# Patient Record
Sex: Female | Born: 1965 | Race: White | Hispanic: No | Marital: Married | Smoking: Former smoker
Health system: Southern US, Community
[De-identification: ages and names within clinical notes are randomized; demographics above are authoritative.]

## PROBLEM LIST (undated history)

## (undated) DIAGNOSIS — I479 Paroxysmal tachycardia, unspecified: Secondary | ICD-10-CM

## (undated) DIAGNOSIS — F419 Anxiety disorder, unspecified: Secondary | ICD-10-CM

## (undated) DIAGNOSIS — E785 Hyperlipidemia, unspecified: Secondary | ICD-10-CM

## (undated) DIAGNOSIS — T7840XA Allergy, unspecified, initial encounter: Secondary | ICD-10-CM

## (undated) DIAGNOSIS — K219 Gastro-esophageal reflux disease without esophagitis: Secondary | ICD-10-CM

## (undated) DIAGNOSIS — Z86010 Personal history of colonic polyps: Secondary | ICD-10-CM

## (undated) DIAGNOSIS — R739 Hyperglycemia, unspecified: Secondary | ICD-10-CM

## (undated) HISTORY — PX: OTHER SURGICAL HISTORY: SHX169

## (undated) HISTORY — DX: Personal history of colonic polyps: Z86.010

## (undated) HISTORY — DX: Paroxysmal tachycardia, unspecified: I47.9

## (undated) HISTORY — DX: Gastro-esophageal reflux disease without esophagitis: K21.9

## (undated) HISTORY — DX: Allergy, unspecified, initial encounter: T78.40XA

## (undated) HISTORY — DX: Hyperlipidemia, unspecified: E78.5

## (undated) HISTORY — DX: Anxiety disorder, unspecified: F41.9

## (undated) HISTORY — DX: Hyperglycemia, unspecified: R73.9

---

## 1997-05-26 ENCOUNTER — Other Ambulatory Visit: Admission: RE | Admit: 1997-05-26 | Discharge: 1997-05-26 | Payer: Self-pay | Admitting: Obstetrics & Gynecology

## 1998-06-27 ENCOUNTER — Other Ambulatory Visit: Admission: RE | Admit: 1998-06-27 | Discharge: 1998-06-27 | Payer: Self-pay | Admitting: Obstetrics & Gynecology

## 2000-02-17 ENCOUNTER — Other Ambulatory Visit: Admission: RE | Admit: 2000-02-17 | Discharge: 2000-02-17 | Payer: Self-pay | Admitting: Obstetrics & Gynecology

## 2002-01-20 HISTORY — PX: ABDOMINAL HYSTERECTOMY: SHX81

## 2002-02-22 ENCOUNTER — Other Ambulatory Visit: Admission: RE | Admit: 2002-02-22 | Discharge: 2002-02-22 | Payer: Self-pay | Admitting: Obstetrics & Gynecology

## 2002-05-06 ENCOUNTER — Inpatient Hospital Stay (HOSPITAL_COMMUNITY): Admission: RE | Admit: 2002-05-06 | Discharge: 2002-05-08 | Payer: Self-pay | Admitting: Obstetrics & Gynecology

## 2002-05-06 ENCOUNTER — Encounter (INDEPENDENT_AMBULATORY_CARE_PROVIDER_SITE_OTHER): Payer: Self-pay

## 2006-10-08 ENCOUNTER — Ambulatory Visit: Payer: Self-pay | Admitting: Internal Medicine

## 2006-11-19 ENCOUNTER — Ambulatory Visit: Payer: Self-pay | Admitting: Internal Medicine

## 2006-12-31 ENCOUNTER — Ambulatory Visit: Payer: Self-pay | Admitting: Internal Medicine

## 2008-01-21 DIAGNOSIS — Z860100 Personal history of colon polyps, unspecified: Secondary | ICD-10-CM

## 2008-01-21 HISTORY — DX: Personal history of colon polyps, unspecified: Z86.0100

## 2008-01-21 HISTORY — DX: Personal history of colonic polyps: Z86.010

## 2008-09-14 ENCOUNTER — Encounter (INDEPENDENT_AMBULATORY_CARE_PROVIDER_SITE_OTHER): Payer: Self-pay | Admitting: *Deleted

## 2008-09-27 ENCOUNTER — Ambulatory Visit: Payer: Self-pay | Admitting: Internal Medicine

## 2008-09-27 ENCOUNTER — Telehealth: Payer: Self-pay | Admitting: Internal Medicine

## 2008-09-27 DIAGNOSIS — J309 Allergic rhinitis, unspecified: Secondary | ICD-10-CM | POA: Insufficient documentation

## 2008-09-27 DIAGNOSIS — J45909 Unspecified asthma, uncomplicated: Secondary | ICD-10-CM

## 2008-09-27 DIAGNOSIS — R739 Hyperglycemia, unspecified: Secondary | ICD-10-CM | POA: Insufficient documentation

## 2008-09-27 DIAGNOSIS — E782 Mixed hyperlipidemia: Secondary | ICD-10-CM

## 2008-09-28 ENCOUNTER — Encounter (INDEPENDENT_AMBULATORY_CARE_PROVIDER_SITE_OTHER): Payer: Self-pay | Admitting: *Deleted

## 2008-09-28 LAB — CONVERTED CEMR LAB
ALT: 19 units/L (ref 0–35)
AST: 22 units/L (ref 0–37)
Albumin: 4.1 g/dL (ref 3.5–5.2)
Alkaline Phosphatase: 70 units/L (ref 39–117)
BUN: 9 mg/dL (ref 6–23)
Basophils Absolute: 0 10*3/uL (ref 0.0–0.1)
Basophils Relative: 0.2 % (ref 0.0–3.0)
Bilirubin, Direct: 0 mg/dL (ref 0.0–0.3)
CO2: 29 meq/L (ref 19–32)
Calcium: 9.1 mg/dL (ref 8.4–10.5)
Chloride: 105 meq/L (ref 96–112)
Creatinine, Ser: 0.7 mg/dL (ref 0.4–1.2)
Eosinophils Absolute: 0.3 10*3/uL (ref 0.0–0.7)
Eosinophils Relative: 3 % (ref 0.0–5.0)
GFR calc non Af Amer: 96.68 mL/min (ref >60)
Glucose, Bld: 80 mg/dL (ref 70–99)
HCT: 45.2 % (ref 36.0–46.0)
Hemoglobin: 15.5 g/dL — ABNORMAL HIGH (ref 12.0–15.0)
Hgb A1c MFr Bld: 5.9 % (ref 4.6–6.5)
Lymphocytes Relative: 20.8 % (ref 12.0–46.0)
Lymphs Abs: 2.2 10*3/uL (ref 0.7–4.0)
MCHC: 34.2 g/dL (ref 30.0–36.0)
MCV: 95.3 fL (ref 78.0–100.0)
Monocytes Absolute: 0.4 10*3/uL (ref 0.1–1.0)
Monocytes Relative: 4.1 % (ref 3.0–12.0)
Neutro Abs: 7.6 10*3/uL (ref 1.4–7.7)
Neutrophils Relative %: 71.9 % (ref 43.0–77.0)
Platelets: 341 10*3/uL (ref 150.0–400.0)
Potassium: 4.2 meq/L (ref 3.5–5.1)
RBC: 4.75 M/uL (ref 3.87–5.11)
RDW: 13.9 % (ref 11.5–14.6)
Sodium: 141 meq/L (ref 135–145)
TSH: 0.46 microintl units/mL (ref 0.35–5.50)
Total Bilirubin: 0.7 mg/dL (ref 0.3–1.2)
Total Protein: 7.6 g/dL (ref 6.0–8.3)
WBC: 10.5 10*3/uL (ref 4.5–10.5)

## 2008-10-03 ENCOUNTER — Encounter: Payer: Self-pay | Admitting: Internal Medicine

## 2008-10-09 ENCOUNTER — Telehealth (INDEPENDENT_AMBULATORY_CARE_PROVIDER_SITE_OTHER): Payer: Self-pay | Admitting: *Deleted

## 2008-10-10 ENCOUNTER — Telehealth (INDEPENDENT_AMBULATORY_CARE_PROVIDER_SITE_OTHER): Payer: Self-pay | Admitting: *Deleted

## 2008-11-13 ENCOUNTER — Encounter: Payer: Self-pay | Admitting: Internal Medicine

## 2008-11-13 ENCOUNTER — Emergency Department (HOSPITAL_BASED_OUTPATIENT_CLINIC_OR_DEPARTMENT_OTHER): Admission: EM | Admit: 2008-11-13 | Discharge: 2008-11-14 | Payer: Self-pay | Admitting: Emergency Medicine

## 2008-11-14 ENCOUNTER — Ambulatory Visit: Payer: Self-pay | Admitting: Diagnostic Radiology

## 2008-11-14 ENCOUNTER — Encounter: Payer: Self-pay | Admitting: Internal Medicine

## 2008-11-15 ENCOUNTER — Telehealth: Payer: Self-pay | Admitting: Internal Medicine

## 2008-11-17 ENCOUNTER — Ambulatory Visit: Payer: Self-pay | Admitting: Internal Medicine

## 2008-11-17 DIAGNOSIS — K625 Hemorrhage of anus and rectum: Secondary | ICD-10-CM | POA: Insufficient documentation

## 2008-11-17 DIAGNOSIS — N83209 Unspecified ovarian cyst, unspecified side: Secondary | ICD-10-CM

## 2008-11-17 DIAGNOSIS — D72829 Elevated white blood cell count, unspecified: Secondary | ICD-10-CM | POA: Insufficient documentation

## 2008-11-20 ENCOUNTER — Ambulatory Visit: Payer: Self-pay | Admitting: Internal Medicine

## 2008-11-20 HISTORY — PX: COLONOSCOPY W/ POLYPECTOMY: SHX1380

## 2008-11-21 ENCOUNTER — Encounter (INDEPENDENT_AMBULATORY_CARE_PROVIDER_SITE_OTHER): Payer: Self-pay | Admitting: *Deleted

## 2008-11-21 LAB — CONVERTED CEMR LAB
Basophils Absolute: 0.2 10*3/uL — ABNORMAL HIGH (ref 0.0–0.1)
Basophils Relative: 1.5 % (ref 0.0–3.0)
Eosinophils Absolute: 0.4 10*3/uL (ref 0.0–0.7)
Lymphocytes Relative: 20.9 % (ref 12.0–46.0)
MCHC: 33.8 g/dL (ref 30.0–36.0)
Monocytes Relative: 4.3 % (ref 3.0–12.0)
Neutrophils Relative %: 69.9 % (ref 43.0–77.0)
RBC: 4.81 M/uL (ref 3.87–5.11)
RDW: 14.4 % (ref 11.5–14.6)

## 2008-11-27 ENCOUNTER — Telehealth (INDEPENDENT_AMBULATORY_CARE_PROVIDER_SITE_OTHER): Payer: Self-pay | Admitting: *Deleted

## 2008-12-06 ENCOUNTER — Ambulatory Visit (HOSPITAL_COMMUNITY): Admission: RE | Admit: 2008-12-06 | Discharge: 2008-12-06 | Payer: Self-pay | Admitting: Gastroenterology

## 2009-01-03 ENCOUNTER — Encounter: Payer: Self-pay | Admitting: Internal Medicine

## 2009-01-31 ENCOUNTER — Encounter: Payer: Self-pay | Admitting: Internal Medicine

## 2009-02-13 ENCOUNTER — Ambulatory Visit: Payer: Self-pay | Admitting: Internal Medicine

## 2009-02-13 HISTORY — PX: WISDOM TOOTH EXTRACTION: SHX21

## 2009-02-16 LAB — CONVERTED CEMR LAB
ALT: 20 units/L (ref 0–35)
AST: 21 units/L (ref 0–37)
Albumin: 3.9 g/dL (ref 3.5–5.2)
HDL: 37.9 mg/dL — ABNORMAL LOW (ref >39.00)
Triglycerides: 152 mg/dL — ABNORMAL HIGH (ref 0.0–149.0)

## 2009-02-19 ENCOUNTER — Encounter (INDEPENDENT_AMBULATORY_CARE_PROVIDER_SITE_OTHER): Payer: Self-pay | Admitting: *Deleted

## 2009-02-19 ENCOUNTER — Ambulatory Visit: Payer: Self-pay | Admitting: Internal Medicine

## 2009-02-19 LAB — CONVERTED CEMR LAB: Inflenza A Ag: NEGATIVE

## 2009-02-20 ENCOUNTER — Ambulatory Visit: Payer: Self-pay | Admitting: Internal Medicine

## 2009-02-20 ENCOUNTER — Telehealth: Payer: Self-pay | Admitting: Internal Medicine

## 2009-02-21 ENCOUNTER — Telehealth (INDEPENDENT_AMBULATORY_CARE_PROVIDER_SITE_OTHER): Payer: Self-pay | Admitting: *Deleted

## 2009-02-22 ENCOUNTER — Telehealth: Payer: Self-pay | Admitting: Internal Medicine

## 2009-04-16 ENCOUNTER — Telehealth (INDEPENDENT_AMBULATORY_CARE_PROVIDER_SITE_OTHER): Payer: Self-pay | Admitting: *Deleted

## 2009-06-04 ENCOUNTER — Encounter: Payer: Self-pay | Admitting: Internal Medicine

## 2009-06-11 ENCOUNTER — Ambulatory Visit: Payer: Self-pay | Admitting: Family Medicine

## 2009-06-13 ENCOUNTER — Telehealth (INDEPENDENT_AMBULATORY_CARE_PROVIDER_SITE_OTHER): Payer: Self-pay | Admitting: *Deleted

## 2009-06-13 LAB — CONVERTED CEMR LAB
Basophils Absolute: 0 10*3/uL (ref 0.0–0.1)
Chloride: 106 meq/L (ref 96–112)
Creatinine, Ser: 0.6 mg/dL (ref 0.4–1.2)
Eosinophils Absolute: 0.1 10*3/uL (ref 0.0–0.7)
Lymphocytes Relative: 42.9 % (ref 12.0–46.0)
MCHC: 33.9 g/dL (ref 30.0–36.0)
Neutrophils Relative %: 39.1 % — ABNORMAL LOW (ref 43.0–77.0)
Platelets: 304 10*3/uL (ref 150.0–400.0)
Potassium: 4.4 meq/L (ref 3.5–5.1)
RDW: 14.5 % (ref 11.5–14.6)
Sodium: 144 meq/L (ref 135–145)

## 2009-06-27 ENCOUNTER — Telehealth (INDEPENDENT_AMBULATORY_CARE_PROVIDER_SITE_OTHER): Payer: Self-pay | Admitting: *Deleted

## 2009-09-12 ENCOUNTER — Encounter: Payer: Self-pay | Admitting: Internal Medicine

## 2009-10-11 ENCOUNTER — Encounter: Payer: Self-pay | Admitting: Internal Medicine

## 2009-12-04 ENCOUNTER — Telehealth (INDEPENDENT_AMBULATORY_CARE_PROVIDER_SITE_OTHER): Payer: Self-pay | Admitting: *Deleted

## 2010-01-17 ENCOUNTER — Telehealth: Payer: Self-pay | Admitting: Internal Medicine

## 2010-01-28 ENCOUNTER — Ambulatory Visit: Admit: 2010-01-28 | Payer: Self-pay | Admitting: Internal Medicine

## 2010-02-04 ENCOUNTER — Ambulatory Visit
Admission: RE | Admit: 2010-02-04 | Discharge: 2010-02-04 | Payer: Self-pay | Source: Home / Self Care | Attending: Internal Medicine | Admitting: Internal Medicine

## 2010-02-04 ENCOUNTER — Encounter: Payer: Self-pay | Admitting: Internal Medicine

## 2010-02-04 ENCOUNTER — Other Ambulatory Visit: Payer: Self-pay | Admitting: Internal Medicine

## 2010-02-04 DIAGNOSIS — Z860100 Personal history of colon polyps, unspecified: Secondary | ICD-10-CM | POA: Insufficient documentation

## 2010-02-04 DIAGNOSIS — I472 Ventricular tachycardia: Secondary | ICD-10-CM | POA: Insufficient documentation

## 2010-02-04 LAB — BASIC METABOLIC PANEL
BUN: 5 mg/dL — ABNORMAL LOW (ref 6–23)
CO2: 26 mEq/L (ref 19–32)
Calcium: 8.9 mg/dL (ref 8.4–10.5)
Chloride: 102 mEq/L (ref 96–112)
Creatinine, Ser: 0.7 mg/dL (ref 0.4–1.2)
GFR: 96.08 mL/min (ref >60.00)
Glucose, Bld: 95 mg/dL (ref 70–99)
Potassium: 4 mEq/L (ref 3.5–5.1)
Sodium: 135 mEq/L (ref 135–145)

## 2010-02-04 LAB — CBC WITH DIFFERENTIAL/PLATELET
Basophils Absolute: 0 10*3/uL (ref 0.0–0.1)
Basophils Relative: 0 % (ref 0.0–3.0)
Eosinophils Absolute: 0.2 10*3/uL (ref 0.0–0.7)
Eosinophils Relative: 2 % (ref 0.0–5.0)
HCT: 46.8 % — ABNORMAL HIGH (ref 36.0–46.0)
Hemoglobin: 15.7 g/dL — ABNORMAL HIGH (ref 12.0–15.0)
Lymphocytes Relative: 18.4 % (ref 12.0–46.0)
Lymphs Abs: 2.1 10*3/uL (ref 0.7–4.0)
MCHC: 33.6 g/dL (ref 30.0–36.0)
MCV: 97.2 fl (ref 78.0–100.0)
Monocytes Absolute: 0.6 10*3/uL (ref 0.1–1.0)
Monocytes Relative: 5.4 % (ref 3.0–12.0)
Neutro Abs: 8.6 10*3/uL — ABNORMAL HIGH (ref 1.4–7.7)
Neutrophils Relative %: 74.2 % (ref 43.0–77.0)
Platelets: 325 10*3/uL (ref 150.0–400.0)
RBC: 4.81 Mil/uL (ref 3.87–5.11)
RDW: 14 % (ref 11.5–14.6)
WBC: 11.6 10*3/uL — ABNORMAL HIGH (ref 4.5–10.5)

## 2010-02-04 LAB — LIPID PANEL
Cholesterol: 191 mg/dL (ref 0–200)
HDL: 36 mg/dL — ABNORMAL LOW (ref >39.00)
LDL Cholesterol: 118 mg/dL — ABNORMAL HIGH (ref 0–99)
Total CHOL/HDL Ratio: 5
Triglycerides: 185 mg/dL — ABNORMAL HIGH (ref 0.0–149.0)
VLDL: 37 mg/dL (ref 0.0–40.0)

## 2010-02-04 LAB — HEPATIC FUNCTION PANEL
ALT: 17 U/L (ref 0–35)
AST: 24 U/L (ref 0–37)
Albumin: 4 g/dL (ref 3.5–5.2)
Alkaline Phosphatase: 52 U/L (ref 39–117)
Bilirubin, Direct: 0.1 mg/dL (ref 0.0–0.3)
Total Bilirubin: 0.4 mg/dL (ref 0.3–1.2)
Total Protein: 6.7 g/dL (ref 6.0–8.3)

## 2010-02-04 LAB — TSH: TSH: 0.71 u[IU]/mL (ref 0.35–5.50)

## 2010-02-11 ENCOUNTER — Ambulatory Visit
Admission: RE | Admit: 2010-02-11 | Discharge: 2010-02-11 | Payer: Self-pay | Source: Home / Self Care | Attending: Internal Medicine | Admitting: Internal Medicine

## 2010-02-11 DIAGNOSIS — B029 Zoster without complications: Secondary | ICD-10-CM | POA: Insufficient documentation

## 2010-02-11 DIAGNOSIS — D485 Neoplasm of uncertain behavior of skin: Secondary | ICD-10-CM | POA: Insufficient documentation

## 2010-02-19 ENCOUNTER — Telehealth (INDEPENDENT_AMBULATORY_CARE_PROVIDER_SITE_OTHER): Payer: Self-pay | Admitting: *Deleted

## 2010-02-21 NOTE — Letter (Signed)
Summary: [facility]  [facility]   Imported By: Lanelle Bal 06/23/2009 09:49:23  _____________________________________________________________________  External Attachment:    Type:   Image     Comment:   External Document

## 2010-02-21 NOTE — Progress Notes (Signed)
Summary: Refill Request  Phone Note Refill Request Call back at [phone] Message from:  Pharmacy on June 27, 2009 8:48 AM  Refills Requested: Medication #1:  CLONAZEPAM 0.5 MG TBDP 1/2 by mouth as needed ANXIETY   Dosage confirmed as above?Dosage Confirmed   Supply Requested: 1 month   [facility]  Next Appointment Scheduled: none Initial call taken by: Harold Barban,  June 27, 2009 8:49 AM    Prescriptions: CLONAZEPAM 0.5 MG TBDP (CLONAZEPAM) 1/2 by mouth as needed ANXIETY  #90 x 0   Entered by:   Shonna Chock   Authorized by:   Marga Melnick MD   Signed by:   Shonna Chock on 06/27/2009   Method used:   Printed then faxed to ...       [facility] [phone]* (retail)       [address]       [place], [place]  [address]       Ph: [phone]       Fax: [phone]   RxID:   [id number]

## 2010-02-21 NOTE — Letter (Signed)
Summary: [facility]  [facility]   Imported By: Lanelle Bal 09/25/2009 11:11:56  _____________________________________________________________________  External Attachment:    Type:   Image     Comment:   External Document

## 2010-02-21 NOTE — Progress Notes (Signed)
Summary: Chest XRay results  Phone Note Call from Patient Call back at Home Phone [phone]   Caller: Patient Summary of Call: Message left on VM: Patient would like Xray results Spoke with patient and discussed: Changes of bronchitis; hyperinflation from asthma mimics COPD / emphysema.No Pneumonia .Prednisone 20 mg two times a day #14 if asthma not resolving. Hopp  Patient still with asthma SX, RX for prednisone sent. Patient said she also has a nebulizer but no meds Please ask Dr.Hopper is he will dispense meds. Per Dr.Hopper: Geronimo Boot 1amp every 4 hours as needed    [facility] [address] Initial call taken by: Shonna Chock,  February 21, 2009 3:58 PM    New/Updated Medications: DUONEB 0.5-2.5 (3) MG/3ML SOLN (IPRATROPIUM-ALBUTEROL) 1 Ampule every 4 hours as needed PREDNISONE 20 MG TABS (PREDNISONE) 20 mg two times a day Prescriptions: PREDNISONE 20 MG TABS (PREDNISONE) 20 mg two times a day  #14 x 0   Entered by:   Shonna Chock   Authorized by:   Marga Melnick MD   Signed by:   Shonna Chock on 02/21/2009   Method used:   Electronically to        [facility] [phone]* (retail)       [address]       [place], [place]  [address]       Ph: [phone]       Fax: [phone]   RxID:   [id number] DUONEB 0.5-2.5 (3) MG/3ML SOLN (IPRATROPIUM-ALBUTEROL) 1 Ampule every 4 hours as needed  #51mo supply x 2   Entered by:   Shonna Chock   Authorized by:   Marga Melnick MD   Signed by:   Shonna Chock on 02/21/2009   Method used:   Electronically to        [facility] [phone]* (retail)       [address]       [place], [place]  [address]       Ph: [phone]       Fax: [phone]   RxID:   [id number]

## 2010-02-21 NOTE — Assessment & Plan Note (Signed)
Summary: shingles//lch   Vital Signs:  Patient profile:   45 year old female Weight:      178 pounds BMI:     30.43 Temp:     98.5 degrees F oral Pulse rate:   72 / minute Resp:     15 per minute BP sitting:   134 / 88  (left arm) Cuff size:   large  Vitals Entered By: Shonna Chock CMA (February 11, 2010 2:47 PM) CC: 1.) ? shingles, patient c/o burning sensation (right pelvic area), onset Saturday and by Sunday sensation moved around to right buttock area and now patient notices spots (right pelvic area).  2.) Examine 2 spots on right leg (non-related to concern #1)    CC:  1.) ? shingles, patient c/o burning sensation (right pelvic area), and onset Saturday and by Sunday sensation moved around to right buttock area and now patient notices spots (right pelvic area).  2.) Examine 2 spots on right leg (non-related to concern #1) .  History of Present Illness:    Rash as "bumps " today  in R pubic arepubic area . The initial symptom was  burning 01/21 in R  inguinal & pelvic area and in R thigh & R LS area. Lesions on leg present for years, both  lesions intermittently scale over . Both have enlarged over past year.  No FH or PMH of  skin cancer.  Current Medications (verified): 1)  Atenolol 50 Mg Tabs (Atenolol) .Marland Kitchen.. 1 By Mouth Two Times A Day 2)  Citalopram Hydrobromide 20 Mg Tabs (Citalopram Hydrobromide) .Marland Kitchen.. 1 By Mouth Am 3)  Proair Hfa 108 (90 Base) Mcg/act Aers (Albuterol Sulfate) .... As Needed 4)  Clonazepam 0.5 Mg Tbdp (Clonazepam) .... 1/2-1 By Mouth Every 8-12 Hours As Needed 5)  Pravastatin Sodium 20 Mg Tabs (Pravastatin Sodium) .Marland Kitchen.. 1 At Bedtime 6)  Hydrocodone-Acetaminophen 10-325 Mg Tabs (Hydrocodone-Acetaminophen) .Marland Kitchen.. 1 By Mouth Every 4-6 Hours As Needed 7)  Duoneb 0.5-2.5 (3) Mg/8ml Soln (Ipratropium-Albuterol) .Marland Kitchen.. 1 Ampule Every 4 Hours As Needed  Allergies (verified): No Known Drug Allergies  Review of Systems General:  Denies chills, fever, sweats, and weight  loss. Derm:  Complains of dryness; denies itching.  Physical Exam  General:  well-nourished,in no acute distress; alert  and cooperative throughout examination Skin:  faint small papules R pubic area . 10X 8 mm granuloma & 5X4 mm keratosis R thigh Inguinal Nodes:  No significant adenopathy   Impression & Recommendations:  Problem # 1:  HERPES ZOSTER (ICD-053.9) R L-1 distribution; pathophysiology of herpes zoster  Problem # 2:  NEOPLASM, SKIN, UNCERTAIN BEHAVIOR (ICD-238.2)  Granuloma & keratosis suggested clinically  Orders: Dermatology Referral (Derma)  Complete Medication List: 1)  Atenolol 50 Mg Tabs (Atenolol) .Marland Kitchen.. 1 by mouth two times a day 2)  Citalopram Hydrobromide 20 Mg Tabs (Citalopram hydrobromide) .Marland Kitchen.. 1 by mouth am 3)  Proair Hfa 108 (90 Base) Mcg/act Aers (Albuterol sulfate) .... As needed 4)  Clonazepam 0.5 Mg Tbdp (Clonazepam) .... 1/2-1 by mouth every 8-12 hours as needed 5)  Pravastatin Sodium 20 Mg Tabs (Pravastatin sodium) .Marland Kitchen.. 1 at bedtime 6)  Hydrocodone-acetaminophen 10-325 Mg Tabs (Hydrocodone-acetaminophen) .Marland Kitchen.. 1 by mouth every 4-6 hours as needed 7)  Duoneb 0.5-2.5 (3) Mg/64ml Soln (Ipratropium-albuterol) .Marland Kitchen.. 1 ampule every 4 hours as needed 8)  Valacyclovir Hcl 500 Mg Tabs (Valacyclovir hcl) .Marland Kitchen.. 1 three times a day 9)  Gabapentin 100 Mg Caps (Gabapentin) .Marland Kitchen.. 1 every 8 hrs as needed for burning pain  Patient Instructions: 1)  Simple cleansing of lesions in groin. Prescriptions: GABAPENTIN 100 MG CAPS (GABAPENTIN) 1 every 8 hrs as needed for burning pain  #30 x 1   Entered and Authorized by:   Marga Melnick MD   Signed by:   Marga Melnick MD on 02/11/2010   Method used:   Print then Give to Patient   RxID:   [id number] VALACYCLOVIR HCL 500 MG TABS (VALACYCLOVIR HCL) 1 three times a day  #21 x 0   Entered and Authorized by:   Marga Melnick MD   Signed by:   Marga Melnick MD on 02/11/2010   Method used:   Electronically to        [facility] [phone]* (retail)       [address]       [place], [place]  [address]       Ph: [phone]       Fax: [phone]   RxID:   [id number]    Orders Added: 1)  Est. Patient Level III [63[address]] 2)  Dermatology Referral [Derma]

## 2010-02-21 NOTE — Assessment & Plan Note (Signed)
Summary: FLU LIKE SYMPTOMS/RH......Marland Kitchen   Vital Signs:  Patient profile:   45 year old female Weight:      180.2 pounds Temp:     99.3 degrees F oral Pulse rate:   88 / minute Resp:     15 per minute BP sitting:   124 / 82  (left arm) Cuff size:   large  Vitals Entered By: Shonna Chock (February 19, 2009 2:59 PM) CC: Fever and cough (pain due to cough). Patient's daughter seen the PA this am and was DX with the flu Comments REVIEWED MED LIST, PATIENT AGREED DOSE AND INSTRUCTION CORRECT    CC:  Fever and cough (pain due to cough). Patient's daughter seen the PA this am and was DX with the flu.  History of Present Illness: Onset 02/18/18 as dry cough but by night  scant yellow sputum after   paroxysms  of  cough. Daughter had + flu  test 01/28;husband hospitalized with gastroenteritis 01/29-30. Now she has  fever , back pain  with cough, wheezing & cough incontinence. PMH of asthma; Symbicort 1 puff two times a day , no rescue inhaler. No flu shot taken to date.                                                                                                         Lipid  results & prior NMR Lipoprofile  reviewed; approx 50 % reduction in risk with diet changes & statin.  Allergies (verified): No Known Drug Allergies  Past History:  Past Medical History: Paroxysmal tachycardia, Dr Graciela Husbands  Asthma, cat & dust are triggers Allergic rhinitis Hyperglycemia, borderline Hyperlipidemia, borderline: NMR Lipopoprofile : LDL 165(2790/2167), TG 255, HDL 29  Past Surgical History: G2 P 2 Hysterectomy 2004 for abnormal PAP &  painful periods, Dr Arlyce Dice Wisdom Teeth Extraction; ESI , Dr Ethelene Hal , 02/13/2009  Review of Systems General:  Complains of fever and sweats; denies chills. Eyes:  Denies discharge. ENT:  Denies earache, nasal congestion, and sinus pressure; No purulence; loose rhinitis. . CV:  Complains of difficulty breathing at night and difficulty breathing while lying down; Awakening @  4 am. Resp:  Complains of chest pain with inspiration; denies coughing up blood. 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  Physical Exam  General:  well-nourished,in no acute distress but  mildly ill ; alert,appropriate and cooperative throughout examination Eyes:  ? xanthelasma OS upper lid Ears:  External ear exam shows no significant lesions or deformities.  Otoscopic examination reveals clear canals, tympanic membranes are intact bilaterally without bulging, retraction, inflammation or discharge. Hearing is grossly normal bilaterally. Nose:  External nasal examination shows no deformity or inflammation. Nasal mucosa are  boggy without lesions or exudates. Mouth:  Oral mucosa and oropharynx without lesions or exudates.  Teeth in good repair. Lungs:  Normal respiratory effort, chest expands symmetrically. Lungs : decreased BS with scattered wheezing Heart:  Normal rate and regular rhythm. S1 and S2 normal without gallop, murmur, click, rub. S4  Skin:  Intact without suspicious lesions or rashes. Damp Cervical Nodes:  No lymphadenopathy noted Axillary Nodes:  No palpable lymphadenopathy Psych:  memory intact for recent and remote, normally interactive, and good eye contact.     Impression & Recommendations:  Problem # 1:  BRONCHITIS-ACUTE (ICD-466.0)  Her updated medication list for this problem includes:    Proair Hfa 108 (90 Base) Mcg/act Aers (Albuterol sulfate) .Marland Kitchen... As needed    Symbicort 80-4.5 Mcg/act Aero (Budesonide-formoterol fumarate) .Marland Kitchen... 1 -2  puffs two times a day as needed ; gargle after use & swallow    Guaifenesin 400 Mg Tabs (Guaifenesin) .Marland Kitchen... 1 by mouth every 4 hours    Azithromycin 250 Mg Tabs (Azithromycin) .Marland Kitchen... As per pack  Orders: Flu A+B ([address])  Problem # 2:  ASTHMA NOS W/ACUTE EXACERBATION (ICD-493.92)  Her updated medication list for this problem includes:    Proair Hfa 108 (90 Base)  Mcg/act Aers (Albuterol sulfate) .Marland Kitchen... As needed    Symbicort 80-4.5 Mcg/act Aero (Budesonide-formoterol fumarate) .Marland Kitchen... 1 -2  puffs two times a day as needed ; gargle after use & swallow  Orders: Flu A+B (60[address])  Problem # 3:  HYPERLIPIDEMIA (ICD-272.4) Dramatic improvement Her updated medication list for this problem includes:    Pravastatin Sodium 20 Mg Tabs (Pravastatin sodium) .Marland Kitchen... 1 at bedtime  Complete Medication List: 1)  Atenolol 50 Mg Tabs (Atenolol) .... 1/2 in am, 1/2 in pm 2)  Citalopram Hydrobromide 20 Mg Tabs (Citalopram hydrobromide) .Marland Kitchen.. 1 by mouth am 3)  Proair Hfa 108 (90 Base) Mcg/act Aers (Albuterol sulfate) .... As needed 4)  Clonazepam 0.5 Mg Tbdp (Clonazepam) .... 1/2 by mouth as needed anxiety 5)  Symbicort 80-4.5 Mcg/act Aero (Budesonide-formoterol fumarate) .Marland Kitchen.. 1 -2  puffs two times a day as needed ; gargle after use & swallow 6)  Pravastatin Sodium 20 Mg Tabs (Pravastatin sodium) .Marland Kitchen.. 1 at bedtime 7)  Hydrocodone-acetaminophen 7.5-325 Mg Tabs (Hydrocodone-acetaminophen) .Marland Kitchen.. 1 by mouth every 6 hours as needed 8)  Guaifenesin 400 Mg Tabs (Guaifenesin) .Marland Kitchen.. 1 by mouth every 4 hours 9)  Ibuprofen 600 Mg Tabs (Ibuprofen) .Marland Kitchen.. 1 by mouth 4-6 hours 10)  Azithromycin 250 Mg Tabs (Azithromycin) .... As per pack  Patient Instructions: 1)  Symbicort 2 puffs two times a day until stable. Ventolin HFA 1-2 puffs every 4 hrs as needed  (samples of both provided). 2)  Drink as much fluid as you can tolerate for the next few days. 3)  NMR Lipoprofile Lipid Panel  in 6 months(272.4) Prescriptions: AZITHROMYCIN 250 MG TABS (AZITHROMYCIN) as per pack  #1 x 0   Entered and Authorized by:   Marga Melnick MD   Signed by:   Marga Melnick MD on 02/19/2009   Method used:   Print then Give to Patient   RxID:   [id number] PRAVASTATIN SODIUM 20 MG TABS (PRAVASTATIN SODIUM) 1 at bedtime  #90 x 3   Entered and Authorized by:   Marga Melnick MD   Signed by:   Marga Melnick  MD on 02/19/2009   Method used:   Print then Give to Patient   RxID:   [id number]   Laboratory Results    Other Tests  Influenza A: negative Influenza B: negative

## 2010-02-21 NOTE — Letter (Signed)
Summary: [facility]  [facility]   Imported By: Lanelle Bal 10/22/2009 09:26:13  _____________________________________________________________________  External Attachment:    Type:   Image     Comment:   External Document

## 2010-02-21 NOTE — Progress Notes (Signed)
Summary: chest pain  Phone Note Call from Patient   Caller: Patient Summary of Call: pt c/o pain with inhaling and exhaling and movement,rattling coughing. pt states that she is very worried about pneumonia. pt denies fever, nausea, vomiting,dizziness today. pt was seen on yesterday and rx z-pak. dr Analis Distler pls advise................Marland KitchenFelecia Deloach CMA  February 20, 2009 2:57 PM   Follow-up for Phone Call        [facility] @ [facility] (511.0) Follow-up by: Marga Melnick MD,  February 20, 2009 3:23 PM  Additional Follow-up for Phone Call Additional follow up Details #1::        pt aware order put in..................................Marland KitchenFelecia Deloach CMA  February 20, 2009 3:33 PM   New Problems: PLEURISY (ICD-511.0)   New Problems: PLEURISY (ICD-511.0)

## 2010-02-21 NOTE — Progress Notes (Signed)
Summary: Refill Request  Phone Note Refill Request Call back at Work Phone [phone] Message from:  Patient  Refills Requested: Medication #1:  ATENOLOL 50 MG TABS 1/2 IN AM  Method Requested: Fax to Local Pharmacy Initial call taken by: Shonna Chock,  April 16, 2009 2:57 PM    Prescriptions: ATENOLOL 50 MG TABS (ATENOLOL) 1/2 IN AM, 1/2 IN PM  #90 x 1   Entered by:   Shonna Chock   Authorized by:   Marga Melnick MD   Signed by:   Shonna Chock on 04/16/2009   Method used:   Electronically to        [facility] [phone]* (retail)       [address]       [place], [place]  [address]       Ph: [phone]       Fax: [phone]   RxID:   [id number]

## 2010-02-21 NOTE — Progress Notes (Signed)
Summary: labs  Phone Note Outgoing Call   Call placed by: Doristine Devoid,  Jun 13, 2009 3:22 PM Call placed to: Patient Summary of Call: CBC consistent w/ viral illness, pt's sxs should be improving.  please call and check on her.  Follow-up for Phone Call        left message on machine .......Marland KitchenDoristine Devoid  Jun 13, 2009 3:22 PM   spoke w/ patient says she is feeling better and that some other members have started feeling the same symptoms informed patient that she needs to continue bland diet as tolerated and that she should make sure to clean everything down w/ lysol and drink plenty of fluids and the virus will have to just run it's course.........Marland KitchenDoristine Devoid  Jun 14, 2009 3:32 PM

## 2010-02-21 NOTE — Progress Notes (Signed)
Summary: Clonazepam Request  Phone Note Refill Request Message from:  Patient on January 17, 2010 2:29 PM  Refills Requested: Medication #1:  CLONAZEPAM 0.5 MG TBDP 1/2 by mouth as needed ANXIETY   Last Refilled: 06/27/2009 rcv'd message from patient wanting to know if she can get a refill and wants an increased quantity due to having to take more meds, because her father is dying from Cancer and she has had to use more to cope. C/B # [id number]  Next Appointment Scheduled: 1.16.12 Initial call taken by: Almeta Monas CMA Duncan Dull),  January 17, 2010 2:29 PM  Follow-up for Phone Call        [facility] Triage Call Report Triage Record Num: [id number] Operator: Albertine Grates Patient Name: Amanda [name] Call Date & Time: 01/17/2010 6:46:08PM Patient Phone: [phone] PCP: Marga Melnick Patient Gender: Female PCP Fax : [phone] Patient DOB: 05/11/1965 Practice Name: [facility] Reason for Call: [facility] and states pt. is out of Clonazepam 0.5mg  and has appointment "in 2 weeks". Is wanting enough meds until appointment. Advised follow up with office 12-30. Protocol(s) Used: Medication Question Calls, No Triage (Adults) Recommended Outcome per Protocol: Call Provider within 24 Hours Reason for Outcome: Caller requesting a non urgent new prescription or refill and triager unable to refill per unit policy Care Advice:  ~   Additional Follow-up for Phone Call Additional follow up Details #1::        Hopp please advise, patient is requesting increased quanity due to coping with father/cancer  **Note printed and placed on ledge for quicker review** Additional Follow-up by: Shonna Chock CMA,  January 18, 2010 8:55 AM    New/Updated Medications: CLONAZEPAM 0.5 MG TBDP (CLONAZEPAM) 1/2-1 by mouth every 8-12 hours Prescriptions: CLONAZEPAM 0.5 MG TBDP (CLONAZEPAM) 1/2-1 by mouth every 8-12 hours  #30 x 0   Entered by:   Shonna Chock CMA   Authorized by:    Marga Melnick MD   Signed by:   Shonna Chock CMA on 01/18/2010   Method used:   Printed then faxed to ...       [facility] [phone]* (retail)       [address]       [place], [place]  [address]       Ph: [phone]       Fax: [phone]   RxID:   [id number]

## 2010-02-21 NOTE — Letter (Signed)
Summary: [facility]  [facility]   Imported By: Lanelle Bal 02/15/2009 16:00:01  _____________________________________________________________________  External Attachment:    Type:   Image     Comment:   External Document

## 2010-02-21 NOTE — Progress Notes (Signed)
Summary: need diag code and orders for lab=01/31/2010  Phone Note Call from Patient   Caller: Patient Summary of Call: has CPX for 02/04/2010----has labs for 01-31-10    what orders and diag codes do you need??  thanks Initial call taken by: Jerolyn Shin,  December 04, 2009 3:37 PM  Follow-up for Phone Call        V70.0/272.4/995.20  Lipid,Hep,BMP,CBCD,TSH, Stool Cards Follow-up by: Shonna Chock CMA,  December 04, 2009 3:46 PM  Additional Follow-up for Phone Call Additional follow up Details #1::        Added codes and orders for 01-31-2010 labs Additional Follow-up by: Jerolyn Shin,  December 04, 2009 5:01 PM

## 2010-02-21 NOTE — Progress Notes (Signed)
Summary: thrush in mouth  Phone Note Call from Patient Call back at [facility] Phone [phone]   Caller: Patient Complaint: Breathing Problems Summary of Call: PT STATES THAT SHE NOW HAS THRUSH IN HER MOUTH DUE TO THE MED. PT WOULD LIKE TO GET A RX FOR THIS OR ANY HOME REMEDIES THAT CAN BE SUGGESTED AS WELL. PT HAS INCREASE YOGURT INTAKE AND WILL BE RINSING WITH WARM SALT WATER......................Marland KitchenFelecia Deloach CMA  February 22, 2009 1:22 PM   Follow-up for Phone Call        per dr hopper MAGIC MOUTHWASH 5cc gargle well three times a day and swallow. pt aware rx sent to pharmacy................Marland KitchenFelecia Deloach CMA  February 22, 2009 1:40 PM     New/Updated Medications: * MAGIC MOUTHWASH 5cc gargle well three times a day and swallow Prescriptions: MAGIC MOUTHWASH 5cc gargle well three times a day and swallow  #90cc x 0   Entered by:   Jeremy Johann CMA   Authorized by:   Marga Melnick MD   Signed by:   Jeremy Johann CMA on 02/22/2009   Method used:   Telephoned to ...       [facility] [phone]* (retail)       [address]       [place], [place]  [address]       Ph: [phone]       Fax: [phone]   RxID:   [id number]

## 2010-02-21 NOTE — Assessment & Plan Note (Signed)
Summary: cpx///sph   Vital Signs:  Patient profile:   45 year old female Height:      64.25 inches Weight:      178.4 pounds BMI:     30.49 Temp:     98.5 degrees F oral Pulse rate:   76 / minute Resp:     16 per minute BP sitting:   140 / 92  (left arm) Cuff size:   large  Vitals Entered By: Shonna Chock CMA (February 04, 2010 2:16 PM) CC: CPX and discuss labs (copy given)    CC:  CPX and discuss labs (copy given) .  History of Present Illness:    Amanda [name] is here for a physical; she  had seen  Dr Graciela Husbands for  VT in 2010. This has been exacerbated by her father's death  2010/02/23 from stage 4 colon cancer which was originally  diagnosed in 08/2008.  Current Medications (verified): 1)  Atenolol 50 Mg Tabs (Atenolol) .... 1/2 in Am, 1/2 in Pm 2)  Citalopram Hydrobromide 20 Mg Tabs (Citalopram Hydrobromide) .Marland Kitchen.. 1 By Mouth Am 3)  Proair Hfa 108 (90 Base) Mcg/act Aers (Albuterol Sulfate) .... As Needed 4)  Clonazepam 0.5 Mg Tbdp (Clonazepam) .... 1/2-1 By Mouth Every 8-12 Hours As Needed 5)  Pravastatin Sodium 20 Mg Tabs (Pravastatin Sodium) .Marland Kitchen.. 1 At Bedtime 6)  Hydrocodone-Acetaminophen 10-325 Mg Tabs (Hydrocodone-Acetaminophen) .Marland Kitchen.. 1 By Mouth Every 4-6 Hours As Needed 7)  Duoneb 0.5-2.5 (3) Mg/68ml Soln (Ipratropium-Albuterol) .Marland Kitchen.. 1 Ampule Every 4 Hours As Needed  Allergies (verified): No Known Drug Allergies  Past History:  Past Medical History: Paroxysmal ventricular  tachycardia, Dr Graciela Husbands  Asthma; MSG, cat , dust are triggers Allergic rhinitis Hyperglycemia, borderline Hyperlipidemia, borderline: NMR Lipopoprofile 2010  : LDL 165(2790/2167), TG 255, HDL 29 Colonic polyps, hx of, 2010  Past Surgical History: G2 P 2 Hysterectomy 2004 for abnormal PAP &  painful  menses, Dr Arlyce Dice Wisdom Teeth Extraction; ESI , Dr Ethelene Hal , 02/13/2009;  Colon polypectomy 11/2008, Dr Randa Evens  Family History: Father: Stage 4 Colon  cancer , DM;PG Uncle:Colon cancer; PGFather: Lung  cancer, ?metastatic colon cancer ;PG Mother Deceased:Blockage in Colon;Mother: CAD, High Colesterol, HTN;MG Aunts: Breast  cancer ; Siblings: 1 Brother:depression;Bi-Polar  Social History: Occupation: Oceanographer Married Current Smoker:< 1 ppd Alcohol use-yes: rarely  Regular exercise-no  Review of Systems       The patient complains of anorexia.  The patient denies fever, weight loss, weight gain, vision loss, decreased hearing, hoarseness, abdominal pain, melena, hematochezia, severe indigestion/heartburn, hematuria, suspicious skin lesions, unusual weight change, abnormal bleeding, enlarged lymph nodes, and angioedema.   CV:  Palpitations recently with father's health issues; no exertional chest pain. Resp:  Denies cough, coughing up blood, shortness of breath, sputum productive, and wheezing.  Physical Exam  General:  well-nourished,in no acute distress; alert,appropriate and cooperative throughout examination Head:  Normocephalic and atraumatic without obvious abnormalities. Eyes:  No corneal or conjunctival inflammation noted. Perrla. Funduscopic exam benign, without hemorrhages, exudates or papilledema. Xanthelasma OS upper lid Ears:  External ear exam shows no significant lesions or deformities.  Otoscopic examination reveals clear canals, tympanic membranes are intact bilaterally without bulging, retraction, inflammation or discharge. Hearing is grossly normal bilaterally. Nose:  External nasal examination shows no deformity or inflammation. Nasal mucosa are pink and moist without lesions or exudates. Mouth:  Oral mucosa and oropharynx without lesions or exudates.  Teeth in good repair. Neck:  No deformities, masses, or tenderness noted. Lungs:  Normal respiratory effort, chest expands symmetrically. Lungs are clear to auscultation, no crackles or wheezes. Heart:  Normal rate and regular rhythm. S1  normal ; S2 accentuated ; no  gallop, murmur, click, rub or other extra  sounds. Abdomen:  Bowel sounds positive,abdomen soft and non-tender without masses, organomegaly or hernias noted. Genitalia:  Dr Arlyce Dice Msk:  No deformity or scoliosis noted of thoracic or lumbar spine.   Pulses:  R and L carotid,radial,dorsalis pedis and posterior tibial pulses are full and equal bilaterally Extremities:  No clubbing, cyanosis, edema, or deformity noted with normal full range of motion of all joints.   Neurologic:  alert & oriented X3 and DTRs symmetrical and normal.   Skin:  Intact without suspicious lesions or rashes(see OS lid) Cervical Nodes:  No lymphadenopathy noted Axillary Nodes:  No palpable lymphadenopathy Psych:  memory intact for recent and remote, normally interactive, and good eye contact.     Impression & Recommendations:  Problem # 1:  ROUTINE GENERAL MEDICAL EXAM@HEALTH  CARE FACL (ICD-V70.0)  Orders: EKG w/ Interpretation (93000)  Problem # 2:  VENTRICULAR TACHYCARDIA (ICD-427.1)  Her updated medication list for this problem includes:    Atenolol 50 Mg Tabs (Atenolol) .Marland Kitchen... 1 by mouth two times a day  Orders: EKG w/ Interpretation (93000)  Problem # 3:  HYPERLIPIDEMIA (ICD-272.4) Risks reviewed  Her updated medication list for this problem includes:    Pravastatin Sodium 20 Mg Tabs (Pravastatin sodium) .Marland Kitchen... 1 at bedtime  Problem # 4:  ASTHMA (ICD-493.90) Quiescent  Her updated medication list for this problem includes:    Proair Hfa 108 (90 Base) Mcg/act Aers (Albuterol sulfate) .Marland Kitchen... As needed    Duoneb 0.5-2.5 (3) Mg/62ml Soln (Ipratropium-albuterol) .Marland Kitchen... 1 ampule every 4 hours as needed  Complete Medication List: 1)  Atenolol 50 Mg Tabs (Atenolol) .Marland Kitchen.. 1 by mouth two times a day 2)  Citalopram Hydrobromide 20 Mg Tabs (Citalopram hydrobromide) .Marland Kitchen.. 1 by mouth am 3)  Proair Hfa 108 (90 Base) Mcg/act Aers (Albuterol sulfate) .... As needed 4)  Clonazepam 0.5 Mg Tbdp (Clonazepam) .... 1/2-1 by mouth every 8-12 hours as needed 5)   Pravastatin Sodium 20 Mg Tabs (Pravastatin sodium) .Marland Kitchen.. 1 at bedtime 6)  Hydrocodone-acetaminophen 10-325 Mg Tabs (Hydrocodone-acetaminophen) .Marland Kitchen.. 1 by mouth every 4-6 hours as needed 7)  Duoneb 0.5-2.5 (3) Mg/25ml Soln (Ipratropium-albuterol) .Marland Kitchen.. 1 ampule every 4 hours as needed  Other Orders: Tdap => 14yrs IM (16109) Admin 1st Vaccine (60454)  Patient Instructions: 1)  Please consider risks we discussed. chantix could be Rxed if you are committed to stopping smoking. 2)  Stop Smoking Tips: Choose a Quit date. Cut down before the Quit date. decide what you will do as a substitute when you feel the urge to smoke(gum,toothpick,exercise). 3)  Check your Blood Pressure regularly. Your goal = AVERAGE of < 135/85   Orders Added: 1)  Est. Patient 40-64 years [99396] 2)  EKG w/ Interpretation [93000] 3)  Tdap => 53yrs IM [90715] 4)  Admin 1st Vaccine [09811]   Immunizations Administered:  Tetanus Vaccine:    Vaccine Type: Tdap    Site: right deltoid    Mfr: GlaxoSmithKline    Dose: 0.5 ml    Route: IM    Given by: Shonna Chock CMA    Exp. Date: 11/09/2011    Lot #: [id number]    VIS given: 12/08/07 version given February 04, 2010.   Immunizations Administered:  Tetanus Vaccine:    Vaccine Type: Tdap  Site: right deltoid    Mfr: GlaxoSmithKline    Dose: 0.5 ml    Route: IM    Given by: Shonna Chock CMA    Exp. Date: 11/09/2011    Lot #: [id number]    VIS given: 12/08/07 version given February 04, 2010.

## 2010-02-21 NOTE — Letter (Signed)
Summary: Out of Work  [facility] [employer]  [address] [place], [place] [address]   Phone: [phone]  Fax: [phone]    February 19, 2009   Employee:  Amanda [name]    To Whom It May Concern:   For Medical reasons, please excuse the above named employee from work for the following dates:  Start:   02/19/2009 (Monday)  End:   02/21/2009 [name] date  If you need additional information, please feel free to contact our office.         Sincerely,    [name]

## 2010-02-21 NOTE — Assessment & Plan Note (Signed)
Summary: DIARRHEA,CRAMPS x4DAYS/RH......Marland Kitchen   Vital Signs:  Patient profile:   45 year old female Weight:      174 pounds Temp:     99.1 degrees F oral BP sitting:   164 / 90  (left arm)  Vitals Entered By: Doristine Devoid (Jun 11, 2009 2:35 PM) CC: diarrhea and cramping x4 days, stool very green and loose w/ mucous    History of Present Illness: 45 yo woman here today w/ diarrhea and cramping.  sxs started w/ a HA late last week.  Started abd cramping and loose stools on Friday.  started BRAT diet but even bread started abd cramping and diarrhea.  stools very mucousy, now green.  no recent change in diet, no camping or foreign travel, recent abx.  + sick contacts.  + body aches, 'i feel rotten inside', 'like something crawled up in me and died'.  3 stools today, 7-8 stools yesterday- very watery.  no blood in stool.  subjective fevers at home.  Allergies (verified): No Known Drug Allergies  Review of Systems      See HPI  Physical Exam  General:  Well-developed,well-nourished,in no acute distress; alert,appropriate and cooperative throughout examination Lungs:  Normal respiratory effort, chest expands symmetrically.  CTAB Heart:  Normal rate and regular rhythm. S1 and S2 normal without gallop, murmur, click, rub. Abdomen:  soft, NT/ND, +BS (normoactive) Pulses:  +2 radial, DP Extremities:  brisk cap refill   Impression & Recommendations:  Problem # 1:  DIARRHEA (ICD-787.91) Assessment New check labs to assess for bacterial infxn or dehydration/electrolyte abnormalities.  no clinical signs of dehydation.  no red flags on hx that would lead me to suspect exotic cause of diarrhea.  most likely viral but will send stool studies as multiple people sick w/ similar.  hesitate to suggest immodium if pt has rota or other infectious cause as this will prolong the course.  pt able to control bowels, would prefer not to take meds.  reviewed supportive care and red flags that should prompt  return.  Pt expresses understanding and is in agreement w/ this plan. Orders: Venipuncture ([address]) TLB-CBC Platelet - w/Differential (85025-CBCD) TLB-BMP (Basic Metabolic Panel-BMET) (80048-METABOL) T-Culture, Stool (87045/[id number])  Complete Medication List: 1)  Atenolol 50 Mg Tabs (Atenolol) .... 1/2 in am, 1/2 in pm 2)  Citalopram Hydrobromide 20 Mg Tabs (Citalopram hydrobromide) .Marland Kitchen.. 1 by mouth am 3)  Proair Hfa 108 (90 Base) Mcg/act Aers (Albuterol sulfate) .... As needed 4)  Clonazepam 0.5 Mg Tbdp (Clonazepam) .... 1/2 by mouth as needed anxiety 5)  Pravastatin Sodium 20 Mg Tabs (Pravastatin sodium) .Marland Kitchen.. 1 at bedtime 6)  Hydrocodone-acetaminophen 7.5-325 Mg Tabs (Hydrocodone-acetaminophen) .Marland Kitchen.. 1 by mouth every 6 hours as needed 7)  Duoneb 0.5-2.5 (3) Mg/48ml Soln (Ipratropium-albuterol) .Marland Kitchen.. 1 ampule every 4 hours as needed  Patient Instructions: 1)  We will notify you of your lab results 2)  Make sure you are drinking plenty of fluids- dilute juice or gatorade with water 3)  When you are able start a bland diet- add dairy last 4)  Tylenol/Ibuprofen as needed for pain/fever 5)  Call with any questions or concerns 6)  Hang in there!

## 2010-02-27 NOTE — Progress Notes (Signed)
Summary: REFILL  Phone Note Refill Request Call back at [phone] Message from:  Pharmacy on February 19, 2010 1:47 PM  Refills Requested: Medication #1:  CLONAZEPAM 0.5 MG TBDP 1/2-1 by mouth every 8-12 hours as needed   Dosage confirmed as above?Dosage Confirmed   Supply Requested: 1 month [facility] [address]  Next Appointment Scheduled: NONE Initial call taken by: Lavell Islam,  February 19, 2010 1:47 PM  Follow-up for Phone Call        [facility] please advise on dispense number, not filled here before Follow-up by: Shonna Chock CMA,  February 19, 2010 2:21 PM  Additional Follow-up for Phone Call Additional follow up Details #1::        #30 , as needed as Rxed Additional Follow-up by: Marga Melnick MD,  February 19, 2010 6:21 PM    Prescriptions: CLONAZEPAM 0.5 MG TBDP (CLONAZEPAM) 1/2-1 by mouth every 8-12 hours as needed  #30 x 0   Entered by:   Shonna Chock CMA   Authorized by:   Marga Melnick MD   Signed by:   Shonna Chock CMA on 02/20/2010   Method used:   Printed then faxed to ...       [facility] [phone]* (retail)       [address]       [place], [place]  [address]       Ph: [phone]       Fax: [phone]   RxID:   [id number]

## 2010-04-01 ENCOUNTER — Telehealth (INDEPENDENT_AMBULATORY_CARE_PROVIDER_SITE_OTHER): Payer: Self-pay | Admitting: *Deleted

## 2010-04-09 NOTE — Progress Notes (Signed)
Summary: refill  Phone Note Refill Request   Refills Requested: Medication #1:  CLONAZEPAM 0.5 MG TBDP 1/2-1 by mouth every 8-12 hours as needed [address] fax [phone]  Initial call taken by: Okey Regal Spring,  April 01, 2010 8:56 AM    Prescriptions: CLONAZEPAM 0.5 MG TBDP (CLONAZEPAM) 1/2-1 by mouth every 8-12 hours as needed  #30 x 1   Entered by:   Shonna Chock CMA   Authorized by:   Marga Melnick MD   Signed by:   Shonna Chock CMA on 04/01/2010   Method used:   Printed then faxed to ...       [facility] [phone]* (retail)       [address]       [place], [place]  [address]       Ph: [phone]       Fax: [phone]   RxID:   [id number]

## 2010-04-25 LAB — URINALYSIS, ROUTINE W REFLEX MICROSCOPIC
Bilirubin Urine: NEGATIVE
Glucose, UA: NEGATIVE mg/dL
Hgb urine dipstick: NEGATIVE
Ketones, ur: NEGATIVE mg/dL
Protein, ur: NEGATIVE mg/dL
Urobilinogen, UA: 0.2 mg/dL (ref 0.0–1.0)

## 2010-04-25 LAB — COMPREHENSIVE METABOLIC PANEL
ALT: 14 U/L (ref 0–35)
AST: 20 U/L (ref 0–37)
Albumin: 4.6 g/dL (ref 3.5–5.2)
Alkaline Phosphatase: 88 U/L (ref 39–117)
CO2: 24 mEq/L (ref 19–32)
Chloride: 105 mEq/L (ref 96–112)
GFR calc Af Amer: >60 mL/min (ref >60)
GFR calc non Af Amer: >60 mL/min (ref >60)
Potassium: 4.6 mEq/L (ref 3.5–5.1)
Total Bilirubin: 0.4 mg/dL (ref 0.3–1.2)

## 2010-04-25 LAB — CBC
MCV: 95.8 fL (ref 78.0–100.0)
Platelets: 316 10*3/uL (ref 150–400)
RBC: 4.79 MIL/uL (ref 3.87–5.11)
WBC: 19.7 10*3/uL — ABNORMAL HIGH (ref 4.0–10.5)

## 2010-04-25 LAB — DIFFERENTIAL
Basophils Absolute: 0.6 10*3/uL — ABNORMAL HIGH (ref 0.0–0.1)
Eosinophils Absolute: 0.2 10*3/uL (ref 0.0–0.7)
Lymphs Abs: 2.4 10*3/uL (ref 0.7–4.0)
Monocytes Absolute: 1 10*3/uL (ref 0.1–1.0)
Neutro Abs: 15.5 10*3/uL — ABNORMAL HIGH (ref 1.7–7.7)

## 2010-04-30 ENCOUNTER — Other Ambulatory Visit: Payer: Self-pay

## 2010-04-30 MED ORDER — PRAVASTATIN SODIUM 20 MG PO TABS: 20.0000 mg | ORAL_TABLET | Freq: Every day | ORAL | Status: DC

## 2010-04-30 NOTE — Telephone Encounter (Signed)
[facility] due 272.4, 05/2010

## 2010-05-05 ENCOUNTER — Other Ambulatory Visit: Payer: Self-pay | Admitting: Internal Medicine

## 2010-06-04 NOTE — Letter (Signed)
October 08, 2006    Amanda [name], M.D.  [address]  [place], [place] [address]   RE:  Amanda [name], Amanda [name]  MRN:  [id number]  /  DOB:  02-12-1965   Dear Amanda [name]:   It was a pleasure to see Amanda [name] at your request.   As you know, she is a 45 year old woman with a history dating back  towards April sort of, but actually much more remotely of palpitations.  These are long standing palpitations that she has described as skips,  flips, and stops. These have been persistent and actually ongoing, and  now comprise most of her symptoms.   In the middle of April, she had an episode where her heart started to  race. These would go on for long periods of time i.e. minutes, with  intermittent resolution and recurrence. They were associated with  shortness of breath, chest discomfort, light headedness, and pre-  syncope. She has had no syncope.   Initial impression was that these were related to anxiety and a variety  of neuroleptics were prescribed including Celexa, Klonopin, and then  Wellbutrin.   Ultimately, a Holter monitor was obtained, the summary of which we have,  but the records of which we do not, which described a wide complex  tachycardia and which prompted a referral to you.   Your evaluation included an echocardiogram which was normal, a Myoview  which was normal, and a Holter monitor obtained in the middle of  September which demonstrated about 320 some odd PVCs, and a single run  of a slow wide complex tachycardia. The patient describes her current  symptoms as being what was noted while she was wearing the Holter  monitor with you and she describes them again as fits and flips.   Her past medical history is notable for asthma which has been aggravated  by the intercurrent use of the beta blocker, Bistolic, which has also  been associated with some improvement in her palpitations.   She also has had significant hypertension, as well as some tachycardia  with heart rates  at 110 to 120s through all of this. She does not have  diabetes. She does have modest hypercholesterolemia. Her other cardiac  risk factors are notable for cigarettes.   She also thinks that she is now perimenopausal status post hysterectomy,  but not oophorectomy.   Her past medical history in addition to the above is notable for  bronchitis and pneumonia. Her review of systems is otherwise broadly  negative. There has been a fair amount of stress.   CURRENT MEDICATIONS:  1. Bistolic 2.5/5.  2. Klonopin 0.5 mg.  3. Celexa 20 mg.  4. Wellbutrin. She had tried to come off of the neuroleptics acutely      and developed some severe diffuse pains.   ALLERGIES:  No known drug allergies.   SOCIAL HISTORY:  She is married. She has two children. She does not use  alcohol or recreational drugs. She works as an Print production planner.   PHYSICAL EXAMINATION:  She is a healthy appearing middle aged Caucasian  female whose weight was 170. Unfortunately, I do not have a record of  her blood pressure in front of me, Amanda [name]. Her pulse was 68.  HEENT: No icterus, no xanthomata. There was a growth on her left eye  socket on the superior portion of the orbit. It has been there as long  as she can remember it. It is unilateral.  NECK:  Flat. Carotids  are brisk and full bilaterally without bruits.  BACK:  Without kyphosis or scoliosis.  LUNGS:  Clear.  HEART:  Sounds were regular without murmurs or gallops.  ABDOMEN:  Soft with active bowel sounds, without midline pulsation or  hepatomegaly.  EXTREMITIES:  Femoral pulses were 2+. Distal pulses were intact. There  was no cyanosis, clubbing, or edema.  NEUROLEPTIC:  Grossly normal.  SKIN:  Warm and dry.   Electrocardiogram dated today demonstrated sinus rhythm at 76 with  intervals of 0.16/0.08/0.37. The axis was 0 degrees. The  electrocardiogram had ST segment flattening in the inferior lateral  leads.   Holter monitoring from your office was notable  for the PVCs as  previously described comprising 449 beats with about 22 per hour.   There was an episode of wide complex tachycardia occurring at 818. What  is interesting about this is that the sinus run is interrupted by a PVC.  There has been a wide complex beat that is inscribed in the terminal  portion of the T-wave which is followed by what I think is a P-wave and  then a tachycardia which is wide. The initial deflection across the  proximal portion of the QRS is varying in size suggesting the  possibility that it isan inscribedP-wave.   IMPRESSION:  1. Wide complex tachycardia, supraventricular tachycardia versus VT.  2. Symptomatic PVCs.  3. Anxiety related to the above.  4. Hypertension, temporarily associated with the onset of the above.  5. Multiple concurrently prescribed neuroleptics.   Amanda [name], Amanda [name] has a wide complex tachycardia and symptomatic PVCs. I  am not quite sure which is the greater problem currently and I think  that an event recorder will be essential in trying to understand this.   I think also that an event recorder may be helpful in determining the  mechanism as the onset and the peak QRS relationship may be  illuminating.   I have tried to reassure her that I think that this process is benign.  This is based on the normalcy of her heart. I have also suggested that  we try a different beta blocker that may not aggravate her asthma so  much and so I have given her a prescription today for Atenolol 50 mg to  try 25 mg initially, and I have given her Verapamil 40 mg to take on an  as needed basis.   I will plan to requisition from her insurance company the right for a  monitor given the need to clarify whether this is VT or not, and I will  see her again in about four weeks to review that.   Thanks very much for allowing [place] to see her.    Sincerely,     Amanda Salvia, MD, [facility]  Electronically Signed   SCK/MedQ  DD: 10/08/2006  DT:  10/09/2006  Job #: [id number]   CC:    Amanda [name], M.D.

## 2010-06-04 NOTE — Assessment & Plan Note (Signed)
[facility]                         ELECTROPHYSIOLOGY OFFICE NOTE   NAME:[name], Amanda D                           MRN:          [id number]  DATE:12/31/2006                            DOB:          02-18-1965    Amanda [name] comes in feeling fairly comfortable with her current state of  affairs.  She continues to have some palpitations.  She is tolerating  the Atenolol 50 mg twice daily.  She is also on Celexa and Klonopin.   On examination today, her blood pressure was 120/74, her pulse was 77,  her heartbeat was regular.   Electrocardiogram demonstrated a rate of 77 with otherwise normal  electrocardiogram.   IMPRESSION:  1. Ventricular tachycardia.  2. Normal heart muscle function.   Amanda [name] is currently taking atenolol for her VT.  She would like to  follow with Dr. Lorenz Coaster primarily and she will see me or Dr. Reyes Ivan as  the situation dictates.     Duke Salvia, MD, [facility]  Electronically Signed    SCK/MedQ  DD: 12/31/2006  DT: 01/01/2007  Job #: [id number]   cc:   Reuben Likes, M.D.  Elmore Guise., M.D.

## 2010-06-04 NOTE — Letter (Signed)
October 08, 2006     RE:  KEWANNA, KASPRZAK  MRN:  [id number]  /  DOB:  07-13-1965   To Whom It May Concern:   Mrs. Tello was seen in consultation emergently today for wide complex  tachycardia that was felt to be ventricular in origin. This has been  significantly symptomatic.   She also has symptomatic PVCs. Prior to proceeding with invasive  evaluation, with its intended risks, clarification of the primary  symptomatic arrhythmia is essential. To that end, I request the use of a  CardioNet/event recorder to allow [place] to clarify this.   If I can be of any further assistance in clarifying this request, please  do not hesitate to contact me.    Sincerely,      Duke Salvia, MD, [facility]  Electronically Signed    SCK/MedQ  DD: 10/08/2006  DT: 10/08/2006  Job #: [id number]

## 2010-06-07 NOTE — Assessment & Plan Note (Signed)
[facility]                                 ON-CALL NOTE   Amanda [name], Amanda [name]                           MRN:          [id number]  DATE:10/22/2006                            DOB:          12-23-1965    Primary cardiologist, Dr. Graciela Husbands.  Supervising physician Dr. Lalla Brothers.   SUMMARY OF HISTORY:  Amanda [name] is a patient of Dr. Odessa Fleming with a history  of palpitations dating back since April that may have been wide complex  tachycardia.  Dr. Graciela Husbands arranged a Holter monitor and [employer] called  this evening stating that Amanda [name] was having significant wide complex  ventricular tachycardia.  I had initially left her a message on her  voice mail asking her to call us back.  She called back.   She states that Dr. Graciela Husbands started her on atenolol 50 mg p.o. daily as  well as verapamil 120 p.r.n.  She has had these episodes for a long  time.  She states that when she has them, she describes a fluttering,  and she tries to hold very still and relax, and they usually go away in  a minute or so.  She denies any syncope associated with them.  Since  beginning the atenolol, she states that the episodes are further apart,  but she continues to have them.  This afternoon, she did take the  verapamil per Dr. Odessa Fleming instructions because they continue fluttering  sensations.  Her symptomatology has not changed.  Dr. Lalla Brothers actually  spoke with her and advised her to take 1/2 of her 50 mg of atenolol this  evening and to try to relax.  If she developed any symptoms or further  palpitations that she could not tolerate, she was advised to call EMS  and be transported to [facility] for further evaluation.  We will  leave information at the office and also would Trish from the morning  for the EP team to evaluate these findings and to contact her to review.  Amanda [name] was agreeable with this plan.     Joellyn Rued, PA-C  Electronically Signed    EW/MedQ  DD: 10/22/2006  DT:  10/23/2006  Job #: [id number]

## 2010-06-07 NOTE — Assessment & Plan Note (Signed)
[facility] HEALTHCARE                                 ON-CALL NOTE   KANDI, BRUSSEAU                           MRN:          [id number]  DATE:10/24/2006                            DOB:          1965-07-29    PROGRESS NOTE:  I received a phone call from [facility] from [facility]  stating that Ms. Mounger had a 35 second episode of wide complex  tachycardia with a maximum heart rate of 107 beats per minute. I did  followup and call Ms. Sallas at home and spoke with her husband. He stated  that she was asleep and he would wake her up. Upon awakening, the  patient states that she had had no complaints of palpitations.  Obviously, she had been sleeping and had no discomfort or feeling  associated with it. She states that Dr. Graciela Husbands has been monitoring her  with the [facility].   Dr. Graciela Husbands has the patient on Atenolol 50 mg daily, as well as Verapamil  120 mg as needed for palpitations. I asked the patient if she has been  needing to take it. She says she only needed to take it once and she  really did not feel a difference one way or the other. The patient is  actually asymptomatic and states that she was feeling fine and up until  the time that I called her to wake her up, she was not having any  difficulties or problems within the last 24 hours.   I have advised the patient to continue to monitor any symptoms  associated with any palpitations, dizziness, or chest discomfort and to  notify us.      Bettey Mare. Lyman Bishop, NP  Electronically Signed      Duke Salvia, MD, [facility]  Electronically Signed   KML/MedQ  DD: 10/24/2006  DT: 10/24/2006  Job #: [id number]   cc:   Duke Salvia, MD, [facility]  Jonelle Sidle, MD

## 2010-06-07 NOTE — H&P (Signed)
Amanda [name], Amanda [name]                              ACCOUNT NO.:  [id number]   MEDICAL RECORD NO.:  [id number]                   PATIENT TYPE:  INP   LOCATION:  NA                                   [facility]   PHYSICIAN:  Ilda Mori, M.D.                DATE OF BIRTH:  1965/02/06   DATE OF ADMISSION:  05/06/2002  DATE OF DISCHARGE:                                HISTORY & PHYSICAL   CHIEF COMPLAINT:  Dysmenorrhea and cervical dysplasia.   HISTORY OF PRESENT ILLNESS:  This is a 45 year old gravida 2, para 2, who  was found to have a high-grade dysplasia on a Pap smear.  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  The patient also complains of several years of increasingly severe painful  periods which occasionally interfere with the ability to work and carry on  her normal activities.   ALLERGIES:  No known drug allergies.   PAST SURGICAL HISTORY:  No previous surgery.   PAST MEDICAL HISTORY:  History of sinus problems and asthma which are  controlled with Serevent which is the only medication she is currently  taking.   SOCIAL HISTORY:  She smokes 1/2 pack of cigarettes a day.  Occasionally  drinks alcohol.  She works as a Production designer, theatre/television/film.   FAMILY HISTORY:  Positive for hypertension and heart disease in her mother.   REVIEW OF SYMPTOMS:  Negative in detail.   PHYSICAL EXAMINATION:  GENERAL:  She is 5 feet 4 inches tall, 170 pounds.  HEENT:  Normal.  NECK:  Supple without thyromegaly.  There was no lymphadenopathy.  HEART:  Normal sinus rhythm without murmurs or gallops.  LUNGS:  Clear to auscultation and percussion.  ABDOMEN:  Soft without hepatosplenomegaly.  PELVIC:  Normal external genitalia.  The cervix was clean and normal.  Uterus was normal size, shape, and contour.  The adnexa were without masses.   IMPRESSION:  This is a 45 year old woman who has completed her childbearing.  She suffers from severe  dysmenorrhea which has not responded to non-  steroidal anti-inflammatory drugs.  She is a smoker, and is not a candidate  for hormonal therapy.  Although various forms of progesterone therapy were  discussed with the patient.  In addition, she is found to have high-grade  dysplasia which would require a cone biopsy to further evaluation and treat.  The options were discussed with the patient who elected to proceed with  transvaginal hysterectomy.                                               Ilda Mori, M.D.   RK/MEDQ  D:  05/04/2002  T:  05/04/2002  Job:  [id number]

## 2010-06-07 NOTE — Assessment & Plan Note (Signed)
[facility]ALTH[facility]ARE                                 ON-CALL NOTE   Amanda [name], Amanda [name]                           MRN:          [id number]  DATE:10/22/2006                            DOB:          [birth year]    PRIMARY CARDIOLOGIST:  Duke Salvia, MD, [facility].   SUPERVISING CARDIOLOGISTS:  1. Veverly Fells. Excell Seltzer, M.D.  2. Christell Faith, M.D.   BRIEF HISTORY:  [name] is a 45 year old female that has been evaluated  by Dr. Graciela Husbands in the office in regards to palpitations that may be a wide  complex tachycardia associated with shortness of breath, chest  discomfort, lightheadedness, and near syncope.  It is noted she has not  had any actual loss of consciousness.  [facility] has sent the records to  [place] via fax and in fact, she does have a wide complex tachycardia with  ventricular rate in the 125 region.  I reviewed strips and history with  Dr. Lalla Brothers and Dr. Excell Seltzer and they feel that the patient should come to  the emergency room.  I have tried contacting her but she is not  answering her phone.  I therefore left a message for her to call us back  so that we may review the strips and encourage her to come to the  emergency room via EMS.     Joellyn Rued, PA-C  Electronically Signed    EW/MedQ  DD: 10/22/2006  DT: 10/23/2006  Job #: [id number]

## 2010-06-07 NOTE — Discharge Summary (Signed)
Amanda [name], EWALD                              ACCOUNT NO.:  [id number]   MEDICAL RECORD NO.:  [id number]                   PATIENT TYPE:  INP   LOCATION:  9140                                 [facility]ILITY:  [facility]   PHYSICIAN:  Ilda Mori, M.D.                DATE OF BIRTH:  Jul 28, 1965   DATE OF ADMISSION:  05/06/2002  DATE OF DISCHARGE:  05/08/2002                                 DISCHARGE SUMMARY   FINAL DIAGNOSES:  1. Uterine endometriosis.  2. Leiomyoma uteri.  3. Dysmenorrhea.  4. Cervical dysplasia.   SECONDARY DIAGNOSIS:  None.   PROCEDURE:  Transvaginal hysterectomy.   COMPLICATIONS:  None.   CONDITION ON DISCHARGE:  Improved.   HISTORY:  This is a 45 year old gravida 2, para 2, who was found to have  high-grade dysplasia on cervical cytology.  Colposcopy revealed high-grade  moderate dysplasia of the cervix with possible dysplasia in the endocervical  canal.  In addition to her problems with cervical dysplasia, the patient  complained of a long history of severe dysmenorrhea, which has been getting  worse over the past several months.  The patient sometimes misses work as a  result of this problem.  Because of the possible residual cervical dysplasia  and the history of dysmenorrhea, decision was made to proceed with  transvaginal hysterectomy.   OPERATION:  The patient was brought to the operating room the day of  admission, where a transvaginal hysterectomy was performed without  complication.   POSTOPERATIVE:  The patient's postoperative course was benign without  significant fever or anemia.   On the morning of the second postoperative day, the patient was voiding  well, and her pain was controlled with oral analgesia, and she was passing  gas and eating without difficulty.  She was, therefore, felt ready to be  discharged.   DIET:  She was discharged on a regular diet.   ACTIVITY:  She was told to limit her activities.   DISCHARGE MEDICATIONS:  She was  given Tylox 20 tablets to take 1 to 2 every  4 hours for pain.   FOLLOW UP:  She was asked to call the office for followup evaluation in 2  weeks.   LABORATORY DATA:  Her pathology report revealed an 86-gram uterus with  severe squamous dysplasia of the cervix, with free margins, benign  proliferative endometrium, focal adenomyosis, and a 1-cm uterine leiomyoma.  Her blood work revealed an admission hemoglobin of 15.2 with a white count  of 7,400.  Postoperative hemoglobin was 11.6 with a white count of 13,000.  Her routine chemistry values were all within normal limits.  Her urinalysis  was benign.  Ilda Mori, M.D.    RK/MEDQ  D:  06/05/2002  T:  06/06/2002  Job:  [id number]

## 2010-06-07 NOTE — Op Note (Signed)
NAMEBELLAROSE, BURTT                              ACCOUNT NO.:  [id number]   MEDICAL RECORD NO.:  [id number]                   PATIENT TYPE:  INP   LOCATION:  9198                                 [facility]ILITY[facility]  [facility]   PHYSICIAN:  Ilda Mori, M.D.                DATE OF BIRTH:  04-06-1965   DATE OF PROCEDURE:  05/05/2002  DATE OF DISCHARGE:                                 OPERATIVE REPORT   PREOPERATIVE DIAGNOSES:  1. Severe dysmenorrhea.  2. High grade cervical dysplasia.   POSTOPERATIVE DIAGNOSES:  1. Severe dysmenorrhea.  2. High grade cervical dysplasia.   PROCEDURE:  Transvaginal hysterectomy.   SURGEON:  Ilda Mori, M.D.   ASSISTANT:  Luvenia Redden, M.D.   ANESTHESIA:  General endotracheal.   ESTIMATED BLOOD LOSS:  100 mL.   FINDINGS:  A normal sized uterus.  Normal appearing tubes and ovaries.   INDICATIONS:  This is a 45 year old gravida 2, para 2 who was found to have  high grade dysplasia on a cervical cytology.  Colposcopy revealed confirmed  high grade moderate dysplasia of the cervix with possible dysplasia in the  endocervical curettage specimen.  The patient noted that she had been having  severe dysmenorrhea for a year with at times missing work as a result.  She  is a smoker and was not a candidate for oral contraceptives.  The [facility]  progesterone IUD was discussed with the patient as well as other forms of  conservative management of dysmenorrhea but the patient elected to proceed  with vaginal hysterectomy for the treatment of her dysmenorrhea and for the  removal of the dysplastic cells in her cervix.   PROCEDURE:  The patient was taken to the operating room and placed in the  supine position.  General endotracheal anesthesia was induced.  The lower  abdomen, perineum, and vagina were prepped and draped in a sterile fashion.  A weighted speculum was placed in the vagina.  The cervix was grasped with a  Christella Hartigan tenaculum.  15 mL of 0.5% Marcaine with  1:20,000 epinephrine was  injected in the paracervical tissues.  The cervix was circumcised and the  vaginal mucosa was dissected free.  The anterior cul-de-sac was entered  without difficulty.  The posterior cul-de-sac was then entered.  The  uterosacral ligaments were clamped, cut, and ligated bilaterally.  The  cardinal ligaments and the uterine arteries were then clamped with the  LigaSure clamp and cauterized and cut.  The base of the broad ligament was  clamped with the LigaSure, cauterized, and cut.  The uterus was delivered  posteriorly and the remainder of the uterine ovarian anastomosis was  clamped, cauterized, and cut with the LigaSure clamp.  The specimen was  freed.  Hemostasis noted to be present.  The cul-de-sac was closed with a  chromic suture from uterosacral to uterosacral posteriorly.  The vaginal  cuff posteriorly was whipped with a running interlocking 0  Vicryl suture.  The peritoneum was closed with a purse-string suture and the  anterior portion of the vaginal cuff was closed with figure-of-eight  sutures.  The procedure was then terminated and the patient left the  operating room in good condition.                                               Ilda Mori, M.D.    RK/MEDQ  D:  05/06/2002  T:  05/06/2002  Job:  [id number]

## 2010-06-24 ENCOUNTER — Encounter: Payer: Self-pay | Admitting: Internal Medicine

## 2010-06-24 ENCOUNTER — Ambulatory Visit (INDEPENDENT_AMBULATORY_CARE_PROVIDER_SITE_OTHER): Payer: Commercial Managed Care - PPO | Admitting: Internal Medicine

## 2010-06-24 DIAGNOSIS — I472 Ventricular tachycardia: Secondary | ICD-10-CM

## 2010-06-24 DIAGNOSIS — E785 Hyperlipidemia, unspecified: Secondary | ICD-10-CM

## 2010-06-24 DIAGNOSIS — F17200 Nicotine dependence, unspecified, uncomplicated: Secondary | ICD-10-CM | POA: Insufficient documentation

## 2010-06-24 DIAGNOSIS — E079 Disorder of thyroid, unspecified: Secondary | ICD-10-CM

## 2010-06-24 LAB — TSH: TSH: 0.65 u[IU]/mL (ref 0.35–5.50)

## 2010-06-24 MED ORDER — ATENOLOL 50 MG PO TABS: 50.0000 mg | ORAL_TABLET | Freq: Two times a day (BID) | ORAL | Status: DC

## 2010-06-24 MED ORDER — CITALOPRAM HYDROBROMIDE 40 MG PO TABS: 40.0000 mg | ORAL_TABLET | Freq: Every day | ORAL | Status: DC

## 2010-06-24 NOTE — Assessment & Plan Note (Signed)
LDL 118; TG 185 in 01/12. Smoking 10-15 cig/ day. No FH MI; mother has angina.

## 2010-06-24 NOTE — Progress Notes (Signed)
  Subjective:    Patient ID: Amanda [name], female    DOB: [birth year], 45 y.o.   MRN: [id number]  HPI Depression,Situational Onset:04/03/2010 as separation after 24 years; similar issue 2000. They separated for 6 months but reconciled w/o counselling. Anxiety:yes Loss of interest (Anhedonia):yes Panic attacks:some Insomnia:Benadryl @ bedtime with some benefit Anorexia:"non existent" Fatigue: no Neurologic signs/symptoms: Headache, numbness and tingling, weakness:no Endocrinologic signs and symptoms: Hoarseness,  vision change, temperature intolerance,  skin/hair,/nail changes:no Family history of mental health issues, alcoholism or drug abuse:brother bipolar Medications/efficacy:Clonazepam as needed helps; Celexa 20 mg for 3 years.She saw LisaFlores,LCSW,PA.  Note: husband unemployed X 2 years; he managed finances. Mortgage not paid since 01/2010. Accounts emptied & he relocated to [place].All communications via "nasty" texts with profanity.Son, who 24 lives @ home, marries in 6 days. Daughter 20 lives @ home.  Her husband's sister is paranoid & has conversations with  imaginary people.OCD prominent also.    Review of Systems Weight loss of 5#; stools loose to watery. No PMH of GI disease.She had polyps 2010. Her father had colon cancer.     Objective:   Physical Exam Gen.: Healthy and well-nourished in appearance. Alert, appropriate and cooperative throughout exam.Intermittently tearful. Head: Normocephalic without obvious abnormalities.  Eyes: No corneal or conjunctival inflammation noted. Pupils equal round reactive to light and accommodation.  FOV Extraocular motion intact.Field of  Vision grossly normal. Xanthelasma OS  Neck: No deformities, masses, or tenderness noted. Range of motion normal . Thyroid : physiologic asymmetry , R> L. R lobe tender Lungs: Normal respiratory effort; chest expands symmetrically. Lungs are clear to auscultation without rales, wheezes, or increased work of  breathing. Heart: Slow rate and regular  rhythm. Normal S1 and S2. No gallop, click, or rub. No  murmur. Abdomen: Bowel sounds normal; abdomen soft and nontender. No masses, organomegaly or hernias noted. No clubbing, cyanosis, edema, or deformity noted.Tone & strength  normal.Joints normal. Nail health  good. Vascular: Carotid, radial artery  are full and equal. No bruits present. Neurologic: Alert and oriented x3. Deep tendon reflexes symmetrical and normal. No tremor. Gait & Romberg normal.   Skin: Intact without suspicious lesions or rashes. Lymph: No cervical, axillary, or inguinal lymphadenopathy present. Psych: Mood and affect are flat. Normally interactive . Good eye contact.   No suicidal ideation.                                                                                     Assessment & Plan:  #1 Situational Depression #2 thyroid tenderness (no nodules) #3 Insomnia Plan: #1 Increase Citalopram to 40 mg daily #2 Stop Benadryl #3Zolpidem 5 mg qhs prn

## 2010-06-24 NOTE — Patient Instructions (Signed)
Use generic Ambien every 3 rd night as needed only

## 2010-06-25 ENCOUNTER — Other Ambulatory Visit: Payer: Self-pay | Admitting: Internal Medicine

## 2010-06-25 MED ORDER — CLONAZEPAM 0.5 MG PO TABS: ORAL_TABLET | ORAL | Status: DC

## 2010-06-25 NOTE — Telephone Encounter (Signed)
RX sent to pharmacy  

## 2010-06-26 ENCOUNTER — Other Ambulatory Visit: Payer: Self-pay | Admitting: Internal Medicine

## 2010-06-26 NOTE — Telephone Encounter (Signed)
Dr.Hopper Please advise if ok to give another RX for [employer]

## 2010-06-26 NOTE — Telephone Encounter (Signed)
OK # 10 , 1 every 3 rd night prn

## 2010-06-26 NOTE — Telephone Encounter (Signed)
Pt is not sure if there is a [facility] in [place]---she will research other pharmacies when she gets to her Mom's house in [place]----so if prescription can be refilled, please call her to find out best location to call prescription in for her

## 2010-06-27 MED ORDER — ZOLPIDEM TARTRATE 10 MG PO TABS
10.0000 mg | ORAL_TABLET | ORAL | Status: DC | PRN
Start: 2010-06-27 — End: 2010-10-25

## 2010-06-27 NOTE — Telephone Encounter (Signed)
Patient aware rx sent to pharmacy, per patient request send [facility]

## 2010-08-25 ENCOUNTER — Other Ambulatory Visit: Payer: Self-pay | Admitting: Internal Medicine

## 2010-08-28 ENCOUNTER — Encounter: Payer: Self-pay | Admitting: Internal Medicine

## 2010-09-09 ENCOUNTER — Ambulatory Visit (HOSPITAL_BASED_OUTPATIENT_CLINIC_OR_DEPARTMENT_OTHER)
Admission: RE | Admit: 2010-09-09 | Discharge: 2010-09-09 | Disposition: A | Discharge status: Home / Self Care | Payer: Commercial Managed Care - PPO | Source: Ambulatory Visit | Attending: Family Medicine | Admitting: Family Medicine

## 2010-09-09 ENCOUNTER — Ambulatory Visit (INDEPENDENT_AMBULATORY_CARE_PROVIDER_SITE_OTHER): Payer: Commercial Managed Care - PPO | Admitting: Family Medicine

## 2010-09-09 ENCOUNTER — Other Ambulatory Visit: Payer: Self-pay | Admitting: Internal Medicine

## 2010-09-09 ENCOUNTER — Encounter: Payer: Self-pay | Admitting: Family Medicine

## 2010-09-09 ENCOUNTER — Telehealth: Payer: Self-pay | Admitting: Internal Medicine

## 2010-09-09 VITALS — BP 160/90 | Temp 98.6°F | Wt 164.0 lb

## 2010-09-09 DIAGNOSIS — W230XXA Caught, crushed, jammed, or pinched between moving objects, initial encounter: Secondary | ICD-10-CM

## 2010-09-09 DIAGNOSIS — M25529 Pain in unspecified elbow: Secondary | ICD-10-CM | POA: Insufficient documentation

## 2010-09-09 MED ORDER — IBUPROFEN 600 MG PO TABS: 600.0000 mg | ORAL_TABLET | Freq: Four times a day (QID) | ORAL | Status: AC | PRN

## 2010-09-09 NOTE — Telephone Encounter (Signed)
Discuss with patient   Notes Recorded by Neena Rhymes, MD on 09/09/2010 at 4:49 PM Normal xray- no break seen. This is good news!

## 2010-09-09 NOTE — Patient Instructions (Signed)
[facility] to [facility] and [facility]- we'll call with your results Take the Ibuprofen regularly for the 1st week and then as needed Ice regularly for relief of pain and swelling Hang in there!!

## 2010-09-09 NOTE — Progress Notes (Signed)
  Subjective:    Patient ID: Amanda [name], female    DOB: [birth year], 45 y.o.   MRN: [id number]  HPI L arm pain- pt is in process of moving, was standing on a chair while trying to get to cabinet on top of fridge.  Arm got pinned between cabinet and fridge.  Injury occurred last night.  Iced arm, took hydrocodone and klonopin last night w/out relief.  Reports she has full ROM.   Review of Systems For ROS see HPI     Objective:   Physical Exam  Vitals reviewed. Constitutional: She appears well-developed and well-nourished.       Obviously uncomfortable  Musculoskeletal: Normal range of motion. She exhibits edema (swelling over distal humerus, just above L elbow, very painful to palpation, bruising evident) and tenderness.       L shoulder and elbow w/ full ROM          Assessment & Plan:

## 2010-09-09 NOTE — Assessment & Plan Note (Signed)
Pt w/ evidence of deep bruising.  Given fat pad swelling and tenderness must r/o elbow/humeral fx.  Will get xrays to eval.  If fx present, will refer to [facility] for tx (pt has used them previously)  Ice and NSAIDs for pain and swelling

## 2010-09-10 ENCOUNTER — Telehealth: Payer: Self-pay

## 2010-09-10 MED ORDER — PRAVASTATIN SODIUM 20 MG PO TABS: 20.0000 mg | ORAL_TABLET | Freq: Every day | ORAL | Status: DC

## 2010-09-10 NOTE — Telephone Encounter (Signed)
Pt.notified

## 2010-09-10 NOTE — Telephone Encounter (Signed)
Message copied by Beverely Low on Tue Sep 10, 2010 11:58 AM ------      Message from: Sheliah Hatch      Created: Mon Sep 09, 2010  4:49 PM       Normal xray- no break seen.  This is good news!

## 2010-09-10 NOTE — Telephone Encounter (Signed)
Rx sent to pharmacy   

## 2010-10-18 ENCOUNTER — Ambulatory Visit (INDEPENDENT_AMBULATORY_CARE_PROVIDER_SITE_OTHER): Payer: Commercial Managed Care - PPO | Admitting: Family Medicine

## 2010-10-18 ENCOUNTER — Encounter: Payer: Self-pay | Admitting: Family Medicine

## 2010-10-18 VITALS — BP 130/80 | HR 69 | Temp 98.8°F | Wt 162.0 lb

## 2010-10-18 DIAGNOSIS — L739 Follicular disorder, unspecified: Secondary | ICD-10-CM

## 2010-10-18 DIAGNOSIS — L738 Other specified follicular disorders: Secondary | ICD-10-CM

## 2010-10-18 DIAGNOSIS — K644 Residual hemorrhoidal skin tags: Secondary | ICD-10-CM

## 2010-10-18 MED ORDER — HYDROCORTISONE 2.5 % RE CREA: TOPICAL_CREAM | Freq: Two times a day (BID) | RECTAL | Status: AC

## 2010-10-18 MED ORDER — DOXYCYCLINE HYCLATE 100 MG PO TABS: 100.0000 mg | ORAL_TABLET | Freq: Two times a day (BID) | ORAL | Status: AC

## 2010-10-18 NOTE — Progress Notes (Signed)
  Subjective:    Patient ID: Amanda [name], female    DOB: 1965-09-12, 45 y.o.   MRN: [id number]  HPI Pt here complaint of hemorrhoids and boils .  She has a hx of boils and used the cream Dr Alwyn Ren gave her but it did not improve.  She has use multiple otc hemorrhoid creams with no relief.  No bleeding   Review of Systems As above    Objective:   Physical Exam  Constitutional: She appears well-developed and well-nourished.  Skin:     Psychiatric: She has a normal mood and affect. Her behavior is normal. Judgment and thought content normal.          Assessment & Plan:  1 hemorrhoids--- refer to surgery, donut cushion,  proctosol hc                                Sitz baths 2.  Folliculitis--- doxy rto prn

## 2010-10-18 NOTE — Patient Instructions (Signed)
Hemorrhoids Hemorrhoids are dilated (enlarged) veins around the rectum. Sometimes clots will form in the veins. This makes them swollen and painful. These are called thrombosed hemorrhoids. Causes of hemorrhoids include:  Pregnancy: this increases the pressure in the hemorrhoidal veins.   Constipation.   Straining to have a bowel movement.  HOME CARE INSTRUCTIONS  Eat a well balanced diet and drink 6 to 8 glasses of water every day to avoid constipation. You may also use a bulk laxative.   Avoid straining to have bowel movements.   Keep anal area dry and clean.   Only take over-the-counter or prescription medicines for pain, discomfort, or fever as directed by your caregiver.  If thrombosed:  Take hot sitz baths for 20 to 30 minutes, 3 to 4 times per day.   If the hemorrhoids are very tender and swollen, place ice packs on area as tolerated. Using ice packs between sitz baths may be helpful. Fill a plastic bag with ice and use a towel between the bag of ice and your skin.   Special creams and suppositories (Anusol, Nupercainal, Wyanoids) may be used or applied as directed.   Do not use a donut shaped pillow or sit on the toilet for long periods. This increases blood pooling and pain.   Move your bowels when your body has the urge; this will require less straining and will decrease pain and pressure.   Only take over-the-counter or prescription medicines for pain, discomfort, or fever as directed by your caregiver.  SEEK MEDICAL CARE IF:  You have increasing pain and swelling that is not controlled with your prescription.   You have uncontrolled bleeding.   You have an inability or difficulty having a bowel movement.   You have pain or inflammation outside the area of the hemorrhoids.   You have chills and/or an oral temperature above 100.4 that lasts for 2 days or longer, or as your caregiver suggests.  MAKE SURE YOU:   Understand these instructions.   Will watch your  condition.   Will get help right away if you are not doing well or get worse.  Document Released: 01/04/2000 Document Re-Released: 12/20/2007 [facility] Patient Information 2011 [place], [place].

## 2010-10-21 ENCOUNTER — Encounter (INDEPENDENT_AMBULATORY_CARE_PROVIDER_SITE_OTHER): Payer: Self-pay | Admitting: General Surgery

## 2010-10-21 ENCOUNTER — Ambulatory Visit (INDEPENDENT_AMBULATORY_CARE_PROVIDER_SITE_OTHER): Payer: Commercial Managed Care - PPO | Admitting: General Surgery

## 2010-10-21 VITALS — BP 142/98 | HR 64 | Temp 97.7°F | Resp 20 | Ht 63.0 in | Wt 162.4 lb

## 2010-10-21 DIAGNOSIS — K645 Perianal venous thrombosis: Secondary | ICD-10-CM

## 2010-10-21 MED ORDER — LIDOCAINE 5 % EX OINT: TOPICAL_OINTMENT | Freq: Every day | CUTANEOUS | Status: DC

## 2010-10-21 NOTE — Patient Instructions (Signed)
Used Xylocaine ointment as needed. Cover with gauze pad and expect some bloody drainage. Warm sitz baths after bowel movements and as needed. Call if not feeling steadily better.

## 2010-10-21 NOTE — Progress Notes (Signed)
Subjective:   Painful hemorrhoid  Patient ID: Amanda [name], female   DOB: 07-28-1965, 45 y.o.   MRN: [id number]  HPI Patient is a 45 year old female followed by Dr. Laury Axon. She has a history of occasional swollen hemorrhoids in the past that have resolved with medical treatment. She now presents with one and a half weeks of sudden onset of a painful marble-sized tender lump at the anus. She has not been constipated. She had a little bleeding just today. She's not had any previous surgical procedures for her hemorrhoids. Past Medical History  Diagnosis Date  . Tachycardia, paroxysmal     Dr Graciela Husbands  . Asthma   . Allergy     rhinitis  . Hyperglycemia     borderline  . Hyperlipidemia     borderline  . Hx of colonic polyps 2010  . Hemorrhoids    Past Surgical History  Procedure Date  . Abdominal hysterectomy 2004    For Abnormal Pap & painful menses, Dr Arlyce Dice  . Wisdom tooth extraction 02/13/2009    Dr.Ramos  . Colonoscopy w/ polypectomy 11/2008    Dr Randa Evens   Current Outpatient Prescriptions  Medication Sig Dispense Refill  . atenolol (TENORMIN) 50 MG tablet Take 1 tablet (50 mg total) by mouth 2 (two) times daily.  180 tablet  1  . clonazePAM (KLONOPIN) 0.5 MG tablet 1/2-1 by mouth every 8-12 hours as needed  30 tablet  0  . doxycycline (VIBRA-TABS) 100 MG tablet Take 1 tablet (100 mg total) by mouth 2 (two) times daily.  28 tablet  0  . HYDROcodone-acetaminophen (NORCO) 10-325 MG per tablet Take 1 tablet by mouth. 1 by mouth every 4-6 hours as needed       . hydrocortisone (ANUSOL-HC) 2.5 % rectal cream Place rectally 2 (two) times daily.  30 g  0  . ipratropium-albuterol (DUONEB) 0.5-2.5 (3) MG/3ML SOLN Take 3 mLs by nebulization every 4 (four) hours as needed.        . pravastatin (PRAVACHOL) 20 MG tablet Take 1 tablet (20 mg total) by mouth daily.  90 tablet  1  . PROAIR HFA 108 (90 BASE) MCG/ACT inhaler INHALE 2 PUFFS EVERY 4 HOURS AS NEEDED...NEEDS OFFICE VISIT...  8.5 g  5  .  zolpidem (AMBIEN) 10 MG tablet Take 1 tablet (10 mg total) by mouth every three (3) days as needed.  10 tablet  1   No Known Allergies History  Substance Use Topics  . Smoking status: Current Everyday Smoker -- 1.0 packs/day    Types: Cigarettes  . Smokeless tobacco: Not on file  . Alcohol Use: Yes     Rare      Review of Systems     Objective:   Physical Exam General: Well-developed Caucasian female in no distress Pertinent physical exam was limited anorectum. In the right lateral position is a moderate sized very tender tense thrombosed external hemorrhoid.    Assessment:     Thrombosed external hemorrhoid. We discussed options for surgical and nonsurgical treatment. This is not getting better over a week and a half L. elected to go ahead and excise this.  Procedure note: The area was blocked with Marcaine. The hemorrhoid was elliptically excised and clot evacuated. A small adjacent clot was also lanced and evacuated.    Plan:     Wound care was explained. She is given a prescription for Xylocaine ointment. Will return as needed for persistent or worsening symptoms.

## 2010-10-23 ENCOUNTER — Telehealth (INDEPENDENT_AMBULATORY_CARE_PROVIDER_SITE_OTHER): Payer: Self-pay

## 2010-10-23 NOTE — Telephone Encounter (Signed)
Pt called requesting a return to work note to go back on Monday.  She works at a [employer] and sits all day.  She is still on [employer].  Her work needs a note saying that she is release with no restrictions including no pain meds.  Please let her know when to return to work.

## 2010-10-24 ENCOUNTER — Other Ambulatory Visit: Payer: Self-pay | Admitting: Internal Medicine

## 2010-10-25 ENCOUNTER — Encounter (INDEPENDENT_AMBULATORY_CARE_PROVIDER_SITE_OTHER): Payer: Self-pay

## 2010-10-25 ENCOUNTER — Other Ambulatory Visit: Payer: Self-pay | Admitting: Internal Medicine

## 2010-10-25 MED ORDER — ZOLPIDEM TARTRATE 10 MG PO TABS: 10.0000 mg | ORAL_TABLET | ORAL | Status: DC | PRN

## 2010-10-25 NOTE — Telephone Encounter (Signed)
RX sent

## 2010-11-13 IMAGING — CT CT ABDOMEN W/ CM
2 of 5 series · 17 of 46 positions shown, 19 images · IV contrast (APPLIED)
Comparison: None available.

CT ABDOMEN

CLINICAL DATA: Nausea and vomiting.

CT ABDOMEN AND PELVIS WITH CONTRAST
TECHNIQUE: Multidetector CT imaging of the abdomen and pelvis was
performed using the standard protocol following bolus
administration of intravenous contrast.
Contrast: 100 ml 5mnipaque-COO.

[Series 2: abd/pelvis 5.0 b31f · axial · 0.72mm/px · z∈[-439,-34]mm · 14 of 91 slices shown, 16 images]
[im 5/91  soft-tissue]
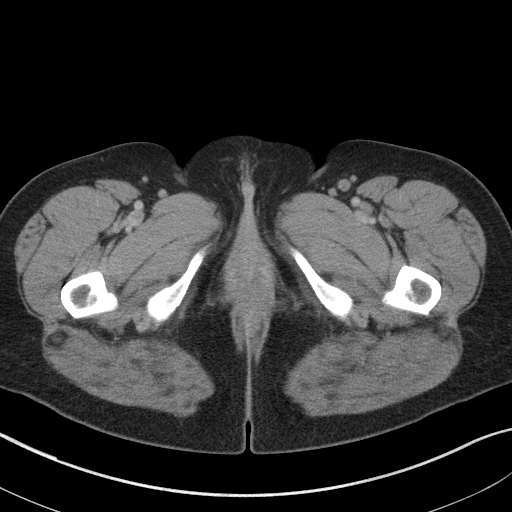
[im 5/91  bone]
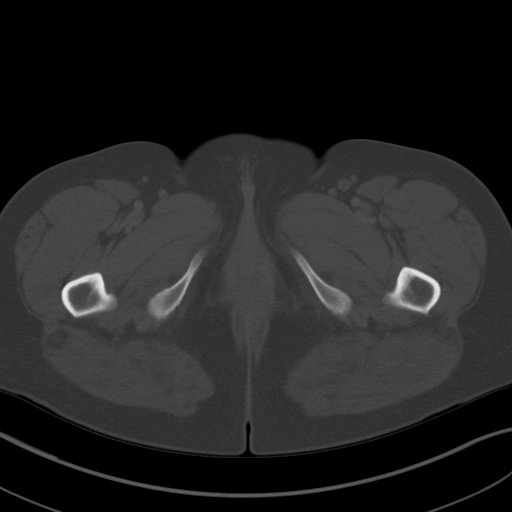
[im 10/91  soft-tissue]
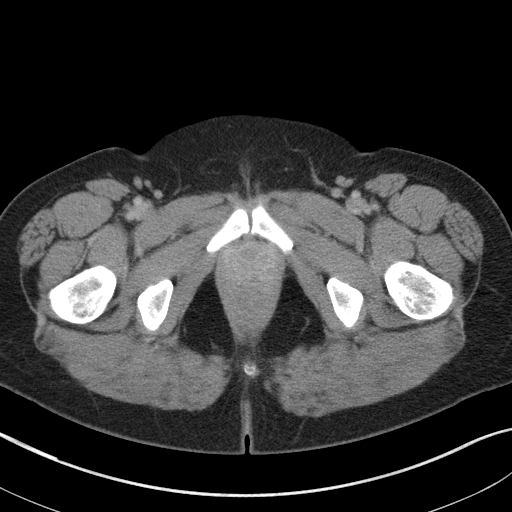
[im 19/91  soft-tissue]
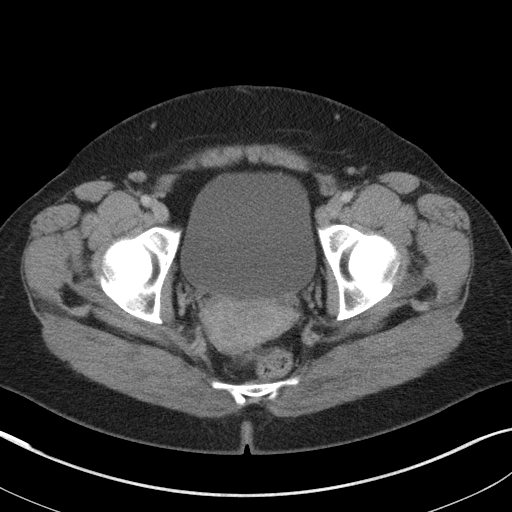
[im 24/91  soft-tissue]
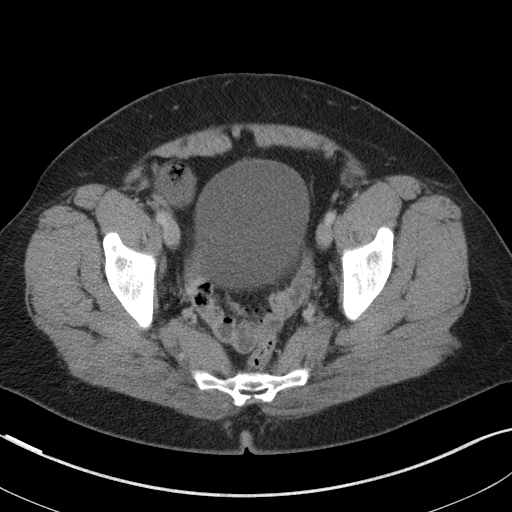
[im 29/91  soft-tissue]
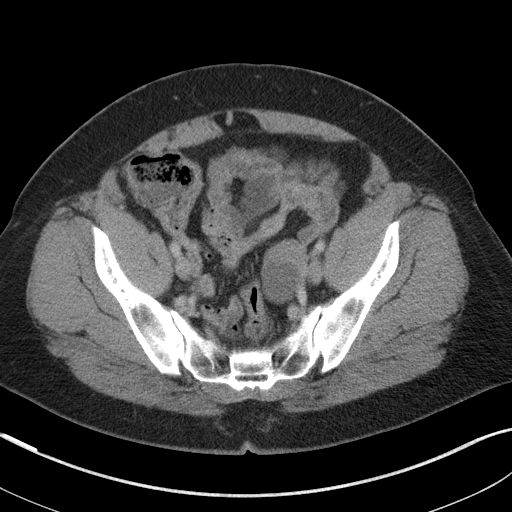
[im 38/91  soft-tissue]
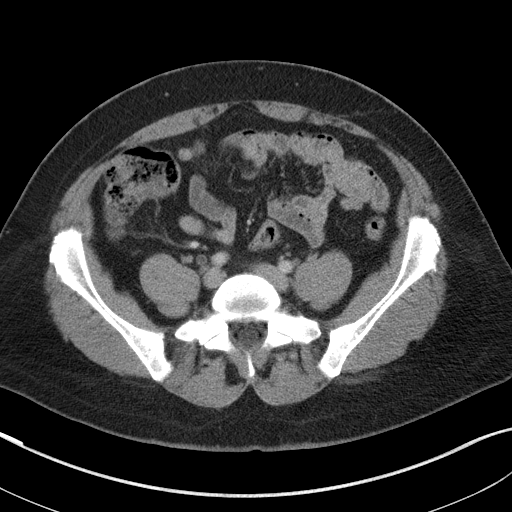
[im 43/91  soft-tissue]
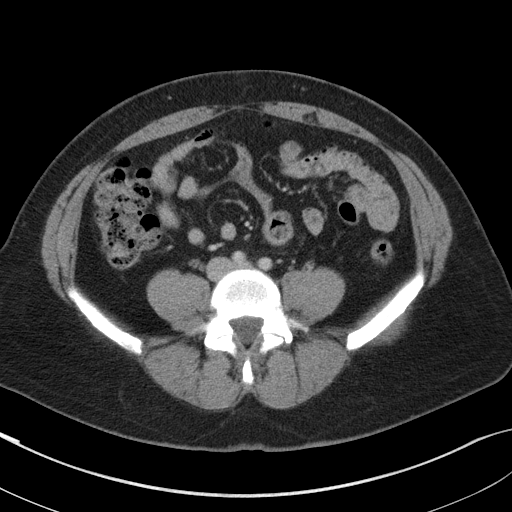
[im 48/91  soft-tissue]
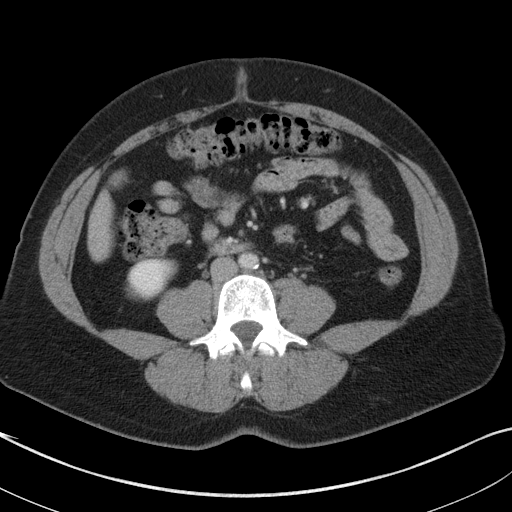
[im 53/91  soft-tissue]
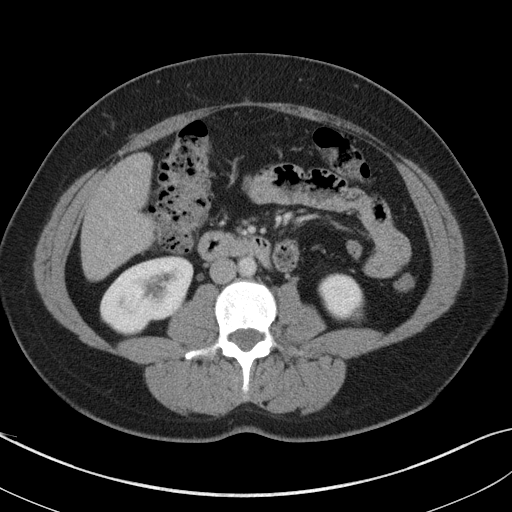
[im 53/91  bone]
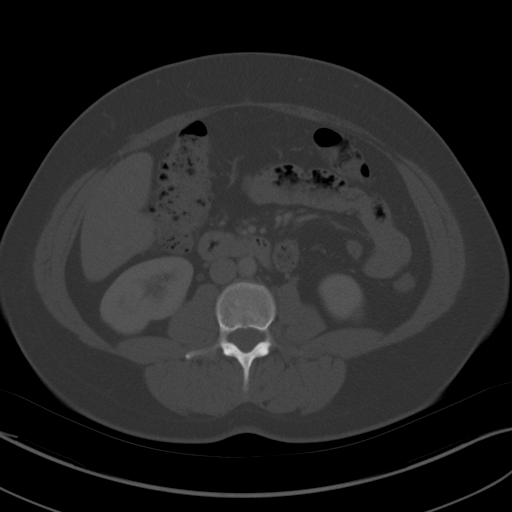
[im 62/91  soft-tissue]
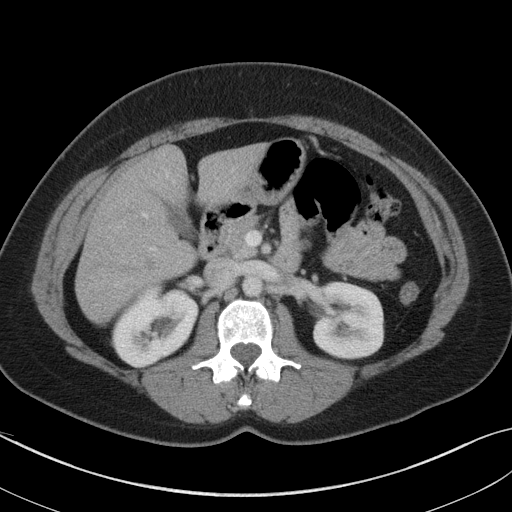
[im 67/91  soft-tissue]
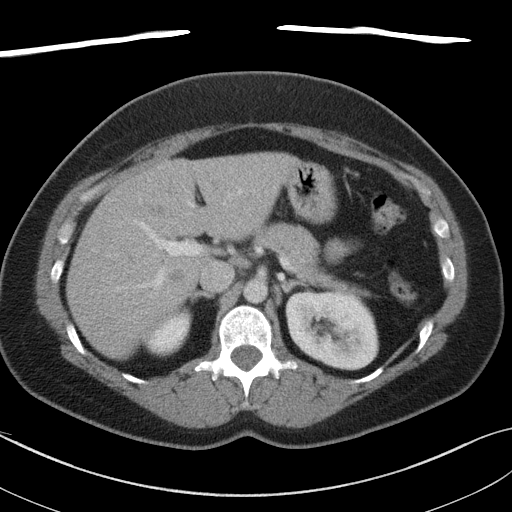
[im 72/91  soft-tissue]
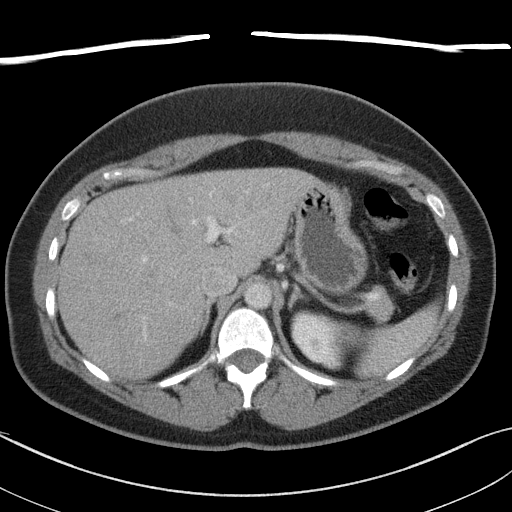
[im 81/91  soft-tissue]
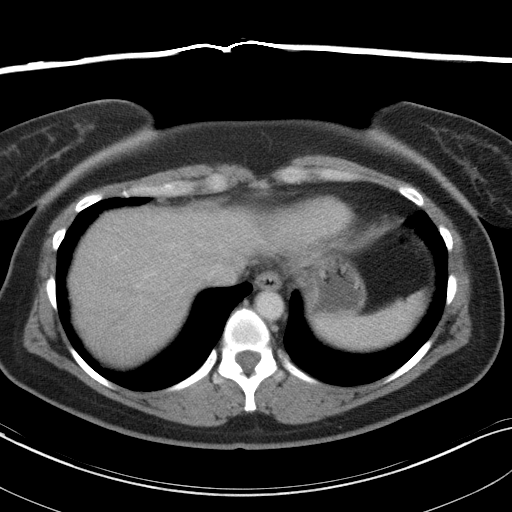
[im 86/91  soft-tissue]
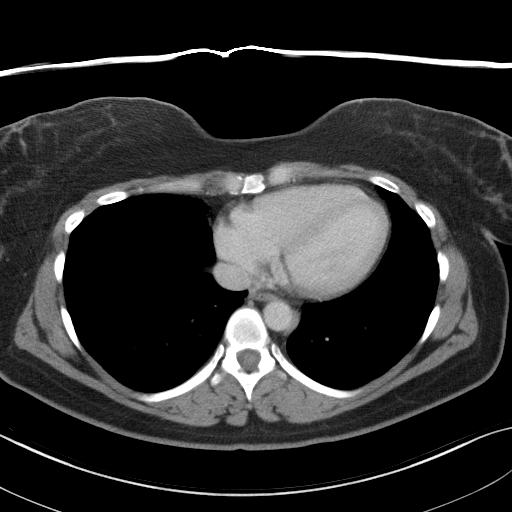

[Series 5: abd/pelvis 3.0 coronal · coronal · 0.77mm/px · 3 of 83 slices shown]
[im 28/83  soft-tissue]
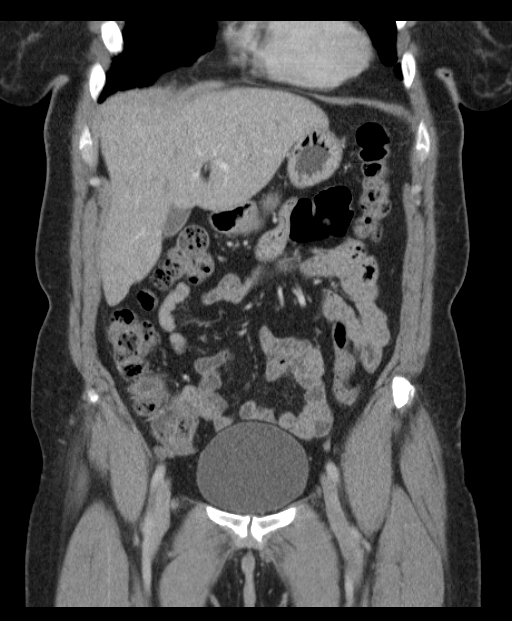
[im 37/83  soft-tissue]
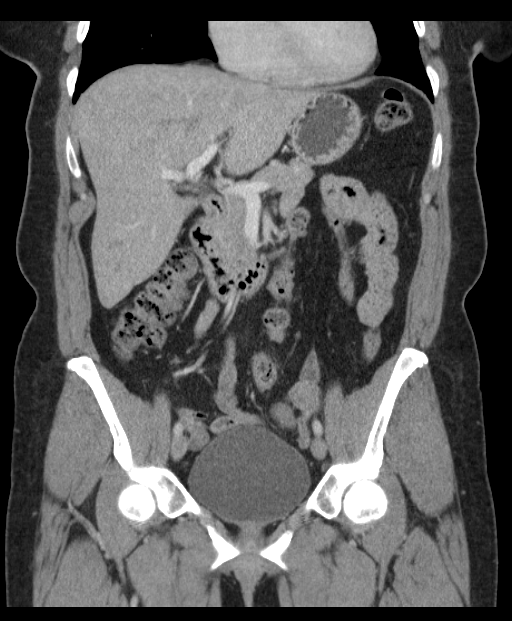
[im 46/83  soft-tissue]
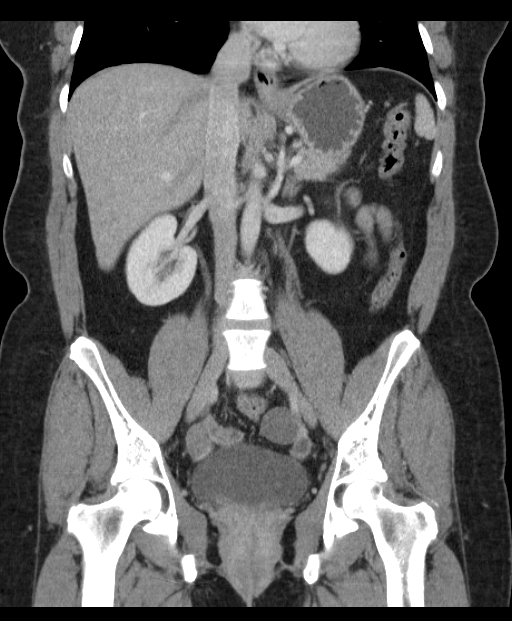

[17 of 46 positions shown; findings below may reference images not displayed]

FINDINGS: The lung bases are clear.  No pleural or pericardial
effusion.

The liver, gallbladder, biliary tree, adrenal glands, spleen,
pancreas and kidneys appear normal.  The stomach and small bowel
appear normal.  There is no abdominal lymphadenopathy or fluid.
There is some atherosclerotic vascular disease in a nonaneurysmal
aorta.  No focal bony abnormality.
IMPRESSION: No acute finding in the abdomen.

CT PELVIS
FINDINGS: There is no pelvic fluid or lymphadenopathy.  Uterus and
right adnexa are unremarkable.  There is a left ovarian cyst
measuring 3.1 x 3.0 by 2.9 cm.  The colon appears normal.  The
appendix is normal in appearance.  No focal bony abnormality.
IMPRESSION: 1.  No acute finding in the pelvis.
[DATE] cm left ovarian cyst.  Recommend follow-up ultrasound in 4-
6 weeks to ensure resolution.

## 2010-12-09 ENCOUNTER — Other Ambulatory Visit: Payer: Self-pay | Admitting: Internal Medicine

## 2010-12-09 DIAGNOSIS — I472 Ventricular tachycardia: Secondary | ICD-10-CM

## 2010-12-10 MED ORDER — ATENOLOL 50 MG PO TABS: 50.0000 mg | ORAL_TABLET | Freq: Two times a day (BID) | ORAL | Status: DC

## 2010-12-10 MED ORDER — CITALOPRAM HYDROBROMIDE 40 MG PO TABS: 40.0000 mg | ORAL_TABLET | Freq: Every day | ORAL | Status: DC

## 2010-12-10 NOTE — Telephone Encounter (Signed)
RXs sent.

## 2010-12-30 ENCOUNTER — Ambulatory Visit (INDEPENDENT_AMBULATORY_CARE_PROVIDER_SITE_OTHER): Payer: Commercial Managed Care - PPO | Admitting: Internal Medicine

## 2010-12-30 ENCOUNTER — Encounter: Payer: Self-pay | Admitting: Internal Medicine

## 2010-12-30 VITALS — BP 136/84 | HR 70 | Temp 98.8°F | Wt 168.0 lb

## 2010-12-30 DIAGNOSIS — F17200 Nicotine dependence, unspecified, uncomplicated: Secondary | ICD-10-CM

## 2010-12-30 DIAGNOSIS — J45901 Unspecified asthma with (acute) exacerbation: Secondary | ICD-10-CM

## 2010-12-30 MED ORDER — MOMETASONE FURO-FORMOTEROL FUM 200-5 MCG/ACT IN AERO: 2.0000 | INHALATION_SPRAY | Freq: Two times a day (BID) | RESPIRATORY_TRACT | Status: DC

## 2010-12-30 NOTE — Patient Instructions (Signed)
Consider  [facility]'s smoking cessation program @ www.[email or web address] or 503-249-0897.   Please think about quitting smoking. Review the risks we discussed. Please call 1-800-QUIT-NOW (2291746590) for free smoking cessation counseling.

## 2010-12-30 NOTE — Progress Notes (Signed)
  Subjective:    Patient ID: Amanda [name], female    DOB: [birth year], 45 y.o.   MRN: [id number]  HPI ASTHMA :Onset of exacerbation:2 + months Triggers (environmental, infectious, allergic):mold found in her apartment due to a leak in apt above her Severity/impact:increased rescue MDI use , before bed & upon arising with benefit Maintenance medications/ response:no maintenance Nocturnal dyspnea:usually 3 am Associated symptoms: Allergic:No itchy eyes, sneezing, postnasal drainage,  Angioedema but throat "raspy" Constitution:No fever, chills, sweats YNW:GNFAO  frontal headache w/o  facial pain, nasal purulence,  dental pain Cardiovascular: No palpitations except occasional fluttering evaluated by Dr Graciela Husbands.No edema, calf swelling/ pain Respiratory: No cough, sputum, pleuritic chest pain. Significant dyspnea &  wheezing GI: No heartburn/dyspepsia, dysphagia Derm: No rash, urticaria/hives Heme/lymph: ? enlarged submandibular nodes  Past medical history: Seasonal allergies :yes ; age of onset of asthma:late 90s, not as child Smoking history:still smoking , < 1 ppd Family history pulmonary disease: no    Review of Systems     Objective:   Physical Exam General appearance is of good health and nourishment; no acute distress or increased work of breathing is present.  No  lymphadenopathy about the head, neck, or axilla noted.   Eyes: No conjunctival inflammation or lid edema is present.  Xanthelasma OS upper lid  Ears:  External ear exam shows no significant lesions or deformities.  Otoscopic examination reveals clear canals, tympanic membranes are intact bilaterally without bulging, retraction, inflammation or discharge.  Nose:  External nasal examination shows no deformity or inflammation. Nasal mucosa are pink and moist without lesions or exudates. Minor  septal dislocation to left.No obstruction to airflow.   Oral exam: Dental hygiene is good; lips and gums are healthy appearing.There  is no oropharyngeal erythema or exudate noted.     Heart:  Normal rate and regular rhythm. S1 and S2 normal without gallop,  click, rub or other extra sounds.  Grade 1/2 over 6 systolic murmur  @ base  Lungs:Chest clear to auscultation; no wheezes, rhonchi,rales ,or rubs present.No increased work of breathing.    Extremities:  No cyanosis, edema, or clubbing  noted    Skin: Warm & dry          Assessment & Plan:  #1 asthma; flare in the context of mold exposure her apartment and ongoing smoking. No active bronchospasm clinically at this time.  She has had increased use of the rescue inhaler. The pathophysiology of asthma was discussed with her. Smoking cessation was discussed.  Plan: She'll be placed on Dulera 1 puffs every 12 hours. She will be moving from her apartment. References concerning smoking cessation were provided.

## 2011-01-24 ENCOUNTER — Telehealth: Payer: Self-pay | Admitting: Internal Medicine

## 2011-01-24 ENCOUNTER — Encounter: Payer: Self-pay | Admitting: Internal Medicine

## 2011-01-24 ENCOUNTER — Ambulatory Visit (INDEPENDENT_AMBULATORY_CARE_PROVIDER_SITE_OTHER): Payer: Commercial Managed Care - PPO | Admitting: Internal Medicine

## 2011-01-24 VITALS — BP 130/88 | HR 71 | Temp 98.0°F | Wt 171.0 lb

## 2011-01-24 DIAGNOSIS — N390 Urinary tract infection, site not specified: Secondary | ICD-10-CM

## 2011-01-24 LAB — POCT URINALYSIS DIPSTICK: Glucose, UA: NEGATIVE

## 2011-01-24 MED ORDER — NITROFURANTOIN MONOHYD MACRO 100 MG PO CAPS: 100.0000 mg | ORAL_CAPSULE | Freq: Two times a day (BID) | ORAL | Status: DC

## 2011-01-24 MED ORDER — NITROFURANTOIN MONOHYD MACRO 100 MG PO CAPS: 100.0000 mg | ORAL_CAPSULE | Freq: Two times a day (BID) | ORAL | Status: AC

## 2011-01-24 NOTE — Telephone Encounter (Signed)
Feels like she has UTI, please contact patient

## 2011-01-24 NOTE — Patient Instructions (Signed)
Stay well hydrated. Drink to thirst up to 40 ounces of fluids daily.NSAIDS ( Aleve, Advil, Naproxen) or Tylenol every 4 hrs as needed for fever as discussed based on label recommendations

## 2011-01-24 NOTE — Progress Notes (Signed)
  Subjective:    Patient ID: Amanda [name], female    DOB: [birth year], 46 y.o.   MRN: [id number]  HPI DYSURIA: Onset: 1/3 am    Worsening: better with Azo Symptoms Urgency: yes  Frequency: yes,   Hesitancy: no  Hematuria: no  Flank Pain: no  Fever: no    Nausea/Vomiting: yes, ? Due to Azo  Discharge: no Red Flags  : (Risk Factors for Complicated UTI) Recent Antibiotic Usage (last 30 days): no  Symptoms lasting more than seven (7) days: no  More than 3 UTI's last 12 months: no  PMH of  1. DM: no 2. Renal Disease/Calculi: no 3. Urinary Tract Abnormality: no  4. Instrumentation/Trauma: no 5. Immunosuppression: no      Review of Systems  She did describe some low back pain recently been related to dysuria; she is in PT     Objective:   Physical Exam General appearance is one of good health and nourishment w/o distress.  Eyes: No conjunctival inflammation or scleral icterus is present.     Heart:  Normal rate and regular rhythm. S1 and S2 normal without gallop,  click, rub or other extra sounds  . Soft grade 1 systolic murmur   Lungs:Chest clear to auscultation; no wheezes, rhonchi,rales ,or rubs present.No increased work of breathing.   Abdomen: bowel sounds normal, soft and non-tender without masses, organomegaly or hernias noted.  No guarding or rebound .  Musculoskeletal: She is able to lie flat and sit up without help. Straight leg raising is negative. Strength and tone are normal.  Neuro: Normal deep tendon reflexes  Skin:Warm & dry.  Intact without suspicious lesions or rashes ; no jaundice or tenting  Lymphatic: No lymphadenopathy is noted about the head, neck, axilla areas.              Assessment & Plan:  #1 urinary tract infection. The Azo makes evaluation urinalysis difficult.  Nitrofuratoin will be prescribed pending return of the culture results.

## 2011-01-24 NOTE — Progress Notes (Signed)
Addended by: Arnette Norris on: 01/24/2011 04:34 PM   Modules accepted: Orders

## 2011-01-27 LAB — URINE CULTURE: Colony Count: >=100000

## 2011-01-29 NOTE — Telephone Encounter (Signed)
Patient had OV 01/24/11

## 2011-02-28 ENCOUNTER — Telehealth: Payer: Self-pay | Admitting: Internal Medicine

## 2011-02-28 MED ORDER — CLONAZEPAM 0.5 MG PO TABS: ORAL_TABLET | ORAL | Status: DC

## 2011-02-28 NOTE — Telephone Encounter (Signed)
RX manually faxed  

## 2011-02-28 NOTE — Telephone Encounter (Signed)
Refill: Clonazepam .5mg  tablet. Take 1/2-1 tablet every 8-12 hrs as needed. Qty. 30. Last fill 06-25-10  Pharmacy comments: Please call

## 2011-06-30 ENCOUNTER — Telehealth: Payer: Self-pay | Admitting: Internal Medicine

## 2011-06-30 MED ORDER — CLONAZEPAM 0.5 MG PO TABS: ORAL_TABLET | ORAL | Status: DC

## 2011-06-30 NOTE — Telephone Encounter (Signed)
Refill: Clonazepam 0.5mg  tablet. Take 1/2 to 1 tablet by mouth every 8-12 hours as needed.

## 2011-06-30 NOTE — Telephone Encounter (Signed)
Rx sent 

## 2011-06-30 NOTE — Telephone Encounter (Signed)
OK X1 

## 2011-06-30 NOTE — Telephone Encounter (Signed)
Last OV 01-23-10, last filled 02-28-11 #30

## 2011-08-30 ENCOUNTER — Other Ambulatory Visit: Payer: Self-pay | Admitting: Internal Medicine

## 2011-09-06 ENCOUNTER — Other Ambulatory Visit: Payer: Self-pay | Admitting: Internal Medicine

## 2011-09-08 NOTE — Telephone Encounter (Signed)
Refill: Zolpidem tartrate 10mg  tablet. Take 1 tablet every 3 days as needed. Last fill 04-05-11

## 2011-09-09 ENCOUNTER — Other Ambulatory Visit: Payer: Self-pay | Admitting: *Deleted

## 2011-09-09 ENCOUNTER — Other Ambulatory Visit: Payer: Self-pay | Admitting: Internal Medicine

## 2011-09-09 DIAGNOSIS — E785 Hyperlipidemia, unspecified: Secondary | ICD-10-CM

## 2011-09-09 DIAGNOSIS — J45901 Unspecified asthma with (acute) exacerbation: Secondary | ICD-10-CM

## 2011-09-09 DIAGNOSIS — T887XXA Unspecified adverse effect of drug or medicament, initial encounter: Secondary | ICD-10-CM

## 2011-09-09 MED ORDER — ZOLPIDEM TARTRATE 10 MG PO TABS: 10.0000 mg | ORAL_TABLET | ORAL | Status: DC | PRN

## 2011-09-09 MED ORDER — MOMETASONE FURO-FORMOTEROL FUM 200-5 MCG/ACT IN AERO: 2.0000 | INHALATION_SPRAY | Freq: Two times a day (BID) | RESPIRATORY_TRACT | Status: DC

## 2011-09-09 MED ORDER — PRAVASTATIN SODIUM 20 MG PO TABS: 20.0000 mg | ORAL_TABLET | Freq: Every day | ORAL | Status: DC

## 2011-09-09 NOTE — Addendum Note (Signed)
Addended by: Maurice Small on: 09/09/2011 02:51 PM   Modules accepted: Orders

## 2011-09-09 NOTE — Telephone Encounter (Signed)
Pt called requesting a refill for Dulera. Refill sent to pharmacy.

## 2011-09-09 NOTE — Telephone Encounter (Signed)
Refill Pravastatin Sodium (Tab) PRAVACHOL 20 MG Take 1 tablet (20 mg total) by mouth daily. #90, last fill 2.7.13 last ov 1.4.13 acute for UTI

## 2011-09-09 NOTE — Telephone Encounter (Signed)
Patient needs to schedule fasting labs, future orders placed

## 2011-11-12 ENCOUNTER — Other Ambulatory Visit: Payer: Self-pay | Admitting: Internal Medicine

## 2011-11-23 ENCOUNTER — Ambulatory Visit: Payer: Commercial Managed Care - PPO

## 2011-11-23 ENCOUNTER — Ambulatory Visit (INDEPENDENT_AMBULATORY_CARE_PROVIDER_SITE_OTHER): Payer: Commercial Managed Care - PPO | Admitting: Family Medicine

## 2011-11-23 VITALS — BP 153/92 | HR 73 | Temp 98.2°F | Resp 16 | Ht 65.0 in | Wt 183.0 lb

## 2011-11-23 DIAGNOSIS — M79641 Pain in right hand: Secondary | ICD-10-CM

## 2011-11-23 DIAGNOSIS — M79609 Pain in unspecified limb: Secondary | ICD-10-CM

## 2011-11-23 DIAGNOSIS — E785 Hyperlipidemia, unspecified: Secondary | ICD-10-CM

## 2011-11-23 MED ORDER — PRAVASTATIN SODIUM 20 MG PO TABS: 20.0000 mg | ORAL_TABLET | Freq: Every day | ORAL | Status: DC

## 2011-11-23 NOTE — Progress Notes (Addendum)
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  The right hand has been swollen and ecchymotic since.  She used ice and ibuprofen  Objective:  NAD  Moderate dorsal right hand ecchymosis and swelling [facility] reading (PRIMARY) by  Dr. Milus Glazier:  Right hand. negative   Assessment:  Hand sprain, hyperlipidemia 1. Hand pain, right  DG Hand Complete Right   pravachol refilled.   Also:   She's off her pravastatin and recent cholesterol: TC-256 Trig-235 HDL-36 LDL-173 She is still smoking.

## 2011-11-24 ENCOUNTER — Telehealth: Payer: Self-pay | Admitting: Internal Medicine

## 2011-11-24 NOTE — Telephone Encounter (Signed)
Per Dr.Hopper report Neg, if symptoms continue recommend Hand Specialist. Patient aware and verbalized understanding

## 2011-11-24 NOTE — Telephone Encounter (Signed)
Pt states she went to [facility] for an accident with her hand. She thinks it is broken and would like Dr. Alwyn Ren to review the xray and let her know his opinion.

## 2011-11-25 ENCOUNTER — Ambulatory Visit (INDEPENDENT_AMBULATORY_CARE_PROVIDER_SITE_OTHER): Payer: Self-pay | Admitting: Family Medicine

## 2011-11-25 ENCOUNTER — Encounter: Payer: Self-pay | Admitting: Family Medicine

## 2011-11-25 VITALS — BP 130/70 | HR 75 | Temp 98.4°F | Wt 185.2 lb

## 2011-11-25 DIAGNOSIS — S60229A Contusion of unspecified hand, initial encounter: Secondary | ICD-10-CM

## 2011-11-25 DIAGNOSIS — J329 Chronic sinusitis, unspecified: Secondary | ICD-10-CM

## 2011-11-25 MED ORDER — CEFUROXIME AXETIL 500 MG PO TABS: 500.0000 mg | ORAL_TABLET | Freq: Two times a day (BID) | ORAL | Status: AC

## 2011-11-25 NOTE — Patient Instructions (Signed)

## 2011-11-25 NOTE — Progress Notes (Signed)
  Subjective:     Amanda [name] is a 46 y.o. female who presents for evaluation of sinus pain. Symptoms include: congestion, facial pain, headaches, nasal congestion, purulent rhinorrhea and sinus pressure. Onset of symptoms was 2 days ago. Symptoms have been gradually worsening since that time. Past history is significant for no history of pneumonia or bronchitis. Patient is a non-smoker. Pt also fell at airport Saturday an UC gave her a wrap---xrays neg.  Pt would like [place] to look at it.   The following portions of the patient's history were reviewed and updated as appropriate: allergies, current medications, past family history, past medical history, past social history, past surgical history and problem list.  Review of Systems Pertinent items are noted in HPI.   Objective:    BP 130/70  Pulse 75  Temp 98.4 F (36.9 C) (Oral)  Wt 185 lb 3.2 oz (84.006 kg)  SpO2 97% General appearance: alert, cooperative, appears stated age and no distress Ears: normal TM's and external ear canals both ears Nose: green discharge, moderate congestion, turbinates red, swollen, sinus tenderness bilateral Throat: abnormal findings: mild oropharyngeal erythema and pnd Neck: moderate anterior cervical adenopathy, supple, symmetrical, trachea midline and thyroid not enlarged, symmetric, no tenderness/mass/nodules R hand-- + ecchymosis,  + from,  Some tenderness but improved per pt Lungs: clear to auscultation bilaterally     Assessment:    Acute bacterial sinusitis R hand contusion--  Con' with splint                                  Consider hand specialist if no improvement.   Plan:    Neti pot recommended. Instructions given. Nasal steroids per medication orders. Antihistamines per medication orders. Ceftin per medication orders. f/u prn

## 2011-12-08 ENCOUNTER — Encounter: Payer: Self-pay | Admitting: Internal Medicine

## 2011-12-09 ENCOUNTER — Telehealth: Payer: Self-pay

## 2011-12-09 DIAGNOSIS — T887XXA Unspecified adverse effect of drug or medicament, initial encounter: Secondary | ICD-10-CM

## 2011-12-09 DIAGNOSIS — E785 Hyperlipidemia, unspecified: Secondary | ICD-10-CM

## 2011-12-09 NOTE — Telephone Encounter (Signed)
Message copied by Maurice Small on Tue Dec 09, 2011  1:13 PM ------      Message from: Marshell Garfinkel      Created: Tue Dec 09, 2011 10:56 AM                   ----- Message -----         From: Pecola Lawless, MD         Sent: 12/08/2011   5:42 PM           To: Marshell Garfinkel             Based on your prior advanced testing, your LDL goal is < 100 , ideally < 70. Your present LDL from  [facility] in [place] increases long term heart attack or stroke risk almost 75 %.Please consider these facts & see me if you wish to address your risk. The best dietary  information on cholesterol is Dr Gildardo Griffes book Eat, Drink & Be Healthy.

## 2011-12-09 NOTE — Telephone Encounter (Signed)
Please  schedule fasting Labs after 10-12 weeks of cholesterol meds :CK,Lipids, hepatic panel.PLEASE BRING THESE INSTRUCTIONS TO FOLLOW UP  LAB APPOINTMENT.This will guarantee correct labs are drawn, eliminating need for repeat blood sampling ( needle sticks ! ). Diagnoses /Codes: 161.0,960.45

## 2011-12-09 NOTE — Telephone Encounter (Signed)
Spoke with patient, patient states she was off cholesterol medication about 1 month or longer prior to having labs checked. Patient restarted pravastatin x 1 week ago. Patient questions if she should stay on med for a while and have labs rechecked then follow-up or schedule a follow-up now.   Hopp please advise

## 2011-12-09 NOTE — Addendum Note (Signed)
Addended by: Maurice Small on: 12/09/2011 05:25 PM   Modules accepted: Orders

## 2011-12-09 NOTE — Telephone Encounter (Signed)
Left message on voicemail informing patient of Dr.Hopper's response. Placed order for future labs. Patient instructed to call and schedule lab appointment and f/u with Dr.Hopper 2-3 days later

## 2012-01-29 ENCOUNTER — Other Ambulatory Visit: Payer: Self-pay | Admitting: Internal Medicine

## 2012-01-29 NOTE — Telephone Encounter (Signed)
Patient aware to stop by the office to pick-up rx and sign Controlled Sub. Contract

## 2012-02-19 ENCOUNTER — Encounter: Payer: Self-pay | Admitting: Internal Medicine

## 2012-03-02 ENCOUNTER — Other Ambulatory Visit: Payer: Self-pay | Admitting: Internal Medicine

## 2012-03-29 ENCOUNTER — Encounter: Payer: Self-pay | Admitting: Internal Medicine

## 2012-03-29 ENCOUNTER — Ambulatory Visit (INDEPENDENT_AMBULATORY_CARE_PROVIDER_SITE_OTHER): Payer: Self-pay | Admitting: Internal Medicine

## 2012-03-29 VITALS — BP 128/80 | HR 75 | Temp 98.1°F | Resp 14 | Ht 64.08 in | Wt 197.0 lb

## 2012-03-29 DIAGNOSIS — Z0000 Encounter for general adult medical examination without abnormal findings: Secondary | ICD-10-CM

## 2012-03-29 DIAGNOSIS — I4510 Unspecified right bundle-branch block: Secondary | ICD-10-CM | POA: Insufficient documentation

## 2012-03-29 DIAGNOSIS — D126 Benign neoplasm of colon, unspecified: Secondary | ICD-10-CM

## 2012-03-29 DIAGNOSIS — R1013 Epigastric pain: Secondary | ICD-10-CM

## 2012-03-29 LAB — CBC WITH DIFFERENTIAL/PLATELET
Basophils Absolute: 0 10*3/uL (ref 0.0–0.1)
Eosinophils Relative: 2.4 % (ref 0.0–5.0)
HCT: 40.9 % (ref 36.0–46.0)
Hemoglobin: 13.9 g/dL (ref 12.0–15.0)
Lymphs Abs: 2.3 10*3/uL (ref 0.7–4.0)
MCV: 93.6 fl (ref 78.0–100.0)
Monocytes Absolute: 0.5 10*3/uL (ref 0.1–1.0)
Neutro Abs: 5.6 10*3/uL (ref 1.4–7.7)
Platelets: 329 10*3/uL (ref 150.0–400.0)
RDW: 13.9 % (ref 11.5–14.6)

## 2012-03-29 MED ORDER — RANITIDINE HCL 150 MG PO TABS: 150.0000 mg | ORAL_TABLET | Freq: Two times a day (BID) | ORAL | Status: DC

## 2012-03-29 MED ORDER — PRAVASTATIN SODIUM 20 MG PO TABS: 20.0000 mg | ORAL_TABLET | Freq: Every day | ORAL | Status: DC

## 2012-03-29 MED ORDER — CLONAZEPAM 0.5 MG PO TABS: ORAL_TABLET | ORAL | Status: DC

## 2012-03-29 NOTE — Progress Notes (Signed)
  Subjective:    Patient ID: Amanda [name], female    DOB: 1965-12-14, 47 y.o.   MRN: [id number]  HPI  She  is here for a physical; she denies acute issues except hot flashes.     Review of Systems She is not on a heart healthy diet; she is not exercising .She denies chest pain, palpitations, or claudication. She has some DOE; asthma is quiescent. Family history is positive for premature coronary disease in her mother. Advanced cholesterol testing reveals her LDL goal is less than 100, ideally < 70.Marland Kitchen There has been medication compliance with the statin since late 12/13. Significant memory deficit or myalgias denied.  She describes discomfort in the epigastric area intermittently described as "hardness" w/o hematemesis or dyspepsia. She's had a 20 pound weight and since she stopped smoking in December. She does not have melena or rectal bleeding. .     Objective:   Physical Exam Gen.: Well-nourished in appearance. Alert, appropriate and cooperative throughout exam. Head: Normocephalic without obvious abnormalities  Eyes: No corneal or conjunctival inflammation noted. Pupils equal round reactive to light and accommodation. Fundal exam is benign without hemorrhages, exudate, papilledema. Extraocular motion intact. Vision grossly normal without lenses. Xanthelasma OS upper lid. Ears: External  ear exam reveals no significant lesions or deformities. Canals clear .TMs normal. Hearing is grossly normal bilaterally. Nose: External nasal exam reveals no deformity or inflammation. Nasal mucosa are pink and moist. No lesions or exudates noted.  Mouth: Oral mucosa and oropharynx reveal no lesions or exudates. Teeth in good repair. Neck: No deformities, masses, or tenderness noted. Range of motion & Thyroid normal. Lungs: Normal respiratory effort; chest expands symmetrically. Lungs are clear to auscultation without rales, wheezes, or increased work of breathing. Heart: Normal rate and rhythm. Normal S1 and  S2. No gallop, click, or rub. Grade 1/2 over 6 systolic murmur. Abdomen: Bowel sounds normal; abdomen soft and nontender. No masses, organomegaly or hernias noted. Genitalia: As per Gyn, Dr Arlyce Dice                                 Musculoskeletal/extremities: No deformity or scoliosis noted of  the thoracic or lumbar spine.  No clubbing, cyanosis, edema, or significant extremity  deformity noted. Range of motion normal .Tone & strength  Normal. Joints normal . Nail health good. Able to lie down & sit up w/o help. Negative SLR bilaterally Vascular: Carotid, radial artery, dorsalis pedis and  posterior tibial pulses are full and equal. No bruits present. Neurologic: Alert and oriented x3. Deep tendon reflexes symmetrical and normal.       Skin: Intact without suspicious lesions or rashes. Lymph: No cervical, axillary lymphadenopathy present. Psych: Mood and affect are normal. Normally interactive                                                                                        Assessment & Plan:  #1 comprehensive physical exam; no acute findings #2 epigastric pain, recurrent, intermittent #3 colon polyps ; ?due for F/U #4 RBBB; new  Plan: see Orders  & Recommendations

## 2012-03-29 NOTE — Patient Instructions (Addendum)
Preventive Health Care: Exercise  30-45  minutes a day, 3-4 days a week. Walking is especially valuable in preventing Osteoporosis. Eat a low-fat diet with lots of fruits and vegetables, up to 7-9 servings per day. This would eliminate need for vitamin supplements for most individuals. Consume less than 30 grams of sugar per day from foods & drinks with High Fructose Corn Syrup as #2,3 or #4 on label. The legal document "Health Care Power of Attorney & Living Will " verifies decisions concerning your health care. Reflux of gastric acid may be asymptomatic as this may occur mainly during sleep.The triggers for reflux  include stress; the "aspirin family" ; alcohol; peppermint; and caffeine (coffee, tea, cola, and chocolate). The aspirin family would include aspirin and the nonsteroidal agents such as ibuprofen &  Naproxen. Tylenol would not cause reflux. If having symptoms ; food & drink should be avoided for @ least 2 hours before going to bed.  Please complete and return stool cards; these will determine whether there is any gastrointestinal bleeding risk. Take the EKG to any emergency room or preop visits. There are nonspecific changes; as long as there is no new change these are not clinically significant . If the old EKG is not available for comparison; it may result in unnecessary hospitalization for observation with significant unnecessary expense. If you activate the  My Chart system; lab & Xray results will be released directly  to you as soon as I review & address these through the computer. As per [employer] all records must be reviewed and signed off within 72 hours; but I attempt to complete this within 36 hours unless I have no access to the electronic medical record.If I wait more than 36 hours the volume of reports becomes difficult to manage optimally. In my absence ;my partners would address the results.Critical lab results will be communicated to you ASAP. If you choose not to sign up  for My Chart within 36 hours of labs being drawn; results will be reviewed & interpretation added before being copied & mailed, causing a delay in getting the results to you.If you do not receive that report within 7-10 days ,please call. Additionally you can use this system to gain direct  access to your records  if  out of town or @ an office of a  physician who is not in  the My Chart network.  This improves continuity of care & places you in control of your medical record.

## 2012-03-30 LAB — HEPATIC FUNCTION PANEL
ALT: 24 U/L (ref 0–35)
AST: 26 U/L (ref 0–37)
Albumin: 4.1 g/dL (ref 3.5–5.2)
Alkaline Phosphatase: 52 U/L (ref 39–117)
Bilirubin, Direct: 0 mg/dL (ref 0.0–0.3)
Total Bilirubin: 0.5 mg/dL (ref 0.3–1.2)
Total Protein: 7.2 g/dL (ref 6.0–8.3)

## 2012-03-30 LAB — LIPID PANEL
Cholesterol: 175 mg/dL (ref 0–200)
HDL: 39.4 mg/dL (ref >39.00)
LDL Cholesterol: 104 mg/dL — ABNORMAL HIGH (ref 0–99)
Triglycerides: 159 mg/dL — ABNORMAL HIGH (ref 0.0–149.0)
VLDL: 31.8 mg/dL (ref 0.0–40.0)

## 2012-03-30 LAB — BASIC METABOLIC PANEL WITH GFR
BUN: 7 mg/dL (ref 6–23)
CO2: 22 meq/L (ref 19–32)
Calcium: 9.1 mg/dL (ref 8.4–10.5)
Chloride: 104 meq/L (ref 96–112)
Creatinine, Ser: 0.7 mg/dL (ref 0.4–1.2)
GFR: 100.12 mL/min
Glucose, Bld: 79 mg/dL (ref 70–99)
Potassium: 4.2 meq/L (ref 3.5–5.1)
Sodium: 139 meq/L (ref 135–145)

## 2012-04-20 HISTORY — PX: UPPER GI ENDOSCOPY: SHX6162

## 2012-05-24 ENCOUNTER — Other Ambulatory Visit: Payer: Self-pay | Admitting: Internal Medicine

## 2012-07-15 ENCOUNTER — Ambulatory Visit (INDEPENDENT_AMBULATORY_CARE_PROVIDER_SITE_OTHER): Payer: Commercial Managed Care - PPO | Admitting: Internal Medicine

## 2012-07-15 ENCOUNTER — Encounter: Payer: Self-pay | Admitting: Internal Medicine

## 2012-07-15 VITALS — BP 140/88 | HR 66 | Temp 98.4°F | Wt 201.0 lb

## 2012-07-15 DIAGNOSIS — R599 Enlarged lymph nodes, unspecified: Secondary | ICD-10-CM

## 2012-07-15 DIAGNOSIS — R590 Localized enlarged lymph nodes: Secondary | ICD-10-CM

## 2012-07-15 DIAGNOSIS — K117 Disturbances of salivary secretion: Secondary | ICD-10-CM

## 2012-07-15 DIAGNOSIS — H04123 Dry eye syndrome of bilateral lacrimal glands: Secondary | ICD-10-CM

## 2012-07-15 DIAGNOSIS — E785 Hyperlipidemia, unspecified: Secondary | ICD-10-CM

## 2012-07-15 DIAGNOSIS — R635 Abnormal weight gain: Secondary | ICD-10-CM

## 2012-07-15 DIAGNOSIS — H04129 Dry eye syndrome of unspecified lacrimal gland: Secondary | ICD-10-CM

## 2012-07-15 LAB — CBC WITH DIFFERENTIAL/PLATELET
Basophils Relative: 1.7 % (ref 0.0–3.0)
Eosinophils Absolute: 0.2 10*3/uL (ref 0.0–0.7)
MCHC: 33.5 g/dL (ref 30.0–36.0)
MCV: 95 fl (ref 78.0–100.0)
Monocytes Absolute: 0.6 10*3/uL (ref 0.1–1.0)
Neutrophils Relative %: 68.7 % (ref 43.0–77.0)
Platelets: 340 10*3/uL (ref 150.0–400.0)
RBC: 4.36 Mil/uL (ref 3.87–5.11)

## 2012-07-15 MED ORDER — ZOLPIDEM TARTRATE 10 MG PO TABS: 10.0000 mg | ORAL_TABLET | ORAL | Status: DC | PRN

## 2012-07-15 MED ORDER — IPRATROPIUM-ALBUTEROL 0.5-2.5 (3) MG/3ML IN SOLN: 3.0000 mL | RESPIRATORY_TRACT | Status: DC | PRN

## 2012-07-15 NOTE — Patient Instructions (Addendum)
The most common cause of elevated triglycerides (TG) is the ingestion of sugar from high fructose corn syrup sources added to processed foods & drinks.  Eat a low-fat diet with lots of fruits and vegetables, up to 7-9 servings per day. Consume less than  30  Grams (preferably ZERO) of sugar per day from foods & drinks with High Fructose Corn Syrup (HFCS) sugar as #1,2,3 or # 4 on label.Whole [employer], [employer] & [employer] do not carry products with HFCS. To prevent sleep dysfunction follow these instructions for sleep hygiene. Do not read, watch TV, or eat in bed. Do not get into bed until you are ready to turn off the light &  to go to sleep. Do not ingest stimulants ( decongestants, diet pills, nicotine, caffeine) after the evening meal.Do not take daytime naps. Cardiovascular exercise, this can be as simple a program as walking, is recommended 30-45 minutes 3-4 times per week. If you're not exercising you should take 6-8 weeks to build up to this level.  Please have followup ophthalmologic and dental appointments to have the dry eyes and dry mouth symptoms evaluated optimally. Share results with all non [facility] medical staff seen .

## 2012-07-15 NOTE — Progress Notes (Signed)
Subjective:    Patient ID: Amanda [name], female    DOB: 13-Aug-1965, 47 y.o.   MRN: [id number]  HPI  She describes bilateral submandibular lymphadenopathy over the past week associated with dry mouth and sublingual rawness.  Other positive symptoms include a 30 pound weight gain over the last several months. She's had night sweats for 6 months. She has dry eyes and also minor blurring of vision  She describes some intermittent dysphagia ;this is in the context of known GERD. She also has minor hoarseness. She has not been using Dulera recently  She's had variable stools, mainly loose.  She has had an extensive GI evaluation including colonoscopy and upper endoscopy in April of this year. Apparently she had some esophageal lesions for which Prilosec 20 mg daily was recommended.      Review of Systems  She is on narcotic medication as needed for bulging disc at C6 from Dr. Ethelene Hal  She denies any significant cough or sputum production.  She is not having significant arthritic symptoms of pain or swelling. She has no significant rash.  She did not realize she had reflux until she was started on Prilosec. She is sleeping with head of bed elevated    Objective:   Physical Exam   Gen.:  well-nourished in appearance. Alert, appropriate and cooperative throughout exam.  Head: Normocephalic without obvious abnormalities Eyes: No corneal or conjunctival inflammation noted. No lid lag or proptosis. Extraocular motion intact. Xanthelasma OS upper lid Ears: External  ear exam reveals no significant lesions or deformities. Canals clear .TMs normal. Hearing is grossly normal bilaterally. Nose: External nasal exam reveals no deformity or inflammation. Nasal mucosa are pink and moist. No lesions or exudates noted.  Mouth: Oral mucosa and oropharynx reveal no lesions or exudates. Teeth in good repair. Neck: No deformities, masses, or tenderness noted.  Thyroid normal. Lungs: Normal respiratory  effort; chest expands symmetrically. Lungs are clear to auscultation without rales, wheezes, or increased work of breathing. Heart: Normal rate and rhythm. Normal S1 and S2. No gallop, click, or rub. No murmur. S4 Abdomen: Bowel sounds normal; abdomen soft and nontender. No masses, organomegaly or hernias noted.                                  Musculoskeletal/extremities: No clubbing, cyanosis, edema, or significant extremity  deformity noted. Range of motion normal .Tone & strength  Normal. Joints normal . Nail health good. Able to lie down & sit up w/o help.  Vascular: Carotid, radial artery, dorsalis pedis and  posterior tibial pulses are full and equal. No bruits present. Neurologic: Alert and oriented x3. Deep tendon reflexes symmetrical and normal.        Skin: Intact without suspicious lesions or rashes. Lymph: No cervical, axillary lymphadenopathy present. Psych: Mood and affect are normal. Normally interactive                                                                                      Assessment & Plan:  #1 submandibular  lymphadenopathy subjectively; none found clinically  #2 subjective dry mouth  #  3 subjective dry eyes  #4 weight gain  #5 reflux  #6 irritable bowel  #7 dyslipidemia  Plan: See orders and recommendations. Probiotic recommended for loose stools as needed.

## 2012-07-21 ENCOUNTER — Telehealth: Payer: Self-pay | Admitting: *Deleted

## 2012-07-21 NOTE — Telephone Encounter (Signed)
This should be assessed by rechecking these test in 3 months to verify that this does not progress. The code would be 794.5. No other intervention is indicated unless there is progressive decrease

## 2012-07-21 NOTE — Telephone Encounter (Signed)
Pt states that after reviewing the lab result online she notice that her TSH, T4 free were on the lower end and have been dropping for awhile.Pt would like to know if hopp has a treatment plan. .Please advise on plan of action for lab results

## 2012-07-21 NOTE — Telephone Encounter (Signed)
Left message to call office

## 2012-07-22 NOTE — Telephone Encounter (Signed)
The TSH level is determined by the pituitary interacting with the thyroid. This process takes a minimum of 8 weeks. The values could be checked in 8 weeks; but 10-12 weeks gives a better assessment of stability.

## 2012-07-22 NOTE — Telephone Encounter (Signed)
Spoke with patient and updated on this information. Verbalized understanding however, Pt. States she would like to schedule a follow up visit with Dr. Alwyn Ren, would like to call back to schedule.

## 2012-07-22 NOTE — Telephone Encounter (Signed)
Discussed this with patient. She is concerned about her continued weight gain and was concerned about waiting 3 months to recheck. Please advise. Thanks.

## 2012-07-22 NOTE — Telephone Encounter (Signed)
Pt returned Felicia's call. She states she has more questions regarding treatment for her thyroid. CB# [phone]

## 2012-08-23 ENCOUNTER — Other Ambulatory Visit: Payer: Self-pay | Admitting: Internal Medicine

## 2012-10-25 ENCOUNTER — Other Ambulatory Visit: Payer: Self-pay | Admitting: Internal Medicine

## 2012-10-25 NOTE — Telephone Encounter (Signed)
Celexa and ProAir refills sent to pharmacy

## 2012-10-26 ENCOUNTER — Other Ambulatory Visit: Payer: Self-pay | Admitting: *Deleted

## 2012-10-26 MED ORDER — ATENOLOL 50 MG PO TABS: ORAL_TABLET | ORAL | Status: DC

## 2012-10-26 NOTE — Telephone Encounter (Signed)
Atenolol refill sent to pharmacy 

## 2012-11-03 ENCOUNTER — Other Ambulatory Visit (INDEPENDENT_AMBULATORY_CARE_PROVIDER_SITE_OTHER): Payer: Commercial Managed Care - PPO

## 2012-11-03 DIAGNOSIS — T887XXA Unspecified adverse effect of drug or medicament, initial encounter: Secondary | ICD-10-CM

## 2012-11-03 DIAGNOSIS — R946 Abnormal results of thyroid function studies: Secondary | ICD-10-CM

## 2012-11-03 DIAGNOSIS — E785 Hyperlipidemia, unspecified: Secondary | ICD-10-CM

## 2012-11-03 LAB — TSH: TSH: 0.43 u[IU]/mL (ref 0.35–5.50)

## 2012-11-03 NOTE — Addendum Note (Signed)
Addended by: Silvio Pate D on: 11/03/2012 02:26 PM   Modules accepted: Orders

## 2012-11-04 LAB — LIPID PANEL
HDL: 42.4 mg/dL (ref >39.00)
Triglycerides: 274 mg/dL — ABNORMAL HIGH (ref 0.0–149.0)
VLDL: 54.8 mg/dL — ABNORMAL HIGH (ref 0.0–40.0)

## 2012-11-04 LAB — HEPATIC FUNCTION PANEL
ALT: 27 U/L (ref 0–35)
Total Protein: 7.3 g/dL (ref 6.0–8.3)

## 2012-11-04 LAB — CK: Total CK: 113 U/L (ref 7–177)

## 2012-11-08 ENCOUNTER — Encounter: Payer: Self-pay | Admitting: Internal Medicine

## 2012-11-08 ENCOUNTER — Ambulatory Visit (INDEPENDENT_AMBULATORY_CARE_PROVIDER_SITE_OTHER): Payer: Commercial Managed Care - PPO | Admitting: Internal Medicine

## 2012-11-08 VITALS — BP 138/78 | HR 77 | Temp 98.4°F | Wt 202.0 lb

## 2012-11-08 DIAGNOSIS — Z0000 Encounter for general adult medical examination without abnormal findings: Secondary | ICD-10-CM

## 2012-11-08 DIAGNOSIS — K219 Gastro-esophageal reflux disease without esophagitis: Secondary | ICD-10-CM | POA: Insufficient documentation

## 2012-11-08 DIAGNOSIS — E785 Hyperlipidemia, unspecified: Secondary | ICD-10-CM

## 2012-11-08 DIAGNOSIS — F329 Major depressive disorder, single episode, unspecified: Secondary | ICD-10-CM

## 2012-11-08 DIAGNOSIS — Z8679 Personal history of other diseases of the circulatory system: Secondary | ICD-10-CM

## 2012-11-08 MED ORDER — ATORVASTATIN CALCIUM 20 MG PO TABS: 20.0000 mg | ORAL_TABLET | Freq: Every day | ORAL | Status: DC

## 2012-11-08 MED ORDER — ATENOLOL 25 MG PO TABS: ORAL_TABLET | ORAL | Status: DC

## 2012-11-08 MED ORDER — CITALOPRAM HYDROBROMIDE 20 MG PO TABS: ORAL_TABLET | ORAL | Status: DC

## 2012-11-08 NOTE — Progress Notes (Signed)
  Subjective:    Patient ID: Amanda [name], female    DOB: [birth year], 47 y.o.   MRN: [id number]  HPI  She is here for a physical;acute issues include recurrence of tachycardia w/o trigger      Review of Systems She is on a heart healthy diet; she exercises as walking 10 minutes or less/ day 5 times per week without symptoms. Over past week she has had "fluttering " almost constantly. She had decreased Atenolol to once daily 6 months ago.PMH of ventricular tachycardia. She denies exertional chest pain, dyspnea, or claudication.  Family history is negative for premature coronary disease . Advanced cholesterol testing reveals her LDL goal is less than 100. Statin not refilled in 04/2012. .     Objective:   Physical Exam Gen.: well-nourished in appearance. Alert, appropriate and cooperative throughout exam.Appears younger than stated age  Head: Normocephalic without obvious abnormalities  Eyes: No corneal or conjunctival inflammation noted. Pupils equal round reactive to light and accommodation.  Extraocular motion intact.  Ears: External  ear exam reveals no significant lesions or deformities. Canals clear .TMs normal.  Nose: External nasal exam reveals no deformity or inflammation. Nasal mucosa are pink and moist. No lesions or exudates noted.   Mouth: Oral mucosa and oropharynx reveal no lesions or exudates. Teeth in good repair. Neck: No deformities, masses, or tenderness noted. Range of motion & Thyroid normal. Lungs: Normal respiratory effort; chest expands symmetrically. Lungs are clear to auscultation without rales, wheezes, or increased work of breathing. Heart: Normal rate and rhythm. Normal S1 ;accentuated S2. No gallop, click, or rub. No murmur. Abdomen: Bowel sounds normal; abdomen soft and nontender. No masses, organomegaly or hernias noted. Genitalia: As per Gyn                                  Musculoskeletal/extremities: No deformity or scoliosis noted of  the thoracic or lumbar  spine.  No clubbing, cyanosis, edema, or significant extremity  deformity noted. Range of motion normal .Tone & strength  Normal. Joints normal. Nail health good. Able to lie down & sit up w/o help. Negative SLR bilaterally Vascular: Carotid, radial artery, dorsalis pedis and  posterior tibial pulses are full and equal. No bruits present. Neurologic: Alert and oriented x3. Deep tendon reflexes symmetrical and normal.       Skin: Intact without suspicious lesions or rashes. Xanthelasma L upper lid Lymph: No cervical, axillary lymphadenopathy present. Psych: Mood and affect are normal. Normally interactive                                                                                        Assessment & Plan:  #1 comprehensive physical exam; no acute findings #2 tachycardia by history  Plan: see Orders  & Recommendations

## 2012-11-08 NOTE — Addendum Note (Signed)
Addended by: Baldwin Jamaica on: 11/08/2012 10:51 AM   Modules accepted: Orders

## 2012-11-08 NOTE — Patient Instructions (Signed)
The most common cause of elevated triglycerides (TG) is the ingestion of sugar from high fructose corn syrup sources added to processed foods & drinks.  Eat a low-fat diet with lots of fruits and vegetables, up to 7-9 servings per day. Consume less than  30  Grams (preferably ZERO) of sugar per day from foods & drinks with High Fructose Corn Syrup (HFCS) sugar as #1,2,3 or # 4 on label.Whole Foods, [employer] & [employer] do not carry products with HFCS. Follow a  low carb nutrition program such as [place] or [employer]  to prevent Diabetes . White carbohydrates (potatoes, rice, bread, and pasta) have a high spike of sugar and a high load of sugar. For example a  baked potato has a cup of sugar and a  french fry  2 teaspoons of sugar. Yams, wild  rice, whole grained bread &  wheat pasta have been much lower spike and load of  sugar. Portions should be the size of a deck of cards or your palm. To prevent tachycardia & palpitations or premature beats, avoid stimulants such as decongestants, diet pills, nicotine, or caffeine (coffee, tea, cola, or chocolate) to excess.

## 2012-12-16 ENCOUNTER — Encounter: Payer: Self-pay | Admitting: Internal Medicine

## 2012-12-16 DIAGNOSIS — M5030 Other cervical disc degeneration, unspecified cervical region: Secondary | ICD-10-CM | POA: Insufficient documentation

## 2013-01-05 ENCOUNTER — Other Ambulatory Visit: Payer: Self-pay | Admitting: Internal Medicine

## 2013-01-06 NOTE — Telephone Encounter (Signed)
Atorvastatin refilled per protocol. JG//CMA 

## 2013-04-27 ENCOUNTER — Other Ambulatory Visit: Payer: Self-pay | Admitting: *Deleted

## 2013-04-27 NOTE — Telephone Encounter (Signed)
Rx was denied pt needs an appt to discuss med dose.  Spoke with the pt and informed her of the Atenolol dose been different.  Pt stated that she may not be taking the correct dose.  Informed the pt that Dr. Linna Darner would like for her to schedule an appt to discuss her meds.  Pt understood and agreed.  Pt will call back to schedule an appt.//AB/CMA

## 2013-04-28 ENCOUNTER — Other Ambulatory Visit: Payer: Self-pay | Admitting: Internal Medicine

## 2013-04-28 NOTE — Telephone Encounter (Signed)
Rx denied pt needing appt with Dr. Linna Darner to discuss the correct dose on the Atenolol.  Pt aware.//AB/CMA

## 2013-06-20 ENCOUNTER — Ambulatory Visit (INDEPENDENT_AMBULATORY_CARE_PROVIDER_SITE_OTHER): Payer: 59 | Admitting: Internal Medicine

## 2013-06-20 ENCOUNTER — Telehealth: Payer: Self-pay | Admitting: Internal Medicine

## 2013-06-20 ENCOUNTER — Encounter: Payer: Self-pay | Admitting: Internal Medicine

## 2013-06-20 VITALS — BP 150/80 | HR 91 | Temp 98.1°F | Wt 213.8 lb

## 2013-06-20 DIAGNOSIS — F32A Depression, unspecified: Secondary | ICD-10-CM

## 2013-06-20 DIAGNOSIS — E785 Hyperlipidemia, unspecified: Secondary | ICD-10-CM

## 2013-06-20 DIAGNOSIS — F329 Major depressive disorder, single episode, unspecified: Secondary | ICD-10-CM

## 2013-06-20 DIAGNOSIS — I10 Essential (primary) hypertension: Secondary | ICD-10-CM

## 2013-06-20 DIAGNOSIS — J019 Acute sinusitis, unspecified: Secondary | ICD-10-CM

## 2013-06-20 DIAGNOSIS — F3289 Other specified depressive episodes: Secondary | ICD-10-CM

## 2013-06-20 MED ORDER — CLONAZEPAM 0.5 MG PO TABS: ORAL_TABLET | ORAL | Status: DC

## 2013-06-20 MED ORDER — PRAVASTATIN SODIUM 20 MG PO TABS: 20.0000 mg | ORAL_TABLET | Freq: Every day | ORAL | Status: DC

## 2013-06-20 MED ORDER — ALBUTEROL SULFATE HFA 108 (90 BASE) MCG/ACT IN AERS: INHALATION_SPRAY | RESPIRATORY_TRACT | Status: DC

## 2013-06-20 MED ORDER — AMOXICILLIN 500 MG PO CAPS: 500.0000 mg | ORAL_CAPSULE | Freq: Three times a day (TID) | ORAL | Status: DC

## 2013-06-20 MED ORDER — ATENOLOL 25 MG PO TABS: ORAL_TABLET | ORAL | Status: DC

## 2013-06-20 MED ORDER — CITALOPRAM HYDROBROMIDE 20 MG PO TABS: ORAL_TABLET | ORAL | Status: DC

## 2013-06-20 NOTE — Progress Notes (Signed)
   Subjective:    Patient ID: Amanda [name], female    DOB: Feb 16, 1965, 48 y.o.   MRN: [id number]  HPI    Her symptoms began Saturday 06/18/13 sd sore throat, rhinitis, and head congestion in the context of 4 flights from [place].  As of 5/31 she had a cough productive of scant yellow sputum.  She employed nonsteroidals, Mucinex, and Benadryl with minor response  At this time she continues to have frontal headache, facial pain, dental pain, sore throat, nasal purulence, and bilateral ear pain, left greater than right. She also describes fatigue, loss of smell, and halitosis.  Frontal headache described as a constant "not dull "headache up to level VI on a 10 scale  The rhinitis has disturbed her sleep  She quit smoking in December 2013.    Review of Systems  The cough is presently dry. She denies any associated wheezing or shortness of breath  She also has no fever, chills, sweats  Has no itchy, watery eyes, sneezing      Objective:   Physical Exam  There is erythema of the uvula; but it is not swollen  The nasal septum is deviated to the right. There is mild erythema but no exudate.  Grade one half systolic murmur at the right base.  General appearance:well nourished; weight excess.No acute distress or increased work of breathing is present.  No  lymphadenopathy about the head, neck, or axilla noted.   Eyes: No conjunctival inflammation or lid edema is present.   Ears:  External ear exam shows no significant lesions or deformities.  Otoscopic examination reveals clear canals, tympanic membranes are intact bilaterally without bulging, retraction, inflammation or discharge.  Nose:  External nasal examination shows no deformity or inflammation. Nasal mucosa are pink and moist without lesions or exudates.  Oral exam: Dental hygiene is good; lips and gums are healthy appearing. Neck:  No deformities, thyromegaly, masses, or tenderness noted.   Supple with full range of motion  without pain.   Heart:  Normal rate and regular rhythm. S1 and S2 normal without gallop,  click, rub or other extra sounds.   Lungs:Chest clear to auscultation; no wheezes, rhonchi,rales ,or rubs present.No increased work of breathing.    Extremities:  No cyanosis, edema, or clubbing  noted    Skin: Warm & dry         Assessment & Plan:  #1 acute sinusitis #2 HTN, goals discussed. Avoid decongestants. #3 med refills requested. Labs reviewed; informed Lipid recheck overdue. Pre Diabetes pattern with TG > 150 discussed.  Plan see orders and recommendations.

## 2013-06-20 NOTE — Progress Notes (Signed)
Pre visit review using our clinic review tool, if applicable. No additional management support is needed unless otherwise documented below in the visit note. 

## 2013-06-20 NOTE — Patient Instructions (Addendum)
Plain Mucinex (NOT D) for thick secretions ;force NON dairy fluids .   Nasal cleansing in the shower as discussed with lather of mild shampoo.After 10 seconds wash off lather while  exhaling through nostrils. Make sure that all residual soap is removed to prevent irritation.  Flonase OR Nasacort AQ 1 spray in each nostril twice a day as needed. Use the "crossover" technique into opposite nostril spraying toward opposite ear @ 45 degree angle, not straight up into nostril.  Use a Neti pot daily only  as needed for significant sinus congestion; going from open side to congested side . Plain Allegra (NOT D )  160 daily , Loratidine 10 mg , OR Zyrtec 10 mg @ bedtime  as needed for itchy eyes & sneezing. Minimal Blood Pressure Goal= AVERAGE < 140/90;  Ideal is an AVERAGE < 135/85. This AVERAGE should be calculated from @ least 5-7 BP readings taken @ different times of day on different days of week. You should not respond to isolated BP readings , but rather the AVERAGE for that week .Please bring your  blood pressure cuff to office visits to verify that it is reliable.It  can also be checked against the blood pressure device at the pharmacy. Finger or wrist cuffs are not dependable; an arm cuff is. The most common cause of elevated triglycerides (TG) is the ingestion of sugar from high fructose corn syrup sources added to processed foods & drinks.  Eat a low-fat diet with lots of fruits and vegetables, up to 7-9 servings per day. Consume less than  30  Grams (preferably ZERO) of sugar per day from foods & drinks with High Fructose Corn Syrup (HFCS) sugar as #1,2,3 or # 4 on label.Whole Foods, [employer] [place] do not carry products with HFCS.

## 2013-06-20 NOTE — Telephone Encounter (Signed)
Relevant patient education assigned to patient using Emmi. ° °

## 2013-06-28 ENCOUNTER — Other Ambulatory Visit: Payer: Self-pay | Admitting: Internal Medicine

## 2013-06-28 NOTE — Telephone Encounter (Signed)
#  30, R X 2 

## 2013-09-26 ENCOUNTER — Other Ambulatory Visit: Payer: Self-pay | Admitting: Internal Medicine

## 2013-12-26 ENCOUNTER — Other Ambulatory Visit: Payer: Self-pay

## 2013-12-26 NOTE — Telephone Encounter (Signed)
OK X 3 mos 

## 2013-12-27 MED ORDER — CITALOPRAM HYDROBROMIDE 20 MG PO TABS: ORAL_TABLET | ORAL | Status: DC

## 2014-03-28 ENCOUNTER — Encounter: Payer: Self-pay | Admitting: Internal Medicine

## 2014-03-31 ENCOUNTER — Other Ambulatory Visit: Payer: Self-pay | Admitting: Internal Medicine

## 2014-04-04 ENCOUNTER — Encounter: Payer: Self-pay | Admitting: Internal Medicine

## 2014-04-04 MED ORDER — CITALOPRAM HYDROBROMIDE 20 MG PO TABS: ORAL_TABLET | ORAL | Status: DC

## 2014-05-01 ENCOUNTER — Other Ambulatory Visit: Payer: Self-pay | Admitting: Internal Medicine

## 2014-05-01 NOTE — Telephone Encounter (Signed)
OK x 1 

## 2014-05-26 ENCOUNTER — Ambulatory Visit (INDEPENDENT_AMBULATORY_CARE_PROVIDER_SITE_OTHER): Payer: 59 | Admitting: Internal Medicine

## 2014-05-26 ENCOUNTER — Encounter: Payer: Self-pay | Admitting: Internal Medicine

## 2014-05-26 VITALS — BP 100/70 | HR 65 | Temp 98.5°F | Ht 64.75 in | Wt 207.0 lb

## 2014-05-26 DIAGNOSIS — I10 Essential (primary) hypertension: Secondary | ICD-10-CM

## 2014-05-26 DIAGNOSIS — F411 Generalized anxiety disorder: Secondary | ICD-10-CM | POA: Diagnosis not present

## 2014-05-26 DIAGNOSIS — E782 Mixed hyperlipidemia: Secondary | ICD-10-CM

## 2014-05-26 DIAGNOSIS — R739 Hyperglycemia, unspecified: Secondary | ICD-10-CM | POA: Diagnosis not present

## 2014-05-26 MED ORDER — ATENOLOL 25 MG PO TABS: ORAL_TABLET | ORAL | Status: DC

## 2014-05-26 MED ORDER — CITALOPRAM HYDROBROMIDE 20 MG PO TABS: ORAL_TABLET | ORAL | Status: DC

## 2014-05-26 MED ORDER — CLONAZEPAM 0.5 MG PO TABS: ORAL_TABLET | ORAL | Status: DC

## 2014-05-26 NOTE — Patient Instructions (Signed)
Please follow a Mediaterranean type diet  (many good cook books readily available) or review Dr Nunzio Cory book Eat, [place] for best  dietary cholesterol information & options.  The most common cause of elevated triglycerides (TG) is the ingestion of sugar from high fructose corn syrup sources added to processed foods & drinks.  Eat a low-fat diet with lots of fruits and vegetables, up to 7-9 servings per day. Consume less than 30 Grams (preferably ZERO) of sugar per day from foods & drinks with High Fructose Corn Syrup (HFCS) sugar as #1,2,3 or # 4 on label.Whole Foods, [employer] [place] do not carry products with HFCS. Continue excellent cardiovascular exercise program 30-45 minutes 3-4 times per week. An advanced cholesterol panel (NMR Lipoprofile Lipid Panel) after 4 months of nutritional & exercise changes is best way to optimally assess long term LDL risk.

## 2014-05-26 NOTE — Progress Notes (Signed)
Pre visit review using our clinic review tool, if applicable. No additional management support is needed unless otherwise documented below in the visit note. 

## 2014-05-26 NOTE — Progress Notes (Signed)
   Subjective:    Patient ID: Amanda [name], female    DOB: [birth year], 49 y.o.   MRN: [id number]  HPI The patient is here to assess status of active health conditions.  PMH, FH, & Social History reviewed & updated.  Off Celexa she was tearful and emotionally labile. There was increased anxiety and possible panic attacks. This was associated with poor sleep. This was described as " inability to turn her brain off".  She's not monitoring blood pressure but has been compliant with her medicines. She does follow a heart healthy, low-salt diet. In the last 6 weeks she started walking. Initially she had exertional dyspnea but this has improved and she is essentially asymptomatic. She also quit smoking in 2013.  She's been off her statin for 9 months. Her last lipids on record include an LDL of 202.9; triglycerides 274; HDL of 42.4 in October 2014.  Renal function was normal in 2014. She states labs being done @ her work place.  She's had coloscopic surveillance because family history. Colonoscopy was negative 2014. She has no active GI symptoms. Her maternal uncle did have a heart attack over 3.  Review of Systems  Chest pain, palpitations, tachycardia, paroxysmal nocturnal dyspnea, claudication or edema are absent.  Unexplained weight loss, abdominal pain, significant dyspepsia, dysphagia, melena, rectal bleeding, or persistently small caliber stools are denied.    Objective:   Physical Exam  Pertinent or positive findings include:  Small xanthelasma over the left upper lid.  General appearance :adequately nourished; in no distress. BMI: 34.7 Eyes: No conjunctival inflammation or scleral icterus is present. Oral exam:  Lips and gums are healthy appearing.There is no oropharyngeal erythema or exudate noted. Dental hygiene is good. Heart:  Normal rate and regular rhythm. S1 and S2 normal without gallop, murmur, click, or rub .S4  Lungs:Chest clear to auscultation; no wheezes,  rhonchi,rales ,or rubs present.No increased work of breathing.  Abdomen: Protuberant;bowel sounds normal, soft and non-tender without masses, organomegaly or hernias noted.  No guarding or rebound.  Vascular : all pulses equal ; no bruits present. Skin:Warm & dry.  Intact without suspicious lesions (see OS  eyelid xanthelasma) or rashes ; no tenting or jaundice  Lymphatic: No lymphadenopathy is noted about the head, neck, axilla.  Neuro: Strength, tone & DTRs normal.        Assessment & Plan:  See Current Assessment & Plan in Problem List under specific Diagnosis

## 2014-05-26 NOTE — Assessment & Plan Note (Signed)
A1c in 3 mos

## 2014-05-26 NOTE — Assessment & Plan Note (Signed)
Celexa X 6 mos

## 2014-05-26 NOTE — Assessment & Plan Note (Signed)
NMR Lipoprofile, LFT, TSH after 3 mos TLC

## 2014-10-16 ENCOUNTER — Other Ambulatory Visit: Payer: Self-pay | Admitting: Internal Medicine

## 2014-10-19 ENCOUNTER — Other Ambulatory Visit: Payer: Self-pay | Admitting: Internal Medicine

## 2014-10-19 ENCOUNTER — Encounter: Payer: Self-pay | Admitting: Internal Medicine

## 2014-10-23 ENCOUNTER — Telehealth: Payer: Self-pay | Admitting: Emergency Medicine

## 2014-10-23 NOTE — Telephone Encounter (Signed)
pts husband has called and her wheezing is getting really bad that he's almost ready to take her to the ED. He states the insurance told them they will cover but they need the PA. He also stated the pt has tried all the other kinds and the [facility] gives her the best relief.  Can you please call pt at [phone] to keep her updated. Pharmacy is [facility] in [place]

## 2014-10-23 NOTE — Telephone Encounter (Signed)
No Dulera samples in office; but Symbicort sample and coupon for ProAir RespiClick can be picked up. She'll need OV if no better

## 2014-10-23 NOTE — Telephone Encounter (Signed)
[employer] called to inform that Amanda [name] is not covered through pts insurance plan. Other options that pt could try are Alves[place], Advair, Advair HFA, Asthmanex. Please advise.

## 2014-10-23 NOTE — Telephone Encounter (Signed)
Ruthe Mannan PA has been approved through [employer]. Pt and pharm have been notified.

## 2014-10-31 NOTE — Addendum Note (Signed)
Addended by: Earnstine Regal on: 10/31/2014 03:45 PM   Modules accepted: Orders

## 2014-11-16 ENCOUNTER — Other Ambulatory Visit: Payer: Self-pay | Admitting: Internal Medicine

## 2015-02-09 ENCOUNTER — Other Ambulatory Visit: Payer: Self-pay

## 2015-02-09 ENCOUNTER — Other Ambulatory Visit: Payer: Self-pay | Admitting: Internal Medicine

## 2015-02-09 ENCOUNTER — Telehealth: Payer: Self-pay

## 2015-02-09 MED ORDER — ALBUTEROL SULFATE HFA 108 (90 BASE) MCG/ACT IN AERS: INHALATION_SPRAY | RESPIRATORY_TRACT | Status: DC

## 2015-02-09 NOTE — Telephone Encounter (Signed)
Patient wants to establish with stacy burns--she is to look at her calendar and call back for appt---i am sending rx refills for inhaler to her pharm today---patient needs appt by end of march/1st of April if possible in order to keep refilling her bp meds and inhalers---patient repeated back for understanding and will call back soon to schedule appt with dr burns to get established

## 2015-02-12 ENCOUNTER — Other Ambulatory Visit: Payer: Self-pay | Admitting: Internal Medicine

## 2015-02-19 ENCOUNTER — Other Ambulatory Visit: Payer: Self-pay | Admitting: Internal Medicine

## 2015-02-19 NOTE — Telephone Encounter (Signed)
Ok to refill 

## 2015-02-19 NOTE — Telephone Encounter (Signed)
Patient has already been made aware to call back to schedule appt soon to establish care with dr Marzetta Board burns/new pcp---routing to dr burns for advisement---are you ok with refilling for 30 days, please advise, thanks

## 2015-03-21 ENCOUNTER — Other Ambulatory Visit: Payer: Self-pay | Admitting: Internal Medicine

## 2015-04-18 ENCOUNTER — Other Ambulatory Visit: Payer: Self-pay | Admitting: Internal Medicine

## 2015-04-23 ENCOUNTER — Encounter: Payer: Self-pay | Admitting: Internal Medicine

## 2015-04-23 ENCOUNTER — Ambulatory Visit (INDEPENDENT_AMBULATORY_CARE_PROVIDER_SITE_OTHER): Payer: 59 | Admitting: Internal Medicine

## 2015-04-23 ENCOUNTER — Other Ambulatory Visit (INDEPENDENT_AMBULATORY_CARE_PROVIDER_SITE_OTHER): Payer: 59

## 2015-04-23 VITALS — BP 128/80 | HR 77 | Temp 98.0°F | Resp 16 | Wt 216.0 lb

## 2015-04-23 DIAGNOSIS — J45909 Unspecified asthma, uncomplicated: Secondary | ICD-10-CM | POA: Diagnosis not present

## 2015-04-23 DIAGNOSIS — K219 Gastro-esophageal reflux disease without esophagitis: Secondary | ICD-10-CM

## 2015-04-23 DIAGNOSIS — E782 Mixed hyperlipidemia: Secondary | ICD-10-CM

## 2015-04-23 DIAGNOSIS — R739 Hyperglycemia, unspecified: Secondary | ICD-10-CM

## 2015-04-23 DIAGNOSIS — F411 Generalized anxiety disorder: Secondary | ICD-10-CM

## 2015-04-23 DIAGNOSIS — M5030 Other cervical disc degeneration, unspecified cervical region: Secondary | ICD-10-CM

## 2015-04-23 DIAGNOSIS — Z8679 Personal history of other diseases of the circulatory system: Secondary | ICD-10-CM

## 2015-04-23 LAB — COMPREHENSIVE METABOLIC PANEL
ALT: 20 U/L (ref 0–35)
AST: 19 U/L (ref 0–37)
Albumin: 4.3 g/dL (ref 3.5–5.2)
Alkaline Phosphatase: 67 U/L (ref 39–117)
BILIRUBIN TOTAL: 0.3 mg/dL (ref 0.2–1.2)
BUN: 8 mg/dL (ref 6–23)
CO2: 32 meq/L (ref 19–32)
CREATININE: 0.73 mg/dL (ref 0.40–1.20)
Calcium: 9.6 mg/dL (ref 8.4–10.5)
Chloride: 105 mEq/L (ref 96–112)
GFR: 89.54 mL/min (ref >60.00)
GLUCOSE: 101 mg/dL — AB (ref 70–99)
Potassium: 4.7 mEq/L (ref 3.5–5.1)
SODIUM: 143 meq/L (ref 135–145)
Total Protein: 7.6 g/dL (ref 6.0–8.3)

## 2015-04-23 LAB — LIPID PANEL
CHOL/HDL RATIO: 5
Cholesterol: 253 mg/dL — ABNORMAL HIGH (ref 0–200)
HDL: 46.5 mg/dL (ref >39.00)
LDL CALC: 170 mg/dL — AB (ref 0–99)
NonHDL: 206.46
TRIGLYCERIDES: 181 mg/dL — AB (ref 0.0–149.0)
VLDL: 36.2 mg/dL (ref 0.0–40.0)

## 2015-04-23 LAB — HEMOGLOBIN A1C: Hgb A1c MFr Bld: 6.2 % (ref 4.6–6.5)

## 2015-04-23 LAB — CBC WITH DIFFERENTIAL/PLATELET
BASOS ABS: 0 10*3/uL (ref 0.0–0.1)
Basophils Relative: 0.4 % (ref 0.0–3.0)
EOS ABS: 0.3 10*3/uL (ref 0.0–0.7)
Eosinophils Relative: 4.2 % (ref 0.0–5.0)
HEMATOCRIT: 41.5 % (ref 36.0–46.0)
Hemoglobin: 13.9 g/dL (ref 12.0–15.0)
LYMPHS PCT: 26.3 % (ref 12.0–46.0)
Lymphs Abs: 1.9 10*3/uL (ref 0.7–4.0)
MCHC: 33.5 g/dL (ref 30.0–36.0)
MCV: 89.8 fl (ref 78.0–100.0)
Monocytes Absolute: 0.5 10*3/uL (ref 0.1–1.0)
Monocytes Relative: 6.8 % (ref 3.0–12.0)
NEUTROS ABS: 4.6 10*3/uL (ref 1.4–7.7)
NEUTROS PCT: 62.3 % (ref 43.0–77.0)
PLATELETS: 404 10*3/uL — AB (ref 150.0–400.0)
RBC: 4.62 Mil/uL (ref 3.87–5.11)
RDW: 14 % (ref 11.5–15.5)
WBC: 7.4 10*3/uL (ref 4.0–10.5)

## 2015-04-23 LAB — TSH: TSH: 0.65 u[IU]/mL (ref 0.35–4.50)

## 2015-04-23 MED ORDER — CITALOPRAM HYDROBROMIDE 20 MG PO TABS: 20.0000 mg | ORAL_TABLET | Freq: Every day | ORAL | Status: DC

## 2015-04-23 MED ORDER — MOMETASONE FURO-FORMOTEROL FUM 200-5 MCG/ACT IN AERO: 2.0000 | INHALATION_SPRAY | Freq: Two times a day (BID) | RESPIRATORY_TRACT | Status: DC

## 2015-04-23 MED ORDER — FAMOTIDINE 20 MG PO TABS: 20.0000 mg | ORAL_TABLET | Freq: Every day | ORAL | Status: DC | PRN

## 2015-04-23 MED ORDER — CLONAZEPAM 0.5 MG PO TABS: ORAL_TABLET | ORAL | Status: DC

## 2015-04-23 MED ORDER — ATENOLOL 25 MG PO TABS: ORAL_TABLET | ORAL | Status: DC

## 2015-04-23 MED ORDER — ALBUTEROL SULFATE HFA 108 (90 BASE) MCG/ACT IN AERS: INHALATION_SPRAY | RESPIRATORY_TRACT | Status: DC

## 2015-04-23 NOTE — Progress Notes (Signed)
Subjective:    Patient ID: Amanda [name], female    DOB: [birth year], 50 y.o.   MRN: [id number]  HPI She is here to establish with a new pcp.  She is here for follow up.   Anxiety: She is taking her medication daily as prescribed. She denies any side effects from the medication. She feels her anxiety is well controlled and she is happy with her current dose of medication.  She uses the clonazepam as needed, less than once a week.  Politics is the biggest stress in her life.   Asthma:  She uses her dulera only during the high allergy seasons and she doe snot need the albuterol at that time.  Otherwise she uses the albuterol only as needed - at most once a week.    Cervical DDD and bulging disc:  She has chronic neck pain.  She is following with Dr. Nelva Bush, who prescribes her medication.  She takes the medication daily and it controls her pain. She would ideally like to avoid surgery.   Palpitations/flutters:  She is taking atenolol daily and this controls her palpitations.  She saw cardiology years ago.    She is currently not exercising. She knows she needs to lose weight.   Medications and allergies reviewed with patient and updated if appropriate.  Patient Active Problem List   Diagnosis Date Noted  . Anxiety state 05/26/2014  . DDD (degenerative disc disease), cervical 12/16/2012  . GERD (gastroesophageal reflux disease) 11/08/2012  . H/O ventricular tachycardia 11/08/2012  . RBBB 03/29/2012  . HERPES ZOSTER 02/11/2010  . COLONIC POLYPS, HX OF 02/04/2010  . OVARIAN CYST, LEFT 11/17/2008  . Mixed hyperlipidemia 09/27/2008  . ALLERGIC RHINITIS 09/27/2008  . Asthma 09/27/2008  . Hyperglycemia 09/27/2008    Current Outpatient Prescriptions on File Prior to Visit  Medication Sig Dispense Refill  . albuterol (VENTOLIN HFA) 108 (90 Base) MCG/ACT inhaler INHALE TWO PUFFS BY MOUTH EVERY 4 HOURS AS NEEDED 18 g 1  . atenolol (TENORMIN) 25 MG tablet TAKE 1 TABLET(25 MG) BY MOUTH TWICE  DAILY 180 tablet 0  . citalopram (CELEXA) 20 MG tablet Take 1 tablet (20 mg total) by mouth daily. ---patient must keep appt with dr Marzetta Board burns before any further refills 60 tablet 0  . clonazePAM (KLONOPIN) 0.5 MG tablet TAKE 1/2 TO 1 TABLET BY MOUTH EVERY 8-12 HOURS AS NEEDED 30 tablet 1  . HYDROcodone-acetaminophen (NORCO) 10-325 MG per tablet Take 1 tablet by mouth. 1 by mouth every 4-6 hours as needed    . mometasone-formoterol (DULERA) 200-5 MCG/ACT AERO Inhale 2 puffs into the lungs 2 (two) times daily. Keep April appt for future refills 13 g 0   No current facility-administered medications on file prior to visit.    Past Medical History  Diagnosis Date  . Tachycardia, paroxysmal     Dr Caryl Comes  . Asthma     extrinsic  . Allergy     rhinitis, seasonal  . Hyperglycemia     borderline  . Hyperlipidemia     borderline  . Hx of colonic polyps 2010    hyperplastic  . Hemorrhoids   . Anxiety   . GERD (gastroesophageal reflux disease)     Past Surgical History  Procedure Laterality Date  . Abdominal hysterectomy  2004    For Abnormal Pap & painful menses, Dr Deatra Ina  . Wisdom tooth extraction  02/13/2009  . Colonoscopy w/ polypectomy  11/2008    Dr Oletta Lamas  .  Esi      X 2; Dr Nelva Bush  . Upper gi endoscopy  04/2012    esophageal erosions ; Dr Oletta Lamas    Social History   Social History  . Marital Status: Married    Spouse Name: N/A  . Number of Children: N/A  . Years of Education: N/A   Social History Main Topics  . Smoking status: Former Smoker    Types: Cigarettes    Quit date: 12/25/2011  . Smokeless tobacco: Not on file     Comment:  smoked 1982-2013,less than 1 pack a day   . Alcohol Use: Yes     Comment: Rarely  . Drug Use: No  . Sexual Activity: Yes    Birth Control/ Protection: None   Other Topics Concern  . Not on file   Social History Narrative   Regular exercise- no    Family History  Problem Relation Age of Onset  . Colon cancer Father      Stage 4  . Diabetes Father   . Colon cancer Paternal Uncle   . Lung cancer Paternal Grandfather   . Colon cancer Paternal Grandfather   . Coronary artery disease Mother     angioplasty in late 39s  . Hyperlipidemia Mother   . Breast cancer Other     MGAunts  . Depression Brother   . Bipolar disorder Brother   . Diabetes Brother   . Stroke Neg Hx     Review of Systems  Constitutional: Negative for fever and chills.  Respiratory: Negative for cough, shortness of breath and wheezing.   Cardiovascular: Positive for palpitations (with anxiety only). Negative for chest pain and leg swelling.  Gastrointestinal: Negative for nausea and abdominal pain.       Gerd  Neurological: Negative for dizziness, light-headedness and headaches.  Psychiatric/Behavioral: Negative for dysphoric mood. The patient is nervous/anxious.        Objective:   Filed Vitals:   04/23/15 0814  BP: 128/80  Pulse: 77  Temp: 98 F (36.7 C)  Resp: 16   Filed Weights   04/23/15 0814  Weight: 216 lb (97.977 kg)   Body mass index is 36.21 kg/(m^2).   Physical Exam Constitutional: Appears well-developed and well-nourished. No distress.  Neck: Neck supple. No tracheal deviation present. No thyromegaly present.  No carotid bruit. No cervical adenopathy.   Cardiovascular: Normal rate, regular rhythm and normal heart sounds.   No murmur heard.  No edema Pulmonary/Chest: Effort normal and breath sounds normal. No respiratory distress. No wheezes.  Psych:  Normal mood and affect       Assessment & Plan:   See Problem List for Assessment and Plan of chronic medical problems.  Blood work today  Follow up annually

## 2015-04-23 NOTE — Assessment & Plan Note (Signed)
Uses dulera during spring and fall only for allergies Uses albuterol as needed other times of the year Well controlled  continue above

## 2015-04-23 NOTE — Assessment & Plan Note (Signed)
Takes otc pepcid as needed

## 2015-04-23 NOTE — Patient Instructions (Addendum)
  Test(s) ordered today. Your results will be released to [facility] (or called to you) after review, usually within 72hours after test completion. If any changes need to be made, you will be notified at that same time.  All other Health Maintenance issues reviewed.   All recommended immunizations and age-appropriate screenings are up-to-date or discussed.  No immunizations administered today.   Medications reviewed and updated.  No changes recommended at this time.  Your prescription(s) have been submitted to your pharmacy. Please take as directed and contact our office if you believe you are having problem(s) with the medication(s).   Please followup annually

## 2015-04-23 NOTE — Assessment & Plan Note (Signed)
celexa daily Uses clonazepam as needed - at most once a week - politics is the most stressful thing in her life at this time

## 2015-04-23 NOTE — Assessment & Plan Note (Signed)
Did not tolerate pravastatin Will recheck lipids Start exercising, work on weight loss Consider trying a different statin

## 2015-04-23 NOTE — Assessment & Plan Note (Signed)
Feels palpitations Taking atenolol daily which controls symptoms Continue above

## 2015-04-23 NOTE — Assessment & Plan Note (Signed)
Taking Norco 3-4 a day - controls her pain Would like to avoid surgery Medication prescribed by Dr Nelva Bush

## 2015-04-23 NOTE — Assessment & Plan Note (Signed)
Check a1c 

## 2015-04-25 ENCOUNTER — Encounter: Payer: Self-pay | Admitting: Internal Medicine

## 2015-08-10 ENCOUNTER — Other Ambulatory Visit: Payer: Self-pay | Admitting: Internal Medicine

## 2015-09-03 ENCOUNTER — Ambulatory Visit (INDEPENDENT_AMBULATORY_CARE_PROVIDER_SITE_OTHER): Payer: 59 | Admitting: Family

## 2015-09-03 ENCOUNTER — Encounter: Payer: Self-pay | Admitting: Family

## 2015-09-03 VITALS — BP 138/88 | HR 74 | Temp 98.1°F | Resp 16 | Ht 64.75 in | Wt 217.8 lb

## 2015-09-03 DIAGNOSIS — J4530 Mild persistent asthma, uncomplicated: Secondary | ICD-10-CM

## 2015-09-03 DIAGNOSIS — Z8679 Personal history of other diseases of the circulatory system: Secondary | ICD-10-CM | POA: Diagnosis not present

## 2015-09-03 DIAGNOSIS — F411 Generalized anxiety disorder: Secondary | ICD-10-CM | POA: Diagnosis not present

## 2015-09-03 NOTE — Assessment & Plan Note (Signed)
Asthma classification appears mild persistent and well maintained with current medication regimen and use primarily when having flares during allergy seasons in the fall and spring. Continue current dosage of albuterol and Dulera.

## 2015-09-03 NOTE — Progress Notes (Signed)
Subjective:    Patient ID: Amanda [name], female    DOB: July 04, 1965, 50 y.o.   MRN: [id number]  Chief Complaint  Patient presents with  . Medication Refill    staying in [place] wanted to have a visit to make sure she can get refills     HPI:  Amanda [name] is a 50 y.o. female who  has a past medical history of Allergy; Anxiety; Asthma; GERD (gastroesophageal reflux disease); Hemorrhoids; colonic polyps (2010); Hyperglycemia; Hyperlipidemia; and Tachycardia, paroxysmal ([place]). and presents today for a follow up office visit.  1.) Anxiety - Currently managed with Celexa and clonazepam. Reports taking medication as prescribed and denies adverse side effects. Notes her symptoms radically controlled with current medication regimen. The clonazepam is rarely used and lasts for quite a while.   2.) Asthma - currently maintained on albuterol and Dulera. Reports taking the medication as prescribed and denies adverse side effects. Amanda [name] is a seasonal medication as when her allergies are flared up.   3.) Arrythmias - Currently maintained on atenolol for fluttering of her heart. Reports taking the medication as prescribed and denies adverse side effects. Her symptoms are generally well controlled with the current dosage.    No Known Allergies   Current Outpatient Prescriptions on File Prior to Visit  Medication Sig Dispense Refill  . albuterol (VENTOLIN HFA) 108 (90 Base) MCG/ACT inhaler INHALE TWO PUFFS BY MOUTH EVERY 4 HOURS AS NEEDED 18 g 5  . atenolol (TENORMIN) 25 MG tablet TAKE 1 TABLET(25 MG) BY MOUTH TWICE DAILY 180 tablet 2  . citalopram (CELEXA) 20 MG tablet Take 1 tablet (20 mg total) by mouth daily. 90 tablet 1  . clonazePAM (KLONOPIN) 0.5 MG tablet TAKE 1/2 TO 1 TABLET BY MOUTH EVERY 8-12 HOURS AS NEEDED 30 tablet 1  . famotidine (PEPCID) 20 MG tablet Take 1 tablet (20 mg total) by mouth daily as needed for heartburn or indigestion.    Marland Kitchen HYDROcodone-acetaminophen (NORCO) 10-325 MG per  tablet Take 1 tablet by mouth. 1 by mouth every 4-6 hours as needed    . mometasone-formoterol (DULERA) 200-5 MCG/ACT AERO Inhale 2 puffs into the lungs 2 (two) times daily. 13 g 5   No current facility-administered medications on file prior to visit.     Past Medical History:  Diagnosis Date  . Allergy    rhinitis, seasonal  . Anxiety   . Asthma    extrinsic  . GERD (gastroesophageal reflux disease)   . Hemorrhoids   . Hx of colonic polyps 2010   hyperplastic  . Hyperglycemia    borderline  . Hyperlipidemia    borderline  . Tachycardia, paroxysmal ([place])    Dr Amanda [name]     Past Surgical History:  Procedure Laterality Date  . ABDOMINAL HYSTERECTOMY  2004   For Abnormal Pap & painful menses, Dr Amanda [name]  . COLONOSCOPY W/ POLYPECTOMY  11/2008   Dr Amanda [name]  . ESI     X 2; Dr Amanda [name]  . UPPER GI ENDOSCOPY  04/2012   esophageal erosions ; Dr Amanda [name]  . WISDOM TOOTH EXTRACTION  02/13/2009    Review of Systems  Constitutional: Negative for chills and fever.  Respiratory: Negative for chest tightness and shortness of breath.   Cardiovascular: Negative for chest pain, palpitations and leg swelling.      Objective:    BP 138/88 (BP Location: Left Arm, Patient Position: Sitting, Cuff Size: Normal)   Pulse 74   Temp 98.1 F (36.7 C) (  Oral)   Resp 16   Ht 5' 4.75" (1.645 m)   Wt 217 lb 12.8 oz (98.8 kg)   SpO2 97%   BMI 36.52 kg/m  Nursing note and vital signs reviewed.  Physical Exam  Constitutional: She is oriented to person, place, and time. She appears well-developed and well-nourished. No distress.  Cardiovascular: Normal rate, regular rhythm, normal heart sounds and intact distal pulses.   Pulmonary/Chest: Effort normal and breath sounds normal.  Neurological: She is alert and oriented to person, place, and time.  Skin: Skin is warm and dry.  Psychiatric: She has a normal mood and affect. Her behavior is normal. Judgment and thought content normal.         Assessment & Plan:   Problem List Items Addressed This Visit      Respiratory   Asthma    Asthma classification appears mild persistent and well maintained with current medication regimen and use primarily when having flares during allergy seasons in the fall and spring. Continue current dosage of albuterol and Dulera.         Other   H/O ventricular tachycardia    History of ventricular tachycardia and currently maintained on atenolol with no adverse side effects or heart palpitations noted. Continue current dosage of atenolol.      Anxiety state - Primary    Anxiety well-controlled with current medication regimen with no adverse side effects. Continue current dosage of Celexa and clonazepam. [place] controlled substance database reviewed with no irregularities.       Other Visit Diagnoses   None.     I am having Amanda [name] maintain her HYDROcodone-acetaminophen, albuterol, citalopram, clonazePAM, mometasone-formoterol, famotidine, and atenolol.   Follow-up: Return if symptoms worsen or fail to improve.  Mauricio Po, FNP

## 2015-09-03 NOTE — Assessment & Plan Note (Signed)
History of ventricular tachycardia and currently maintained on atenolol with no adverse side effects or heart palpitations noted. Continue current dosage of atenolol.

## 2015-09-03 NOTE — Patient Instructions (Signed)
Thank you for choosing [facility].  Summary/Instructions:  Please continue to take your medication as prescribed.  Please let us know when you are in needed of refills.   If your symptoms worsen or fail to improve, please contact our office for further instruction, or in case of emergency go directly to the emergency room at the closest medical facility.

## 2015-09-03 NOTE — Assessment & Plan Note (Signed)
Anxiety well-controlled with current medication regimen with no adverse side effects. Continue current dosage of Celexa and clonazepam. [place] controlled substance database reviewed with no irregularities.

## 2015-09-19 ENCOUNTER — Other Ambulatory Visit: Payer: Self-pay | Admitting: *Deleted

## 2015-09-19 MED ORDER — ATENOLOL 25 MG PO TABS: ORAL_TABLET | ORAL | 1 refills | Status: DC

## 2015-09-19 NOTE — Telephone Encounter (Signed)
Rec'd call from pharmacist stating pt is needing refill on her Atenolol. She normally gets it from [facility] but they are out. They have med, but need rx call to them @ [phone]. Sent rx electronically....Johny Chess

## 2015-09-26 ENCOUNTER — Encounter: Payer: 59 | Admitting: Internal Medicine

## 2015-10-22 ENCOUNTER — Other Ambulatory Visit: Payer: Self-pay | Admitting: Internal Medicine

## 2015-10-24 ENCOUNTER — Encounter: Payer: Self-pay | Admitting: Internal Medicine

## 2015-11-23 ENCOUNTER — Telehealth: Payer: Self-pay | Admitting: Emergency Medicine

## 2015-11-23 ENCOUNTER — Telehealth: Payer: Self-pay

## 2015-11-23 MED ORDER — BUDESONIDE-FORMOTEROL FUMARATE 80-4.5 MCG/ACT IN AERO: 2.0000 | INHALATION_SPRAY | Freq: Two times a day (BID) | RESPIRATORY_TRACT | 3 refills | Status: DC

## 2015-11-23 NOTE — Telephone Encounter (Signed)
symbicort sent to pharmacy - if this does not work have her call

## 2015-11-23 NOTE — Telephone Encounter (Signed)
error 

## 2015-11-23 NOTE — Telephone Encounter (Signed)
Pharmacy called stating that Ruthe Mannan is not covered under pt's plan. Covered alternatives are Symbicort, Advair or Breo. Please advise, thanks!

## 2016-03-04 ENCOUNTER — Other Ambulatory Visit: Payer: Self-pay | Admitting: Internal Medicine

## 2016-03-04 DIAGNOSIS — F411 Generalized anxiety disorder: Secondary | ICD-10-CM

## 2016-03-04 NOTE — Telephone Encounter (Signed)
RX faxed to [facility] 

## 2016-03-29 ENCOUNTER — Other Ambulatory Visit: Payer: Self-pay | Admitting: Internal Medicine

## 2016-05-10 ENCOUNTER — Other Ambulatory Visit: Payer: Self-pay | Admitting: Internal Medicine

## 2016-05-12 NOTE — Telephone Encounter (Signed)
Ok to fill for 30 days - needs appt.  

## 2016-05-12 NOTE — Telephone Encounter (Signed)
Patient's last OV with dr burns was April/2017--routing to dr burns, are you ok with refilling med, please advise, thanks

## 2016-05-14 ENCOUNTER — Other Ambulatory Visit: Payer: Self-pay | Admitting: Internal Medicine

## 2016-05-14 ENCOUNTER — Other Ambulatory Visit: Payer: Self-pay | Admitting: Emergency Medicine

## 2016-05-14 MED ORDER — ATENOLOL 25 MG PO TABS: ORAL_TABLET | ORAL | 0 refills | Status: DC

## 2016-05-18 ENCOUNTER — Other Ambulatory Visit: Payer: Self-pay | Admitting: Internal Medicine

## 2016-05-28 ENCOUNTER — Other Ambulatory Visit: Payer: Self-pay | Admitting: Internal Medicine

## 2016-05-28 DIAGNOSIS — F411 Generalized anxiety disorder: Secondary | ICD-10-CM

## 2016-05-28 NOTE — Telephone Encounter (Signed)
Pt has an appt on 06/02/16, okay to refill?

## 2016-05-31 NOTE — Progress Notes (Signed)
Subjective:    Patient ID: Amanda [name], female    DOB: 11-12-1965, 51 y.o.   MRN: [id number]  HPI The patient is here for follow up.  She is moving back from [place] and is very happy.  She was up there for work.    GERD:  She is taking her medication daily as prescribed.  She denies any GERD symptoms and feels her GERD is well controlled.   Hyperglycemia:  She has not been exercising regularly - trying to get better.  She does not eat too much.  She Is very sedentary during the day.    Anxiety: She is taking her celexa daily as prescribed. She denies any side effects from the medication. She feels her anxiety is well controlled and she is happy with her current dose of medication.  She has used the clonazepam a little more often recently due to the stress with moving.  The clonazepam works well and she only uses half of a pill as needed  Asthma:  She is taking symbicort, but not daily.  She uses the ventolin as needed.  She feels her asthma is under controlled.  If she swallows the wrong way she will get an asthma attack -  She swallowed the wrong way and thinks some food went into her airway.  She denies daily cough, wheeze or shortness of breath.    Medications and allergies reviewed with patient and updated if appropriate.  Patient Active Problem List   Diagnosis Date Noted  . Anxiety state 05/26/2014  . DDD (degenerative disc disease), cervical 12/16/2012  . GERD (gastroesophageal reflux disease) 11/08/2012  . H/O ventricular tachycardia 11/08/2012  . RBBB 03/29/2012  . HERPES ZOSTER 02/11/2010  . COLONIC POLYPS, HX OF 02/04/2010  . OVARIAN CYST, LEFT 11/17/2008  . Mixed hyperlipidemia 09/27/2008  . ALLERGIC RHINITIS 09/27/2008  . Asthma 09/27/2008  . Hyperglycemia 09/27/2008    Current Outpatient Prescriptions on File Prior to Visit  Medication Sig Dispense Refill  . atenolol (TENORMIN) 25 MG tablet TAKE 1 TABLET(25 MG) BY MOUTH TWICE DAILY  -- Office visit needed for  further refills 60 tablet 0  . budesonide-formoterol (SYMBICORT) 80-4.5 MCG/ACT inhaler Inhale 2 puffs into the lungs 2 (two) times daily. 1 Inhaler 3  . citalopram (CELEXA) 20 MG tablet Take 1 tablet (20 mg total) by mouth daily. --patient needs to schedule office visit before any further refills 30 tablet 0  . clonazePAM (KLONOPIN) 0.5 MG tablet TAKE 1 TO 1 AND 1/2 TABLETS BY MOUTH EVERY 8 TO 12 HOURS AS NEEDED 30 tablet 0  . famotidine (PEPCID) 20 MG tablet Take 1 tablet (20 mg total) by mouth daily as needed for heartburn or indigestion.    Marland Kitchen HYDROcodone-acetaminophen (NORCO) 10-325 MG per tablet Take 1 tablet by mouth. 1 by mouth every 4-6 hours as needed    . VENTOLIN HFA 108 (90 Base) MCG/ACT inhaler INHALE 2 PUFFS BY MOUTH EVERY 4 HOURS AS NEEDED 18 g 0   No current facility-administered medications on file prior to visit.     Past Medical History:  Diagnosis Date  . Allergy    rhinitis, seasonal  . Anxiety   . Asthma    extrinsic  . GERD (gastroesophageal reflux disease)   . Hemorrhoids   . Hx of colonic polyps 2010   hyperplastic  . Hyperglycemia    borderline  . Hyperlipidemia    borderline  . Tachycardia, paroxysmal ([place])    Dr Caryl Comes  Past Surgical History:  Procedure Laterality Date  . ABDOMINAL HYSTERECTOMY  2004   For Abnormal Pap & painful menses, Dr Deatra Ina  . COLONOSCOPY W/ POLYPECTOMY  11/2008   Dr Oletta Lamas  . ESI     X 2; Dr Nelva Bush  . UPPER GI ENDOSCOPY  04/2012   esophageal erosions ; Dr Oletta Lamas  . WISDOM TOOTH EXTRACTION  02/13/2009    Social History   Social History  . Marital status: Married    Spouse name: N/A  . Number of children: N/A  . Years of education: N/A   Social History Main Topics  . Smoking status: Former Smoker    Types: Cigarettes    Quit date: 12/25/2011  . Smokeless tobacco: Not on file     Comment:  smoked 1982-2013,less than 1 pack a day   . Alcohol use Yes     Comment: Rarely  . Drug use: No  . Sexual activity: Yes      Birth control/ protection: None   Other Topics Concern  . Not on file   Social History Narrative   Regular exercise- no    Family History  Problem Relation Age of Onset  . Colon cancer Father        Stage 4  . Diabetes Father   . Colon cancer Paternal Uncle   . Lung cancer Paternal Grandfather   . Colon cancer Paternal Grandfather   . Coronary artery disease Mother        angioplasty in late 66s  . Hyperlipidemia Mother   . Breast cancer Other        MGAunts  . Depression Brother   . Bipolar disorder Brother   . Diabetes Brother   . Stroke Neg Hx     Review of Systems  Constitutional: Negative for chills and fever.  Respiratory: Negative for cough, shortness of breath and wheezing.   Cardiovascular: Negative for chest pain, palpitations and leg swelling.  Neurological: Negative for light-headedness and headaches.       Objective:   Vitals:   06/02/16 1558  BP: 136/80  Pulse: 80  Resp: 16  Temp: 97.8 F (36.6 C)   Wt Readings from Last 3 Encounters:  06/02/16 215 lb (97.5 kg)  09/03/15 217 lb 12.8 oz (98.8 kg)  04/23/15 216 lb (98 kg)   Body mass index is 36.05 kg/m.   Physical Exam    Constitutional: Appears well-developed and well-nourished. No distress.  HENT:  Head: Normocephalic and atraumatic.  Neck: Neck supple. No tracheal deviation present. No thyromegaly present.  No cervical lymphadenopathy Cardiovascular: Normal rate, regular rhythm and normal heart sounds.   No murmur heard. No carotid bruit .  No edema Pulmonary/Chest: Effort normal and breath sounds normal. No respiratory distress. No has no wheezes. No rales.  Skin: Skin is warm and dry. Not diaphoretic.  Psychiatric: Normal mood and affect. Behavior is normal.      Assessment & Plan:    See Problem List for Assessment and Plan of chronic medical problems.

## 2016-06-02 ENCOUNTER — Ambulatory Visit (INDEPENDENT_AMBULATORY_CARE_PROVIDER_SITE_OTHER): Payer: 59 | Admitting: Internal Medicine

## 2016-06-02 ENCOUNTER — Encounter: Payer: Self-pay | Admitting: Internal Medicine

## 2016-06-02 VITALS — BP 136/80 | HR 80 | Temp 97.8°F | Resp 16 | Wt 215.0 lb

## 2016-06-02 DIAGNOSIS — F411 Generalized anxiety disorder: Secondary | ICD-10-CM | POA: Diagnosis not present

## 2016-06-02 DIAGNOSIS — J4520 Mild intermittent asthma, uncomplicated: Secondary | ICD-10-CM

## 2016-06-02 DIAGNOSIS — R739 Hyperglycemia, unspecified: Secondary | ICD-10-CM

## 2016-06-02 DIAGNOSIS — K219 Gastro-esophageal reflux disease without esophagitis: Secondary | ICD-10-CM

## 2016-06-02 NOTE — Assessment & Plan Note (Signed)
Overall controlled Continue Symbicort daily during allergy season if needed-advised her that if she is using the Ventolin 2 or more times a week she definitely needs the Symbicort

## 2016-06-02 NOTE — Assessment & Plan Note (Signed)
Ge. Anxiety disorder  Celexa is  effective-continue current dose Continue cloazepam as need

## 2016-06-02 NOTE — Assessment & Plan Note (Signed)
GERD controlled Continue daily medication  

## 2016-06-02 NOTE — Patient Instructions (Addendum)
Call [place] [facility] for your mammogram  No immunizations administered today.   Medications reviewed and updated.  No changes recommended at this time.   Please followup in 6 months for a physical

## 2016-06-02 NOTE — Assessment & Plan Note (Signed)
Will check A1c in 6 months with her physical Stressed regular exercise, healthy diet and weight loss

## 2016-06-08 ENCOUNTER — Other Ambulatory Visit: Payer: Self-pay | Admitting: Internal Medicine

## 2016-08-05 ENCOUNTER — Other Ambulatory Visit: Payer: Self-pay | Admitting: Internal Medicine

## 2016-09-17 ENCOUNTER — Telehealth: Payer: Self-pay | Admitting: Internal Medicine

## 2016-09-17 ENCOUNTER — Other Ambulatory Visit: Payer: Self-pay | Admitting: Internal Medicine

## 2016-09-17 DIAGNOSIS — F411 Generalized anxiety disorder: Secondary | ICD-10-CM

## 2016-09-17 MED ORDER — ATENOLOL 50 MG PO TABS: 50.0000 mg | ORAL_TABLET | Freq: Every day | ORAL | 1 refills | Status: DC

## 2016-09-17 NOTE — Telephone Encounter (Signed)
Patient states that her script for atenolol was changed from two tablets to one tablet.  Patient states she did not realize this on her script change and has been taking two tablets.  Patient states she has gone back to trying to take one tablet 25mg  and states she has a flutter in her heart when she does and believes she needs the script written for two 25mg  tablets a day.  Patient states she will be out of medication tomorrow.  Patient uses [facility] [address].

## 2016-09-17 NOTE — Telephone Encounter (Signed)
Notified pt on refill.

## 2016-09-17 NOTE — Telephone Encounter (Signed)
RX faxed to [facility] 

## 2016-09-17 NOTE — Telephone Encounter (Signed)
Last filled on 05/29/16. Please advise.

## 2016-09-17 NOTE — Telephone Encounter (Signed)
Atenolol 50 mg tab sent to [facility]

## 2016-10-30 LAB — HEMOGLOBIN A1C: HEMOGLOBIN A1C: 5.5

## 2016-11-13 ENCOUNTER — Other Ambulatory Visit: Payer: Self-pay | Admitting: Internal Medicine

## 2016-11-17 ENCOUNTER — Other Ambulatory Visit: Payer: Self-pay | Admitting: Internal Medicine

## 2016-12-02 NOTE — Progress Notes (Signed)
Subjective:    Patient ID: MERIA CRILLY, female    DOB: 31-Mar-1965, 51 y.o.   MRN: [id number]  HPI She is here for a physical exam.   She is having reflux nightly at 11pm.  She takes pepcid as needed - she will take 20 mg of pepcid when the GERD starts and it helps.  She generally does not eat after 7pm.  She has not tried taking the medication earlier in the evening.     Medications and allergies reviewed with patient and updated if appropriate.  Patient Active Problem List   Diagnosis Date Noted  . Prediabetes 12/03/2016  . Anxiety state 05/26/2014  . DDD (degenerative disc disease), cervical 12/16/2012  . GERD (gastroesophageal reflux disease) 11/08/2012  . H/O ventricular tachycardia 11/08/2012  . RBBB 03/29/2012  . HERPES ZOSTER 02/11/2010  . COLONIC POLYPS, HX OF 02/04/2010  . OVARIAN CYST, LEFT 11/17/2008  . Mixed hyperlipidemia 09/27/2008  . ALLERGIC RHINITIS 09/27/2008  . Asthma 09/27/2008    Current Outpatient Medications on File Prior to Visit  Medication Sig Dispense Refill  . atenolol (TENORMIN) 50 MG tablet Take 1 tablet (50 mg total) by mouth daily. 90 tablet 1  . citalopram (CELEXA) 20 MG tablet TAKE 1 TABLET(20 MG) BY MOUTH DAILY 90 tablet 1  . clonazePAM (KLONOPIN) 0.5 MG tablet TAKE 1 TO 1 1/2 TABLETS BY MOUTH EVERY 8 TO 12 HOURS AS NEEDED 30 tablet 0  . famotidine (PEPCID) 20 MG tablet Take 1 tablet (20 mg total) by mouth daily as needed for heartburn or indigestion.    Marland Kitchen HYDROcodone-acetaminophen (NORCO) 10-325 MG per tablet Take 1 tablet by mouth. 1 by mouth every 4-6 hours as needed    . SYMBICORT 80-4.5 MCG/ACT inhaler INHALE 2 PUFFS INTO THE LUNGS TWICE DAILY 10.2 g 0  . VENTOLIN HFA 108 (90 Base) MCG/ACT inhaler INHALE 2 PUFFS BY MOUTH EVERY 4 HOURS AS NEEDED 18 g 1   No current facility-administered medications on file prior to visit.     Past Medical History:  Diagnosis Date  . Allergy    rhinitis, seasonal  . Anxiety   . Asthma    extrinsic  . GERD (gastroesophageal reflux disease)   . Hemorrhoids   . Hx of colonic polyps 2010   hyperplastic  . Hyperglycemia    borderline  . Hyperlipidemia    borderline  . Tachycardia, paroxysmal ([place])    Dr Caryl Comes    Past Surgical History:  Procedure Laterality Date  . ABDOMINAL HYSTERECTOMY  2004   For Abnormal Pap & painful menses, Dr Deatra Ina  . COLONOSCOPY W/ POLYPECTOMY  11/2008   Dr Oletta Lamas  . ESI     X 2; Dr Nelva Bush  . UPPER GI ENDOSCOPY  04/2012   esophageal erosions ; Dr Oletta Lamas  . WISDOM TOOTH EXTRACTION  02/13/2009    Social History   Socioeconomic History  . Marital status: Married    Spouse name: None  . Number of children: None  . Years of education: None  . Highest education level: None  Social Needs  . Financial resource strain: None  . Food insecurity - worry: None  . Food insecurity - inability: None  . Transportation needs - medical: None  . Transportation needs - non-medical: None  Occupational History  . None  Tobacco Use  . Smoking status: Former Smoker    Types: Cigarettes    Last attempt to quit: 12/25/2011    Years since quitting: 4.9  .  Smokeless tobacco: Never Used  . Tobacco comment:  smoked 1982-2013,less than 1 pack a day   Substance and Sexual Activity  . Alcohol use: Yes    Comment: Rarely  . Drug use: No  . Sexual activity: Yes    Birth control/protection: None  Other Topics Concern  . None  Social History Narrative   Regular exercise- no    Family History  Problem Relation Age of Onset  . Colon cancer Father        Stage 4  . Diabetes Father   . Coronary artery disease Mother        angioplasty in late 64s  . Hyperlipidemia Mother   . Depression Brother   . Bipolar disorder Brother   . Diabetes Brother   . Colon cancer Paternal Uncle   . Lung cancer Paternal Grandfather   . Colon cancer Paternal Grandfather   . Breast cancer Other        MGAunts  . Stroke Neg Hx     Review of Systems  Constitutional:  Negative for chills and fever.  Eyes: Positive for visual disturbance (blurry vision).  Respiratory: Negative for cough, shortness of breath and wheezing.   Cardiovascular: Negative for chest pain, palpitations (controllled) and leg swelling.  Gastrointestinal: Positive for constipation. Negative for abdominal pain, blood in stool, diarrhea and nausea.  Genitourinary: Negative for dysuria and hematuria.  Musculoskeletal: Positive for arthralgias (age related). Negative for back pain.  Skin: Positive for color change (aging spots). Negative for rash.  Neurological: Negative for light-headedness and headaches.  Psychiatric/Behavioral: Negative for dysphoric mood. The patient is nervous/anxious (controlled).        Objective:   Vitals:   12/03/16 0826  BP: (!) 142/82  Pulse: 74  Resp: 16  Temp: 98.6 F (37 C)  SpO2: 97%   Filed Weights   12/03/16 0826  Weight: 216 lb (98 kg)   Body mass index is 36.22 kg/m.  Wt Readings from Last 3 Encounters:  12/03/16 216 lb (98 kg)  06/02/16 215 lb (97.5 kg)  09/03/15 217 lb 12.8 oz (98.8 kg)     Physical Exam Constitutional: She appears well-developed and well-nourished. No distress.  HENT:  Head: Normocephalic and atraumatic.  Right Ear: External ear normal. Normal ear canal and TM Left Ear: External ear normal.  Normal ear canal and TM Mouth/Throat: Oropharynx is clear and moist.  Eyes: Conjunctivae and EOM are normal.  Neck: Neck supple. No tracheal deviation present. No thyromegaly present.  No carotid bruit  Cardiovascular: Normal rate, regular rhythm and normal heart sounds.   No murmur heard.  No edema. Pulmonary/Chest: Effort normal and breath sounds normal. No respiratory distress. She has no wheezes. She has no rales.  Breast: deferred  Abdominal: Soft. She exhibits no distension. There is no tenderness.  Lymphadenopathy: She has no cervical adenopathy.  Skin: Skin is warm and dry. She is not diaphoretic. normal  moles Psychiatric: She has a normal mood and affect. Her behavior is normal.        Assessment & Plan:   Physical exam: Screening blood work  ordered Immunizations   Flu vaccine done at work, discussed shingrix Colonoscopy   Up to date  - due next year Mammogram  Will schedule Gyn  S/p hysterectomy - has not been in a while Eye exams   Up to date  EKG  Last done 03/2012 Exercise  - no regular exercise Weight - advised weight loss Skin   Wants skin checked  -  few aging spots ? - normal exam Substance abuse  none  See Problem List for Assessment and Plan of chronic medical problems.

## 2016-12-02 NOTE — Patient Instructions (Addendum)
Test(s) ordered today. Your results will be released to MyChart (or called to you) after review, usually within 72hours after test completion. If any changes need to be made, you will be notified at that same time.  All other Health Maintenance issues reviewed.   All recommended immunizations and age-appropriate screenings are up-to-date or discussed.  No immunizations administered today.   Medications reviewed and updated.  No changes recommended at this time.  Your prescription(s) have been submitted to your pharmacy. Please take as directed and contact our office if you believe you are having problem(s) with the medication(s).  Please followup in 6 months   Health Maintenance, Female Adopting a healthy lifestyle and getting preventive care can go a long way to promote health and wellness. Talk with your health care provider about what schedule of regular examinations is right for you. This is a good chance for you to check in with your provider about disease prevention and staying healthy. In between checkups, there are plenty of things you can do on your own. Experts have done a lot of research about which lifestyle changes and preventive measures are most likely to keep you healthy. Ask your health care provider for more information. Weight and diet Eat a healthy diet  Be sure to include plenty of vegetables, fruits, low-fat dairy products, and lean protein.  Do not eat a lot of foods high in solid fats, added sugars, or salt.  Get regular exercise. This is one of the most important things you can do for your health. ? Most adults should exercise for at least 150 minutes each week. The exercise should increase your heart rate and make you sweat (moderate-intensity exercise). ? Most adults should also do strengthening exercises at least twice a week. This is in addition to the moderate-intensity exercise.  Maintain a healthy weight  Body mass index (BMI) is a measurement that can  be used to identify possible weight problems. It estimates body fat based on height and weight. Your health care provider can help determine your BMI and help you achieve or maintain a healthy weight.  For females 20 years of age and older: ? A BMI below 18.5 is considered underweight. ? A BMI of 18.5 to 24.9 is normal. ? A BMI of 25 to 29.9 is considered overweight. ? A BMI of 30 and above is considered obese.  Watch levels of cholesterol and blood lipids  You should start having your blood tested for lipids and cholesterol at 52 years of age, then have this test every 5 years.  You may need to have your cholesterol levels checked more often if: ? Your lipid or cholesterol levels are high. ? You are older than 52 years of age. ? You are at high risk for heart disease.  Cancer screening Lung Cancer  Lung cancer screening is recommended for adults 55-80 years old who are at high risk for lung cancer because of a history of smoking.  A yearly low-dose CT scan of the lungs is recommended for people who: ? Currently smoke. ? Have quit within the past 15 years. ? Have at least a 30-pack-year history of smoking. A pack year is smoking an average of one pack of cigarettes a day for 1 year.  Yearly screening should continue until it has been 15 years since you quit.  Yearly screening should stop if you develop a health problem that would prevent you from having lung cancer treatment.  Breast Cancer  Practice breast self-awareness. This   means understanding how your breasts normally appear and feel.  It also means doing regular breast self-exams. Let your health care provider know about any changes, no matter how small.  If you are in your 20s or 30s, you should have a clinical breast exam (CBE) by a health care provider every 1-3 years as part of a regular health exam.  If you are 40 or older, have a CBE every year. Also consider having a breast X-ray (mammogram) every year.  If you  have a family history of breast cancer, talk to your health care provider about genetic screening.  If you are at high risk for breast cancer, talk to your health care provider about having an MRI and a mammogram every year.  Breast cancer gene (BRCA) assessment is recommended for women who have family members with BRCA-related cancers. BRCA-related cancers include: ? Breast. ? Ovarian. ? Tubal. ? Peritoneal cancers.  Results of the assessment will determine the need for genetic counseling and BRCA1 and BRCA2 testing.  Cervical Cancer Your health care provider may recommend that you be screened regularly for cancer of the pelvic organs (ovaries, uterus, and vagina). This screening involves a pelvic examination, including checking for microscopic changes to the surface of your cervix (Pap test). You may be encouraged to have this screening done every 3 years, beginning at age 21.  For women ages 30-65, health care providers may recommend pelvic exams and Pap testing every 3 years, or they may recommend the Pap and pelvic exam, combined with testing for human papilloma virus (HPV), every 5 years. Some types of HPV increase your risk of cervical cancer. Testing for HPV may also be done on women of any age with unclear Pap test results.  Other health care providers may not recommend any screening for nonpregnant women who are considered low risk for pelvic cancer and who do not have symptoms. Ask your health care provider if a screening pelvic exam is right for you.  If you have had past treatment for cervical cancer or a condition that could lead to cancer, you need Pap tests and screening for cancer for at least 20 years after your treatment. If Pap tests have been discontinued, your risk factors (such as having a new sexual partner) need to be reassessed to determine if screening should resume. Some women have medical problems that increase the chance of getting cervical cancer. In these cases,  your health care provider may recommend more frequent screening and Pap tests.  Colorectal Cancer  This type of cancer can be detected and often prevented.  Routine colorectal cancer screening usually begins at 52 years of age and continues through 52 years of age.  Your health care provider may recommend screening at an earlier age if you have risk factors for colon cancer.  Your health care provider may also recommend using home test kits to check for hidden blood in the stool.  A small camera at the end of a tube can be used to examine your colon directly (sigmoidoscopy or colonoscopy). This is done to check for the earliest forms of colorectal cancer.  Routine screening usually begins at age 50.  Direct examination of the colon should be repeated every 5-10 years through 52 years of age. However, you may need to be screened more often if early forms of precancerous polyps or small growths are found.  Skin Cancer  Check your skin from head to toe regularly.  Tell your health care provider about any new   moles or changes in moles, especially if there is a change in a mole's shape or color.  Also tell your health care provider if you have a mole that is larger than the size of a pencil eraser.  Always use sunscreen. Apply sunscreen liberally and repeatedly throughout the day.  Protect yourself by wearing long sleeves, pants, a wide-brimmed hat, and sunglasses whenever you are outside.  Heart disease, diabetes, and high blood pressure  High blood pressure causes heart disease and increases the risk of stroke. High blood pressure is more likely to develop in: ? People who have blood pressure in the high end of the normal range (130-139/85-89 mm Hg). ? People who are overweight or obese. ? People who are African American.  If you are 18-39 years of age, have your blood pressure checked every 3-5 years. If you are 40 years of age or older, have your blood pressure checked every year.  You should have your blood pressure measured twice-once when you are at a hospital or clinic, and once when you are not at a hospital or clinic. Record the average of the two measurements. To check your blood pressure when you are not at a hospital or clinic, you can use: ? An automated blood pressure machine at a pharmacy. ? A home blood pressure monitor.  If you are between 55 years and 79 years old, ask your health care provider if you should take aspirin to prevent strokes.  Have regular diabetes screenings. This involves taking a blood sample to check your fasting blood sugar level. ? If you are at a normal weight and have a low risk for diabetes, have this test once every three years after 52 years of age. ? If you are overweight and have a high risk for diabetes, consider being tested at a younger age or more often. Preventing infection Hepatitis B  If you have a higher risk for hepatitis B, you should be screened for this virus. You are considered at high risk for hepatitis B if: ? You were born in a country where hepatitis B is common. Ask your health care provider which countries are considered high risk. ? Your parents were born in a high-risk country, and you have not been immunized against hepatitis B (hepatitis B vaccine). ? You have HIV or AIDS. ? You use needles to inject street drugs. ? You live with someone who has hepatitis B. ? You have had sex with someone who has hepatitis B. ? You get hemodialysis treatment. ? You take certain medicines for conditions, including cancer, organ transplantation, and autoimmune conditions.  Hepatitis C  Blood testing is recommended for: ? Everyone born from 1945 through 1965. ? Anyone with known risk factors for hepatitis C.  Sexually transmitted infections (STIs)  You should be screened for sexually transmitted infections (STIs) including gonorrhea and chlamydia if: ? You are sexually active and are younger than 52 years of  age. ? You are older than 52 years of age and your health care provider tells you that you are at risk for this type of infection. ? Your sexual activity has changed since you were last screened and you are at an increased risk for chlamydia or gonorrhea. Ask your health care provider if you are at risk.  If you do not have HIV, but are at risk, it may be recommended that you take a prescription medicine daily to prevent HIV infection. This is called pre-exposure prophylaxis (PrEP). You are considered at risk   if: ? You are sexually active and do not regularly use condoms or know the HIV status of your partner(s). ? You take drugs by injection. ? You are sexually active with a partner who has HIV.  Talk with your health care provider about whether you are at high risk of being infected with HIV. If you choose to begin PrEP, you should first be tested for HIV. You should then be tested every 3 months for as long as you are taking PrEP. Pregnancy  If you are premenopausal and you may become pregnant, ask your health care provider about preconception counseling.  If you may become pregnant, take 400 to 800 micrograms (mcg) of folic acid every day.  If you want to prevent pregnancy, talk to your health care provider about birth control (contraception). Osteoporosis and menopause  Osteoporosis is a disease in which the bones lose minerals and strength with aging. This can result in serious bone fractures. Your risk for osteoporosis can be identified using a bone density scan.  If you are 75 years of age or older, or if you are at risk for osteoporosis and fractures, ask your health care provider if you should be screened.  Ask your health care provider whether you should take a calcium or vitamin D supplement to lower your risk for osteoporosis.  Menopause may have certain physical symptoms and risks.  Hormone replacement therapy may reduce some of these symptoms and risks. Talk to your health  care provider about whether hormone replacement therapy is right for you. Follow these instructions at home:  Schedule regular health, dental, and eye exams.  Stay current with your immunizations.  Do not use any tobacco products including cigarettes, chewing tobacco, or electronic cigarettes.  If you are pregnant, do not drink alcohol.  If you are breastfeeding, limit how much and how often you drink alcohol.  Limit alcohol intake to no more than 1 drink per day for nonpregnant women. One drink equals 12 ounces of beer, 5 ounces of wine, or 1 ounces of hard liquor.  Do not use street drugs.  Do not share needles.  Ask your health care provider for help if you need support or information about quitting drugs.  Tell your health care provider if you often feel depressed.  Tell your health care provider if you have ever been abused or do not feel safe at home. This information is not intended to replace advice given to you by your health care provider. Make sure you discuss any questions you have with your health care provider. Document Released: 07/22/2010 Document Revised: 06/14/2015 Document Reviewed: 10/10/2014 Elsevier Interactive Patient Education  [employer].

## 2016-12-03 ENCOUNTER — Encounter: Payer: Self-pay | Admitting: Internal Medicine

## 2016-12-03 ENCOUNTER — Other Ambulatory Visit (INDEPENDENT_AMBULATORY_CARE_PROVIDER_SITE_OTHER): Payer: 59

## 2016-12-03 ENCOUNTER — Ambulatory Visit (INDEPENDENT_AMBULATORY_CARE_PROVIDER_SITE_OTHER): Payer: 59 | Admitting: Internal Medicine

## 2016-12-03 VITALS — BP 142/82 | HR 74 | Temp 98.6°F | Resp 16 | Wt 216.0 lb

## 2016-12-03 DIAGNOSIS — Z8679 Personal history of other diseases of the circulatory system: Secondary | ICD-10-CM | POA: Diagnosis not present

## 2016-12-03 DIAGNOSIS — R7303 Prediabetes: Secondary | ICD-10-CM

## 2016-12-03 DIAGNOSIS — E782 Mixed hyperlipidemia: Secondary | ICD-10-CM

## 2016-12-03 DIAGNOSIS — F411 Generalized anxiety disorder: Secondary | ICD-10-CM

## 2016-12-03 DIAGNOSIS — Z0000 Encounter for general adult medical examination without abnormal findings: Secondary | ICD-10-CM | POA: Diagnosis not present

## 2016-12-03 DIAGNOSIS — J4520 Mild intermittent asthma, uncomplicated: Secondary | ICD-10-CM

## 2016-12-03 DIAGNOSIS — E119 Type 2 diabetes mellitus without complications: Secondary | ICD-10-CM | POA: Insufficient documentation

## 2016-12-03 DIAGNOSIS — K219 Gastro-esophageal reflux disease without esophagitis: Secondary | ICD-10-CM

## 2016-12-03 LAB — CBC WITH DIFFERENTIAL/PLATELET
BASOS ABS: 0.1 10*3/uL (ref 0.0–0.1)
BASOS PCT: 0.8 % (ref 0.0–3.0)
Eosinophils Absolute: 0.2 10*3/uL (ref 0.0–0.7)
Eosinophils Relative: 2.8 % (ref 0.0–5.0)
HEMATOCRIT: 42.3 % (ref 36.0–46.0)
Hemoglobin: 14.1 g/dL (ref 12.0–15.0)
LYMPHS ABS: 1.8 10*3/uL (ref 0.7–4.0)
Lymphocytes Relative: 21.2 % (ref 12.0–46.0)
MCHC: 33.5 g/dL (ref 30.0–36.0)
MCV: 92 fl (ref 78.0–100.0)
MONOS PCT: 7.1 % (ref 3.0–12.0)
Monocytes Absolute: 0.6 10*3/uL (ref 0.1–1.0)
NEUTROS ABS: 5.7 10*3/uL (ref 1.4–7.7)
NEUTROS PCT: 68.1 % (ref 43.0–77.0)
PLATELETS: 323 10*3/uL (ref 150.0–400.0)
RBC: 4.59 Mil/uL (ref 3.87–5.11)
RDW: 13.8 % (ref 11.5–15.5)
WBC: 8.3 10*3/uL (ref 4.0–10.5)

## 2016-12-03 LAB — COMPREHENSIVE METABOLIC PANEL
ALBUMIN: 4.2 g/dL (ref 3.5–5.2)
ALK PHOS: 51 U/L (ref 39–117)
ALT: 14 U/L (ref 0–35)
AST: 18 U/L (ref 0–37)
BUN: 7 mg/dL (ref 6–23)
CALCIUM: 9.1 mg/dL (ref 8.4–10.5)
CHLORIDE: 101 meq/L (ref 96–112)
CO2: 32 mEq/L (ref 19–32)
Creatinine, Ser: 0.74 mg/dL (ref 0.40–1.20)
GFR: 87.58 mL/min (ref >60.00)
Glucose, Bld: 93 mg/dL (ref 70–99)
POTASSIUM: 4 meq/L (ref 3.5–5.1)
SODIUM: 139 meq/L (ref 135–145)
TOTAL PROTEIN: 7.2 g/dL (ref 6.0–8.3)
Total Bilirubin: 0.4 mg/dL (ref 0.2–1.2)

## 2016-12-03 LAB — LIPID PANEL
CHOLESTEROL: 234 mg/dL — AB (ref 0–200)
HDL: 43.1 mg/dL (ref >39.00)
LDL Cholesterol: 160 mg/dL — ABNORMAL HIGH (ref 0–99)
NonHDL: 191.25
TRIGLYCERIDES: 158 mg/dL — AB (ref 0.0–149.0)
Total CHOL/HDL Ratio: 5
VLDL: 31.6 mg/dL (ref 0.0–40.0)

## 2016-12-03 LAB — TSH: TSH: 0.55 u[IU]/mL (ref 0.35–4.50)

## 2016-12-03 LAB — HEMOGLOBIN A1C: Hgb A1c MFr Bld: 5.9 % (ref 4.6–6.5)

## 2016-12-03 MED ORDER — CLONAZEPAM 0.5 MG PO TABS: ORAL_TABLET | ORAL | 2 refills | Status: DC

## 2016-12-03 NOTE — Assessment & Plan Note (Signed)
Check a1c Low sugar / carb diet Stressed regular exercise, weight loss  

## 2016-12-03 NOTE — Assessment & Plan Note (Addendum)
GERD not controlled - having nightly symptoms and taking pepcid prn  Start pepcid in evening daily prior to symptoms  GERD diet, avoid eating after 7pm

## 2016-12-03 NOTE — Assessment & Plan Note (Signed)
Controlled, stable Continue current dose of medication - celexa daily and clonazepam prn

## 2016-12-03 NOTE — Assessment & Plan Note (Signed)
Controlled Taking Symbicort daily during allergy season only - occasionally takes it twice daily Takes ventolin as needed only

## 2016-12-03 NOTE — Assessment & Plan Note (Signed)
Taking atenolol daily - controlled

## 2016-12-03 NOTE — Assessment & Plan Note (Addendum)
Check lipid panel  May need to try another statin - did not tolerate pravastatin - would like to avoid it if possible Regular exercise and healthy diet encouraged

## 2016-12-09 ENCOUNTER — Encounter: Payer: Self-pay | Admitting: Internal Medicine

## 2017-01-08 ENCOUNTER — Ambulatory Visit: Payer: Self-pay

## 2017-01-08 ENCOUNTER — Emergency Department (HOSPITAL_COMMUNITY): Payer: 59

## 2017-01-08 ENCOUNTER — Encounter (HOSPITAL_COMMUNITY): Payer: Self-pay | Admitting: Emergency Medicine

## 2017-01-08 ENCOUNTER — Emergency Department (HOSPITAL_COMMUNITY)
Admission: EM | Admit: 2017-01-08 | Discharge: 2017-01-08 | Disposition: A | Discharge status: Home / Self Care | Payer: 59 | Attending: Physician Assistant | Admitting: Physician Assistant

## 2017-01-08 ENCOUNTER — Other Ambulatory Visit: Payer: Self-pay

## 2017-01-08 DIAGNOSIS — R0789 Other chest pain: Secondary | ICD-10-CM | POA: Insufficient documentation

## 2017-01-08 DIAGNOSIS — Z79899 Other long term (current) drug therapy: Secondary | ICD-10-CM | POA: Insufficient documentation

## 2017-01-08 DIAGNOSIS — R079 Chest pain, unspecified: Secondary | ICD-10-CM | POA: Diagnosis present

## 2017-01-08 DIAGNOSIS — J45909 Unspecified asthma, uncomplicated: Secondary | ICD-10-CM | POA: Diagnosis not present

## 2017-01-08 DIAGNOSIS — R0602 Shortness of breath: Secondary | ICD-10-CM | POA: Insufficient documentation

## 2017-01-08 DIAGNOSIS — Z87891 Personal history of nicotine dependence: Secondary | ICD-10-CM | POA: Insufficient documentation

## 2017-01-08 DIAGNOSIS — E785 Hyperlipidemia, unspecified: Secondary | ICD-10-CM | POA: Diagnosis not present

## 2017-01-08 DIAGNOSIS — F419 Anxiety disorder, unspecified: Secondary | ICD-10-CM | POA: Insufficient documentation

## 2017-01-08 LAB — BASIC METABOLIC PANEL
ANION GAP: 7 (ref 5–15)
BUN: 6 mg/dL (ref 6–20)
CALCIUM: 9.2 mg/dL (ref 8.9–10.3)
CO2: 29 mmol/L (ref 22–32)
CREATININE: 0.73 mg/dL (ref 0.44–1.00)
Chloride: 104 mmol/L (ref 101–111)
GFR calc Af Amer: >60 mL/min (ref >60)
Glucose, Bld: 112 mg/dL — ABNORMAL HIGH (ref 65–99)
Potassium: 4.3 mmol/L (ref 3.5–5.1)
Sodium: 140 mmol/L (ref 135–145)

## 2017-01-08 LAB — CBC
HCT: 43 % (ref 36.0–46.0)
Hemoglobin: 14 g/dL (ref 12.0–15.0)
MCH: 30 pg (ref 26.0–34.0)
MCHC: 32.6 g/dL (ref 30.0–36.0)
MCV: 92.3 fL (ref 78.0–100.0)
PLATELETS: 293 10*3/uL (ref 150–400)
RBC: 4.66 MIL/uL (ref 3.87–5.11)
RDW: 13.4 % (ref 11.5–15.5)
WBC: 10.6 10*3/uL — ABNORMAL HIGH (ref 4.0–10.5)

## 2017-01-08 LAB — I-STAT TROPONIN, ED
TROPONIN I, POC: 0 ng/mL (ref 0.00–0.08)
Troponin i, poc: 0 ng/mL (ref 0.00–0.08)

## 2017-01-08 LAB — I-STAT BETA HCG BLOOD, ED (MC, WL, AP ONLY): I-stat hCG, quantitative: <5 m[IU]/mL (ref <5)

## 2017-01-08 NOTE — Telephone Encounter (Signed)
Pt called to report "heaviness" to her left chest that radiated to right chest that began at 0630 this am. States chest felt tight and she was experiencing nausea. States that her arms "felt heavy" and that her face felt "weird" like it was "melting". Pt denies that she is experiencing numbness, tingling or  visual difficulties or speech difficulties. Pt did say that the pain subsided but she is left with "a lump in her throat." Pt states she took 1/2 of a Klonopin then began to calm down and feel better. Denies SOB. Denies CP at the time of the call.  Advised to be driven to the nearest ED or [facility] ED ASAP.   Reason for Disposition . Pain also present in shoulder(s) or arm(s) or jaw  (Exception: pain is clearly made worse by movement)    States it feels like she has a "lump" in her throat.  Answer Assessment - Initial Assessment Questions 1. LOCATION: "Where does it hurt?"       Bilateral upper chest 2. RADIATION: "Does the pain go anywhere else?" (e.g., into neck, jaw, arms, back)     To the back 3. ONSET: "When did the chest pain begin?" (Minutes, hours or days)      0630 this am  4. PATTERN "Does the pain come and go, or has it been constant since it started?"  "Does it get worse with exertion?"      No pain anymore feels like a lump in throat  5. DURATION: "How long does it last" (e.g., seconds, minutes, hours)     Started 0630 but now feels like a lump in my throat felt like episode lasted 15 minutes 6. SEVERITY: "How bad is the pain?"  (e.g., Scale 1-10; mild, moderate, or severe)    - MILD (1-3): doesn't interfere with normal activities     - MODERATE (4-7): interferes with normal activities or awakens from sleep    - SEVERE (8-10): excruciating pain, unable to do any normal activities       6 7. CARDIAC RISK FACTORS: "Do you have any history of heart problems or risk factors for heart disease?" (e.g., prior heart attack, angina; high blood pressure, diabetes, being overweight, high  cholesterol, smoking, or strong family history of heart disease)     Family h/ heart disease former smoker overweight 8. PULMONARY RISK FACTORS: "Do you have any history of lung disease?"  (e.g., blood clots in lung, asthma, emphysema, birth control pills)    noo  9. CAUSE: "What do you think is causing the chest pain?"     Pt has no idea maybe  10. OTHER SYMPTOMS: "Do you have any other symptoms?" (e.g., dizziness, nausea, vomiting, sweating, fever, difficulty breathing, cough)       Nauseated afterwards 11. PREGNANCY: "Is there any chance you are pregnant?" "When was your last menstrual period?"       n/a  Protocols used: CHEST PAIN-A-AH

## 2017-01-08 NOTE — Telephone Encounter (Signed)
Pt has been check in to ED

## 2017-01-08 NOTE — ED Provider Notes (Signed)
[facility]ER[facility]ENC[facility] DEPARTMENT Provider Note   CSN: [id number] Arrival date & time: 01/08/17  4259     History   Chief Complaint Chief Complaint  Amanda [name] presents with  . Chest Pain    HPI Amanda [name] is a 51 y.o. female.  HPI  Amanda [name] is a 51 year old female presenting with chest pain.  Amanda [name] had an episode today where she felt heaviness in her chest across her chest and both of her arms felt very heavy.  She felt anxious.  Short of breath, sweaty.  Amanda [name] had panic attacks in the past.  She reports that it did feel similarly and that there was a flushing reaction.  Amanda [name] took some of her Klonopin.  Come felt completely resolved.  Went to work.  However she called her primary care physician and explained the symptoms to them and they told her to come here to the emergency department to be evaluated.   Amanda [name]'s past medical history significant for borderline diabetes, no hypertension no hyperlipidemia.  Amanda [name] does have heart murmur with a chronic tachycardia and controlled by atenolol.   Past Medical History:  Diagnosis Date  . Allergy    rhinitis, seasonal  . Anxiety   . Asthma    extrinsic  . GERD (gastroesophageal reflux disease)   . Hemorrhoids   . Hx of colonic polyps 2010   hyperplastic  . Hyperglycemia    borderline  . Hyperlipidemia    borderline  . Tachycardia, paroxysmal ([place])    Dr Caryl Comes    Amanda [name] Active Problem List   Diagnosis Date Noted  . Prediabetes 12/03/2016  . Anxiety state 05/26/2014  . DDD (degenerative disc disease), cervical 12/16/2012  . GERD (gastroesophageal reflux disease) 11/08/2012  . H/O ventricular tachycardia 11/08/2012  . RBBB 03/29/2012  . HERPES ZOSTER 02/11/2010  . COLONIC POLYPS, HX OF 02/04/2010  . OVARIAN CYST, LEFT 11/17/2008  . Mixed hyperlipidemia 09/27/2008  . ALLERGIC RHINITIS 09/27/2008  . Asthma 09/27/2008    Past Surgical History:  Procedure Laterality Date  . ABDOMINAL  HYSTERECTOMY  2004   For Abnormal Pap & painful menses, Dr Deatra Ina  . COLONOSCOPY W/ POLYPECTOMY  11/2008   Dr Oletta Lamas  . ESI     X 2; Dr Nelva Bush  . UPPER GI ENDOSCOPY  04/2012   esophageal erosions ; Dr Oletta Lamas  . WISDOM TOOTH EXTRACTION  02/13/2009    OB History    No data available       Home Medications    Prior to Admission medications   Medication Sig Start Date End Date Taking? Authorizing Provider  atenolol (TENORMIN) 50 MG tablet Take 1 tablet (50 mg total) by mouth daily. 09/17/16  Yes Burns, Claudina Lick, MD  citalopram (CELEXA) 20 MG tablet TAKE 1 TABLET(20 MG) BY MOUTH DAILY 08/05/16  Yes Burns, Claudina Lick, MD  famotidine (PEPCID) 20 MG tablet Take 1 tablet (20 mg total) by mouth daily as needed for heartburn or indigestion. 04/23/15  Yes Burns, Claudina Lick, MD  HYDROcodone-acetaminophen (NORCO) 10-325 MG per tablet Take 1 tablet by mouth 5 (five) times daily.    Yes [provider]  naloxone (NARCAN) nasal spray 4 mg/0.1 mL Place 1 spray into the nose once as needed (overdose).   Yes [provider]  SYMBICORT 80-4.5 MCG/ACT inhaler INHALE 2 PUFFS INTO THE LUNGS TWICE DAILY 11/13/16  Yes Burns, Claudina Lick, MD  VENTOLIN HFA 108 (90 Base) MCG/ACT inhaler INHALE 2 PUFFS BY MOUTH EVERY 4 HOURS  AS NEEDED Amanda [name] taking differently: INHALE 2 PUFFS BY MOUTH EVERY 4 HOURS AS NEEDED FOR WHEEZING 11/17/16  Yes Burns, Claudina Lick, MD  clonazePAM (KLONOPIN) 0.5 MG tablet TAKE 1 TO 1 1/2 TABLETS BY MOUTH EVERY 8 TO 12 HOURS AS NEEDED Amanda [name] taking differently: Take 0.5-0.75 mg by mouth every 8 (eight) hours as needed for anxiety.  12/03/16   Binnie Rail, MD    Family History Family History  Problem Relation Age of Onset  . Colon cancer Father        Stage 4  . Diabetes Father   . Coronary artery disease Mother        angioplasty in late 35s  . Hyperlipidemia Mother   . Depression Brother   . Bipolar disorder Brother   . Diabetes Brother   . Colon cancer Paternal Uncle   .  Lung cancer Paternal Grandfather   . Colon cancer Paternal Grandfather   . Breast cancer Other        MGAunts  . Stroke Neg Hx     Social History Social History   Tobacco Use  . Smoking status: Former Smoker    Types: Cigarettes    Last attempt to quit: 12/25/2011    Years since quitting: 5.0  . Smokeless tobacco: Never Used  . Tobacco comment:  smoked 1982-2013,less than 1 pack a day   Substance Use Topics  . Alcohol use: Yes    Comment: Rarely  . Drug use: No     Allergies   Amanda [name] has no known allergies.   Review of Systems Review of Systems  Constitutional: Negative for activity change and fatigue.  Respiratory: Positive for shortness of breath.   Cardiovascular: Positive for chest pain.  Gastrointestinal: Negative for abdominal pain.     Physical Exam Updated Vital Signs BP (!) 143/79   Pulse 67   Temp 99.1 F (37.3 C) (Oral)   Resp 12   Ht 5\' 4"  (1.626 m)   Wt 98 kg (216 lb)   SpO2 92%   BMI 37.08 kg/m   Physical Exam  Constitutional: She is oriented to person, place, and time. She appears well-developed and well-nourished.  HENT:  Head: Normocephalic and atraumatic.  Eyes: Right eye exhibits no discharge.  Cardiovascular: Normal rate and regular rhythm.  Murmur heard. Pulmonary/Chest: Effort normal and breath sounds normal. She has no wheezes. She has no rales.  Abdominal: Soft. She exhibits no distension. There is no tenderness.  Neurological: She is oriented to person, place, and time.  Skin: Skin is warm and dry. She is not diaphoretic.  Psychiatric: She has a normal mood and affect.  Nursing note and vitals reviewed.    ED Treatments / Results  Labs (all labs ordered are listed, but only abnormal results are displayed) Labs Reviewed  BASIC METABOLIC PANEL - Abnormal; Notable for the following components:      Result Value   Glucose, Bld 112 (*)    All other components within normal limits  CBC - Abnormal; Notable for the following  components:   WBC 10.6 (*)    All other components within normal limits  I-STAT TROPONIN, ED  I-STAT BETA HCG BLOOD, ED ([facility], [facility], [facility] ONLY)  I-STAT TROPONIN, ED    EKG  EKG Interpretation  Date/Time:  Thursday January 08 2017 09:51:02 EST Ventricular Rate:  71 PR Interval:  158 QRS Duration: 132 QT Interval:  464 QTC Calculation: 504 R Axis:   -48 Text Interpretation:  Normal sinus  rhythm Normal sinus rhythm Confirmed by Thomasene Lot, [place] [phone]) on 01/08/2017 11:21:37 AM       Radiology Dg Chest 2 View  Result Date: 01/08/2017 CLINICAL DATA:  Chest pain EXAM: CHEST  2 VIEW COMPARISON:  02/20/2009 FINDINGS: Mild hyperinflation. Heart and mediastinal contours are within normal limits. No focal opacities or effusions. No acute bony abnormality. IMPRESSION: No active cardiopulmonary disease. Electronically Signed   By: Rolm Baptise M.D.   On: 01/08/2017 10:15    Procedures Procedures (including critical care time)  Medications Ordered in ED Medications - No data to display   Initial Impression / Assessment and Plan / ED Course  I have reviewed the triage vital signs and the nursing notes.  Pertinent labs & imaging results that were available during my care of the Amanda [name] were reviewed by me and considered in my medical decision making (see chart for details).     Amanda [name] is a 51 year old female presenting with chest pain.  Amanda [name] had an episode today where she felt heaviness in her chest across her chest and both of her arms felt very heavy.  She felt anxious.  Short of breath, sweaty.  Amanda [name] had panic attacks in the past.  She reports that it did feel similarly and that there was a flushing reaction.  Amanda [name] took some of her Klonopin.  Come felt completely resolved.  Went to work.  However she called her primary care physician and explained the symptoms to them and they told her to come here to the emergency department to be evaluated.   Amanda [name]'s past medical history  significant for borderline diabetes, no hypertension no hyperlipidemia.  Amanda [name] does have heart murmur with a chronic tachycardia and controlled by atenolol.  3:13 PM Very well-appearing.  Heart score of 2.  Will do delta troponin.  I think is likely anxiety.  3:13 PM Dleta negative. No further CP. Will dc with PCP follow up.   Final Clinical Impressions(s) / ED Diagnoses   Final diagnoses:  Atypical chest pain  Anxiety    ED Discharge Orders    None       Macarthur Critchley, MD 01/08/17 1513

## 2017-01-08 NOTE — ED Notes (Signed)
RN ambulated patient while wearing pulse ox. Pulse ox 96% at rest on bed. Decreased to 91% at lowest while ambulating. Returned to normal at rest. No respiratory distress noted.

## 2017-01-08 NOTE — Discharge Instructions (Signed)
We are unsure what caused her chest pain.  However given all the symptoms it could have been anxiety.  We happy to report that both of your troponins (a cardiac enzymes) are negative.  Please return with any symptoms or complaints.

## 2017-01-08 NOTE — ED Triage Notes (Signed)
Pt states that while watching the news this am at 0600 she had a sudden onset of substernal chest pressure and became flushed and felt faint. Pt states she took a anti anxiety medication which seemed to help and went to work, She contacted her doctor to to her about this event which advised to have her husband drive her to the ER. Pt states at this time she feels fine with no pain.

## 2017-01-08 NOTE — ED Notes (Addendum)
Patient reports chest pain began L chest, and reports it spread to R chest and bilateral arms. Constant. Lasted approx 15 min. Denies chest pain at this time.

## 2017-01-22 DIAGNOSIS — R079 Chest pain, unspecified: Secondary | ICD-10-CM | POA: Insufficient documentation

## 2017-01-22 NOTE — Progress Notes (Signed)
Subjective:    Patient ID: Amanda [name], female    DOB: Jul 16, 1965, 52 y.o.   MRN: [id number]  HPI The patient is here for follow up from the ED.  She went to the ED 12/20 for chest pain. She had heaviness/pain/pressure across her chest.  Both of her arms felt heavy.  She felt flushed.  She felt anxious, short of breath, sweaty.  She has a history of panic attacks, but they were different than this.   She took 1/2 klonopin.  Her symptoms resolved, except for a lump in her throat and she went to work.  She called our office because she still had the lump in her throat and was advised to go to ED for evaluation.    EKG showed normal sinus rhythm without ischemic changes.  Blood was unremarkable, including troponins x 2.  CXR was normal.   Since then she has taken 1/2 of a klonopin if she feels any anxiety.   This event occurred during the holidays and there was some increased anxiety.  She has taken more clonazepam lately than in the past.  She has had some slight chest pressure/discomfort with palpitations and mild SOB and feels it is related to anxiety. The clonazepam has helped.  Ideally she would like to be off the celexa and not taking the clonazepam as often.     H/o ventricular tachycardia, mildly elevated BP: She is taking her atenolol daily - this is for her ventricular tachycardia. She is compliant with a low sodium diet.  She has had some chest pain, palpitations and SOB associated with anxiety only.  She denies any leg swelling or regular headaches.    Medications and allergies reviewed with patient and updated if appropriate.  Patient Active Problem List   Diagnosis Date Noted  . Chest pain 01/22/2017  . Prediabetes 12/03/2016  . Anxiety state 05/26/2014  . DDD (degenerative disc disease), cervical 12/16/2012  . GERD (gastroesophageal reflux disease) 11/08/2012  . H/O ventricular tachycardia 11/08/2012  . RBBB 03/29/2012  . HERPES ZOSTER 02/11/2010  . COLONIC POLYPS, HX OF  02/04/2010  . OVARIAN CYST, LEFT 11/17/2008  . Mixed hyperlipidemia 09/27/2008  . ALLERGIC RHINITIS 09/27/2008  . Asthma 09/27/2008    Current Outpatient Medications on File Prior to Visit  Medication Sig Dispense Refill  . atenolol (TENORMIN) 50 MG tablet Take 1 tablet (50 mg total) by mouth daily. 90 tablet 1  . citalopram (CELEXA) 20 MG tablet TAKE 1 TABLET(20 MG) BY MOUTH DAILY 90 tablet 1  . clonazePAM (KLONOPIN) 0.5 MG tablet TAKE 1 TO 1 1/2 TABLETS BY MOUTH EVERY 8 TO 12 HOURS AS NEEDED (Patient taking differently: Take 0.5-0.75 mg by mouth every 8 (eight) hours as needed for anxiety. ) 30 tablet 2  . famotidine (PEPCID) 20 MG tablet Take 1 tablet (20 mg total) by mouth daily as needed for heartburn or indigestion.    Marland Kitchen HYDROcodone-acetaminophen (NORCO) 10-325 MG per tablet Take 1 tablet by mouth 5 (five) times daily.     . naloxone (NARCAN) nasal spray 4 mg/0.1 mL Place 1 spray into the nose once as needed (overdose).    . SYMBICORT 80-4.5 MCG/ACT inhaler INHALE 2 PUFFS INTO THE LUNGS TWICE DAILY 10.2 g 0  . VENTOLIN HFA 108 (90 Base) MCG/ACT inhaler INHALE 2 PUFFS BY MOUTH EVERY 4 HOURS AS NEEDED (Patient taking differently: INHALE 2 PUFFS BY MOUTH EVERY 4 HOURS AS NEEDED FOR WHEEZING) 18 g 1   No current  facility-administered medications on file prior to visit.     Past Medical History:  Diagnosis Date  . Allergy    rhinitis, seasonal  . Anxiety   . Asthma    extrinsic  . GERD (gastroesophageal reflux disease)   . Hemorrhoids   . Hx of colonic polyps 2010   hyperplastic  . Hyperglycemia    borderline  . Hyperlipidemia    borderline  . Tachycardia, paroxysmal ([place])    Dr Caryl Comes    Past Surgical History:  Procedure Laterality Date  . ABDOMINAL HYSTERECTOMY  2004   For Abnormal Pap & painful menses, Dr Deatra Ina  . COLONOSCOPY W/ POLYPECTOMY  11/2008   Dr Oletta Lamas  . ESI     X 2; Dr Nelva Bush  . UPPER GI ENDOSCOPY  04/2012   esophageal erosions ; Dr Oletta Lamas  . WISDOM  TOOTH EXTRACTION  02/13/2009    Social History   Socioeconomic History  . Marital status: Married    Spouse name: None  . Number of children: None  . Years of education: None  . Highest education level: None  Social Needs  . Financial resource strain: None  . Food insecurity - worry: None  . Food insecurity - inability: None  . Transportation needs - medical: None  . Transportation needs - non-medical: None  Occupational History  . None  Tobacco Use  . Smoking status: Former Smoker    Types: Cigarettes    Last attempt to quit: 12/25/2011    Years since quitting: 5.0  . Smokeless tobacco: Never Used  . Tobacco comment:  smoked 1982-2013,less than 1 pack a day   Substance and Sexual Activity  . Alcohol use: Yes    Comment: Rarely  . Drug use: No  . Sexual activity: Yes    Birth control/protection: None  Other Topics Concern  . None  Social History Narrative   Regular exercise- no    Family History  Problem Relation Age of Onset  . Colon cancer Father        Stage 4  . Diabetes Father   . Coronary artery disease Mother        angioplasty in late 47s  . Hyperlipidemia Mother   . Depression Brother   . Bipolar disorder Brother   . Diabetes Brother   . Colon cancer Paternal Uncle   . Lung cancer Paternal Grandfather   . Colon cancer Paternal Grandfather   . Breast cancer Other        MGAunts  . Stroke Neg Hx     Review of Systems  Constitutional: Negative for chills and fever.  Respiratory: Positive for shortness of breath (with anxiety). Negative for cough and wheezing.   Cardiovascular: Positive for chest pain (mild, occasional from anxiety) and palpitations (with anxiety attacks). Negative for leg swelling.  Neurological: Positive for light-headedness (with more severe panic attack). Negative for headaches.  Psychiatric/Behavioral: Negative for decreased concentration. The patient is nervous/anxious.        Objective:   Vitals:   01/23/17 0805  BP:  140/80  Pulse: 85  Resp: 16  Temp: 98.4 F (36.9 C)  SpO2: 95%   Wt Readings from Last 3 Encounters:  01/23/17 220 lb (99.8 kg)  01/08/17 216 lb (98 kg)  12/03/16 216 lb (98 kg)   Body mass index is 37.76 kg/m.   Physical Exam    Constitutional: Appears well-developed and well-nourished. No distress.  HENT:  Head: Normocephalic and atraumatic.  Neck: Neck supple. No tracheal  deviation present. No thyromegaly present.  No cervical lymphadenopathy Cardiovascular: Normal rate, regular rhythm and normal heart sounds.   No murmur heard. No carotid bruit .  No edema Pulmonary/Chest: Effort normal and breath sounds normal. No respiratory distress. No has no wheezes. No rales.  Skin: Skin is warm and dry. Not diaphoretic.  Psychiatric: Normal mood and affect. Behavior is normal.      Assessment & Plan:    See Problem List for Assessment and Plan of chronic medical problems.

## 2017-01-22 NOTE — Patient Instructions (Addendum)
  Medications reviewed and updated.  No changes recommended at this time.     Please followup in 6 months   

## 2017-01-23 ENCOUNTER — Encounter: Payer: Self-pay | Admitting: Internal Medicine

## 2017-01-23 ENCOUNTER — Ambulatory Visit: Payer: BLUE CROSS/BLUE SHIELD | Admitting: Internal Medicine

## 2017-01-23 VITALS — BP 140/80 | HR 85 | Temp 98.4°F | Resp 16 | Wt 220.0 lb

## 2017-01-23 DIAGNOSIS — Z8679 Personal history of other diseases of the circulatory system: Secondary | ICD-10-CM | POA: Diagnosis not present

## 2017-01-23 DIAGNOSIS — R0789 Other chest pain: Secondary | ICD-10-CM | POA: Diagnosis not present

## 2017-01-23 DIAGNOSIS — F411 Generalized anxiety disorder: Secondary | ICD-10-CM | POA: Diagnosis not present

## 2017-01-23 NOTE — Assessment & Plan Note (Signed)
Increased anxiety recently - improving slowly Continue celexa - discussed possibly changing to a different medication if anxiety does not improve Keep clonazepam to only as needed - not regular use Continue 1/2 clonazepam as needed, especially for panic attacks

## 2017-01-23 NOTE — Assessment & Plan Note (Signed)
Continue atenolol daily Monitor BP  BP Readings from Last 3 Encounters:  01/23/17 140/80  01/08/17 (!) 143/79  12/03/16 (!) 142/82   BP has been slightly elevated here - possibly white coat htn No change in dose today

## 2017-01-23 NOTE — Assessment & Plan Note (Addendum)
Not cardiac in nature Work up in ED negative Having chest pain, palps and SOB with panic attacks Discussed anxiety / stress management  No further evaluation at this time

## 2017-01-27 ENCOUNTER — Encounter: Payer: Self-pay | Admitting: Internal Medicine

## 2017-02-04 ENCOUNTER — Other Ambulatory Visit: Payer: Self-pay | Admitting: Internal Medicine

## 2017-02-10 ENCOUNTER — Other Ambulatory Visit: Payer: Self-pay | Admitting: Internal Medicine

## 2017-03-11 ENCOUNTER — Other Ambulatory Visit: Payer: Self-pay | Admitting: Internal Medicine

## 2017-03-20 DIAGNOSIS — M5412 Radiculopathy, cervical region: Secondary | ICD-10-CM | POA: Diagnosis not present

## 2017-03-20 DIAGNOSIS — Z79891 Long term (current) use of opiate analgesic: Secondary | ICD-10-CM | POA: Diagnosis not present

## 2017-04-07 DIAGNOSIS — M50321 Other cervical disc degeneration at C4-C5 level: Secondary | ICD-10-CM | POA: Diagnosis not present

## 2017-04-20 DIAGNOSIS — Z79891 Long term (current) use of opiate analgesic: Secondary | ICD-10-CM | POA: Diagnosis not present

## 2017-04-20 DIAGNOSIS — M5030 Other cervical disc degeneration, unspecified cervical region: Secondary | ICD-10-CM | POA: Diagnosis not present

## 2017-04-20 DIAGNOSIS — G894 Chronic pain syndrome: Secondary | ICD-10-CM | POA: Diagnosis not present

## 2017-06-08 ENCOUNTER — Other Ambulatory Visit: Payer: Self-pay | Admitting: Internal Medicine

## 2017-07-13 ENCOUNTER — Other Ambulatory Visit: Payer: Self-pay | Admitting: Internal Medicine

## 2017-08-20 DIAGNOSIS — M5030 Other cervical disc degeneration, unspecified cervical region: Secondary | ICD-10-CM | POA: Diagnosis not present

## 2017-08-20 DIAGNOSIS — G894 Chronic pain syndrome: Secondary | ICD-10-CM | POA: Diagnosis not present

## 2017-08-23 ENCOUNTER — Other Ambulatory Visit: Payer: Self-pay | Admitting: Internal Medicine

## 2017-09-03 NOTE — Patient Instructions (Addendum)
All other Health Maintenance issues reviewed.   All recommended immunizations and age-appropriate screenings are up-to-date or discussed.  No immunizations administered today.   Medications reviewed and updated.  Changes include trying different inhalers.    Please followup in 6 months   Health Maintenance, Female Adopting a healthy lifestyle and getting preventive care can go a long way to promote health and wellness. Talk with your health care provider about what schedule of regular examinations is right for you. This is a good chance for you to check in with your provider about disease prevention and staying healthy. In between checkups, there are plenty of things you can do on your own. Experts have done a lot of research about which lifestyle changes and preventive measures are most likely to keep you healthy. Ask your health care provider for more information. Weight and diet Eat a healthy diet  Be sure to include plenty of vegetables, fruits, low-fat dairy products, and lean protein.  Do not eat a lot of foods high in solid fats, added sugars, or salt.  Get regular exercise. This is one of the most important things you can do for your health. ? Most adults should exercise for at least 150 minutes each week. The exercise should increase your heart rate and make you sweat (moderate-intensity exercise). ? Most adults should also do strengthening exercises at least twice a week. This is in addition to the moderate-intensity exercise.  Maintain a healthy weight  Body mass index (BMI) is a measurement that can be used to identify possible weight problems. It estimates body fat based on height and weight. Your health care provider can help determine your BMI and help you achieve or maintain a healthy weight.  For females 82 years of age and older: ? A BMI below 18.5 is considered underweight. ? A BMI of 18.5 to 24.9 is normal. ? A BMI of 25 to 29.9 is considered overweight. ? A BMI  of 30 and above is considered obese.  Watch levels of cholesterol and blood lipids  You should start having your blood tested for lipids and cholesterol at 53 years of age, then have this test every 5 years.  You may need to have your cholesterol levels checked more often if: ? Your lipid or cholesterol levels are high. ? You are older than 53 years of age. ? You are at high risk for heart disease.  Cancer screening Lung Cancer  Lung cancer screening is recommended for adults 42-67 years old who are at high risk for lung cancer because of a history of smoking.  A yearly low-dose CT scan of the lungs is recommended for people who: ? Currently smoke. ? Have quit within the past 15 years. ? Have at least a 30-pack-year history of smoking. A pack year is smoking an average of one pack of cigarettes a day for 1 year.  Yearly screening should continue until it has been 15 years since you quit.  Yearly screening should stop if you develop a health problem that would prevent you from having lung cancer treatment.  Breast Cancer  Practice breast self-awareness. This means understanding how your breasts normally appear and feel.  It also means doing regular breast self-exams. Let your health care provider know about any changes, no matter how small.  If you are in your 20s or 30s, you should have a clinical breast exam (CBE) by a health care provider every 1-3 years as part of a regular health exam.  If  you are 40 or older, have a CBE every year. Also consider having a breast X-ray (mammogram) every year.  If you have a family history of breast cancer, talk to your health care provider about genetic screening.  If you are at high risk for breast cancer, talk to your health care provider about having an MRI and a mammogram every year.  Breast cancer gene (BRCA) assessment is recommended for women who have family members with BRCA-related cancers. BRCA-related cancers  include: ? Breast. ? Ovarian. ? Tubal. ? Peritoneal cancers.  Results of the assessment will determine the need for genetic counseling and BRCA1 and BRCA2 testing.  Cervical Cancer Your health care provider may recommend that you be screened regularly for cancer of the pelvic organs (ovaries, uterus, and vagina). This screening involves a pelvic examination, including checking for microscopic changes to the surface of your cervix (Pap test). You may be encouraged to have this screening done every 3 years, beginning at age 27.  For women ages 12-65, health care providers may recommend pelvic exams and Pap testing every 3 years, or they may recommend the Pap and pelvic exam, combined with testing for human papilloma virus (HPV), every 5 years. Some types of HPV increase your risk of cervical cancer. Testing for HPV may also be done on women of any age with unclear Pap test results.  Other health care providers may not recommend any screening for nonpregnant women who are considered low risk for pelvic cancer and who do not have symptoms. Ask your health care provider if a screening pelvic exam is right for you.  If you have had past treatment for cervical cancer or a condition that could lead to cancer, you need Pap tests and screening for cancer for at least 20 years after your treatment. If Pap tests have been discontinued, your risk factors (such as having a new sexual partner) need to be reassessed to determine if screening should resume. Some women have medical problems that increase the chance of getting cervical cancer. In these cases, your health care provider may recommend more frequent screening and Pap tests.  Colorectal Cancer  This type of cancer can be detected and often prevented.  Routine colorectal cancer screening usually begins at 53 years of age and continues through 53 years of age.  Your health care provider may recommend screening at an earlier age if you have risk factors  for colon cancer.  Your health care provider may also recommend using home test kits to check for hidden blood in the stool.  A small camera at the end of a tube can be used to examine your colon directly (sigmoidoscopy or colonoscopy). This is done to check for the earliest forms of colorectal cancer.  Routine screening usually begins at age 33.  Direct examination of the colon should be repeated every 5-10 years through 53 years of age. However, you may need to be screened more often if early forms of precancerous polyps or small growths are found.  Skin Cancer  Check your skin from head to toe regularly.  Tell your health care provider about any new moles or changes in moles, especially if there is a change in a mole's shape or color.  Also tell your health care provider if you have a mole that is larger than the size of a pencil eraser.  Always use sunscreen. Apply sunscreen liberally and repeatedly throughout the day.  Protect yourself by wearing long sleeves, pants, a wide-brimmed hat, and sunglasses  whenever you are outside.  Heart disease, diabetes, and high blood pressure  High blood pressure causes heart disease and increases the risk of stroke. High blood pressure is more likely to develop in: ? People who have blood pressure in the high end of the normal range (130-139/85-89 mm Hg). ? People who are overweight or obese. ? People who are African American.  If you are 57-11 years of age, have your blood pressure checked every 3-5 years. If you are 39 years of age or older, have your blood pressure checked every year. You should have your blood pressure measured twice-once when you are at a hospital or clinic, and once when you are not at a hospital or clinic. Record the average of the two measurements. To check your blood pressure when you are not at a hospital or clinic, you can use: ? An automated blood pressure machine at a pharmacy. ? A home blood pressure monitor.  If  you are between 44 years and 33 years old, ask your health care provider if you should take aspirin to prevent strokes.  Have regular diabetes screenings. This involves taking a blood sample to check your fasting blood sugar level. ? If you are at a normal weight and have a low risk for diabetes, have this test once every three years after 53 years of age. ? If you are overweight and have a high risk for diabetes, consider being tested at a younger age or more often. Preventing infection Hepatitis B  If you have a higher risk for hepatitis B, you should be screened for this virus. You are considered at high risk for hepatitis B if: ? You were born in a country where hepatitis B is common. Ask your health care provider which countries are considered high risk. ? Your parents were born in a high-risk country, and you have not been immunized against hepatitis B (hepatitis B vaccine). ? You have HIV or AIDS. ? You use needles to inject street drugs. ? You live with someone who has hepatitis B. ? You have had sex with someone who has hepatitis B. ? You get hemodialysis treatment. ? You take certain medicines for conditions, including cancer, organ transplantation, and autoimmune conditions.  Hepatitis C  Blood testing is recommended for: ? Everyone born from [birth year] through 1966. ? Anyone with known risk factors for hepatitis C.  Sexually transmitted infections (STIs)  You should be screened for sexually transmitted infections (STIs) including gonorrhea and chlamydia if: ? You are sexually active and are younger than 53 years of age. ? You are older than 53 years of age and your health care provider tells you that you are at risk for this type of infection. ? Your sexual activity has changed since you were last screened and you are at an increased risk for chlamydia or gonorrhea. Ask your health care provider if you are at risk.  If you do not have HIV, but are at risk, it may be recommended  that you take a prescription medicine daily to prevent HIV infection. This is called pre-exposure prophylaxis (PrEP). You are considered at risk if: ? You are sexually active and do not regularly use condoms or know the HIV status of your partner(s). ? You take drugs by injection. ? You are sexually active with a partner who has HIV.  Talk with your health care provider about whether you are at high risk of being infected with HIV. If you choose to begin PrEP, you should  first be tested for HIV. You should then be tested every 3 months for as long as you are taking PrEP. Pregnancy  If you are premenopausal and you may become pregnant, ask your health care provider about preconception counseling.  If you may become pregnant, take 400 to 800 micrograms (mcg) of folic acid every day.  If you want to prevent pregnancy, talk to your health care provider about birth control (contraception). Osteoporosis and menopause  Osteoporosis is a disease in which the bones lose minerals and strength with aging. This can result in serious bone fractures. Your risk for osteoporosis can be identified using a bone density scan.  If you are 50 years of age or older, or if you are at risk for osteoporosis and fractures, ask your health care provider if you should be screened.  Ask your health care provider whether you should take a calcium or vitamin D supplement to lower your risk for osteoporosis.  Menopause may have certain physical symptoms and risks.  Hormone replacement therapy may reduce some of these symptoms and risks. Talk to your health care provider about whether hormone replacement therapy is right for you. Follow these instructions at home:  Schedule regular health, dental, and eye exams.  Stay current with your immunizations.  Do not use any tobacco products including cigarettes, chewing tobacco, or electronic cigarettes.  If you are pregnant, do not drink alcohol.  If you are  breastfeeding, limit how much and how often you drink alcohol.  Limit alcohol intake to no more than 1 drink per day for nonpregnant women. One drink equals 12 ounces of beer, 5 ounces of wine, or 1 ounces of hard liquor.  Do not use street drugs.  Do not share needles.  Ask your health care provider for help if you need support or information about quitting drugs.  Tell your health care provider if you often feel depressed.  Tell your health care provider if you have ever been abused or do not feel safe at home. This information is not intended to replace advice given to you by your health care provider. Make sure you discuss any questions you have with your health care provider. Document Released: 07/22/2010 Document Revised: 06/14/2015 Document Reviewed: 10/10/2014 Elsevier Interactive Patient Education  [employer].

## 2017-09-03 NOTE — Progress Notes (Signed)
Subjective:    Patient ID: Amanda [name], female    DOB: 15-Dec-1965, 52 y.o.   MRN: [id number]  HPI She is here for a physical exam.   Asthma:  dulera worked.  symbicort not working.  She gets SOB with exertion and has chest tightness.  She does have some cough and wheeze.  She uses the ventolin as needed.    Left lateral knee swells.  Sitting and applying heat/cold help.  No injuries.  It was painful and improving.    Medications and allergies reviewed with patient and updated if appropriate.  Patient Active Problem List   Diagnosis Date Noted  . Chest pain 01/22/2017  . Prediabetes 12/03/2016  . Anxiety state 05/26/2014  . DDD (degenerative disc disease), cervical 12/16/2012  . GERD (gastroesophageal reflux disease) 11/08/2012  . H/O ventricular tachycardia 11/08/2012  . RBBB 03/29/2012  . HERPES ZOSTER 02/11/2010  . COLONIC POLYPS, HX OF 02/04/2010  . OVARIAN CYST, LEFT 11/17/2008  . Mixed hyperlipidemia 09/27/2008  . ALLERGIC RHINITIS 09/27/2008  . Asthma 09/27/2008    Current Outpatient Medications on File Prior to Visit  Medication Sig Dispense Refill  . atenolol (TENORMIN) 50 MG tablet Take 1 tablet (50 mg total) by mouth daily. 90 tablet 0  . budesonide-formoterol (SYMBICORT) 80-4.5 MCG/ACT inhaler Inhale 2 puffs into the lungs daily. 10.2 g 0  . citalopram (CELEXA) 20 MG tablet TAKE 1 TABLET(20 MG) BY MOUTH DAILY 90 tablet 1  . clonazePAM (KLONOPIN) 0.5 MG tablet TAKE 1 TO 1 1/2 TABLETS BY MOUTH EVERY 8 TO 12 HOURS AS NEEDED (Patient taking differently: Take 0.5-0.75 mg by mouth every 8 (eight) hours as needed for anxiety. ) 30 tablet 2  . famotidine (PEPCID) 20 MG tablet Take 1 tablet (20 mg total) by mouth daily as needed for heartburn or indigestion.    Marland Kitchen HYDROcodone-acetaminophen (NORCO) 10-325 MG per tablet Take 1 tablet by mouth 5 (five) times daily.     . naloxone (NARCAN) nasal spray 4 mg/0.1 mL Place 1 spray into the nose once as needed (overdose).    .  VENTOLIN HFA 108 (90 Base) MCG/ACT inhaler INHALE 2 PUFFS BY MOUTH EVERY 4 HOURS AS NEEDED (Patient taking differently: INHALE 2 PUFFS BY MOUTH EVERY 4 HOURS AS NEEDED FOR WHEEZING) 18 g 1   No current facility-administered medications on file prior to visit.     Past Medical History:  Diagnosis Date  . Allergy    rhinitis, seasonal  . Anxiety   . Asthma    extrinsic  . GERD (gastroesophageal reflux disease)   . Hemorrhoids   . Hx of colonic polyps 2010   hyperplastic  . Hyperglycemia    borderline  . Hyperlipidemia    borderline  . Tachycardia, paroxysmal ([place])    Dr Caryl Comes    Past Surgical History:  Procedure Laterality Date  . ABDOMINAL HYSTERECTOMY  2004   For Abnormal Pap & painful menses, Dr Deatra Ina  . COLONOSCOPY W/ POLYPECTOMY  11/2008   Dr Oletta Lamas  . ESI     X 2; Dr Nelva Bush  . UPPER GI ENDOSCOPY  04/2012   esophageal erosions ; Dr Oletta Lamas  . WISDOM TOOTH EXTRACTION  02/13/2009    Social History   Socioeconomic History  . Marital status: Married    Spouse name: Not on file  . Number of children: Not on file  . Years of education: Not on file  . Highest education level: Not on file  Occupational History  .  Not on file  Social Needs  . Financial resource strain: Not on file  . Food insecurity:    Worry: Not on file    Inability: Not on file  . Transportation needs:    Medical: Not on file    Non-medical: Not on file  Tobacco Use  . Smoking status: Former Smoker    Types: Cigarettes    Last attempt to quit: 12/25/2011    Years since quitting: 5.6  . Smokeless tobacco: Never Used  . Tobacco comment:  smoked 1982-2013,less than 1 pack a day   Substance and Sexual Activity  . Alcohol use: Yes    Comment: Rarely  . Drug use: No  . Sexual activity: Yes    Birth control/protection: None  Lifestyle  . Physical activity:    Days per week: Not on file    Minutes per session: Not on file  . Stress: Not on file  Relationships  . Social connections:     Talks on phone: Not on file    Gets together: Not on file    Attends religious service: Not on file    Active member of club or organization: Not on file    Attends meetings of clubs or organizations: Not on file    Relationship status: Not on file  Other Topics Concern  . Not on file  Social History Narrative   Regular exercise- no    Family History  Problem Relation Age of Onset  . Colon cancer Father        Stage 4  . Diabetes Father   . Coronary artery disease Mother        angioplasty in late 35s  . Hyperlipidemia Mother   . Depression Brother   . Bipolar disorder Brother   . Diabetes Brother   . Colon cancer Paternal Uncle   . Lung cancer Paternal Grandfather   . Colon cancer Paternal Grandfather   . Breast cancer Other        MGAunts  . Stroke Neg Hx     Review of Systems  Constitutional: Negative for chills, fatigue and fever.  Eyes: Negative for visual disturbance.  Respiratory: Positive for chest tightness, shortness of breath and wheezing. Negative for cough.   Cardiovascular: Positive for leg swelling (occ). Negative for chest pain and palpitations.  Gastrointestinal: Negative for abdominal pain, blood in stool, constipation, diarrhea and nausea.       Gerd controlled as long as she takes the medication  Genitourinary: Negative for dysuria and hematuria.  Musculoskeletal: Positive for arthralgias (left knee).  Skin: Negative for color change and rash.  Neurological: Negative for light-headedness and headaches.  Psychiatric/Behavioral: Negative for dysphoric mood and sleep disturbance. The patient is nervous/anxious (controlled).        Objective:   Vitals:   09/04/17 1541  BP: 132/72  Pulse: 82  Resp: 16  Temp: 97.8 F (36.6 C)  SpO2: 96%   Filed Weights   09/04/17 1541  Weight: 218 lb (98.9 kg)   Body mass index is 37.42 kg/m.  Wt Readings from Last 3 Encounters:  09/04/17 218 lb (98.9 kg)  01/23/17 220 lb (99.8 kg)  01/08/17 216 lb (98  kg)     Physical Exam Constitutional: She appears well-developed and well-nourished. No distress.  HENT:  Head: Normocephalic and atraumatic.  Right Ear: External ear normal. Normal ear canal and TM Left Ear: External ear normal.  Normal ear canal and TM Mouth/Throat: Oropharynx is clear and moist.  Eyes: Conjunctivae and EOM are normal.  Neck: Neck supple. No tracheal deviation present. No thyromegaly present.  No carotid bruit  Cardiovascular: Normal rate, regular rhythm and normal heart sounds.   No murmur heard.  No edema. Pulmonary/Chest: Effort normal No respiratory distress. Mild diffuse inspiratory and expiratory wheezes. She has no rales.  Breast: deferred to Gyn Abdominal: Soft. She exhibits no distension. There is no tenderness.  Lymphadenopathy: She has no cervical adenopathy.  Skin: Skin is warm and dry. She is not diaphoretic.  Psychiatric: She has a normal mood and affect. Her behavior is normal.        Assessment & Plan:   Physical exam: Screening blood work  ordered Immunizations   Discussed shingrix, others up to date Colonoscopy  - rescheduled - will do this fall Mammogram   Due - will schedule Gyn  - no longer seeing Eye exams  Up to date  EKG   Done 12/2016 Exercise   More active, walks dog, no regular exercise Weight  encouraged weight loss Skin  No concerns Substance abuse   none   Left knee is improving - she will continue to monitor and treat symptomatically  - she will call if symptoms do not resolve or if she wants to see sports med   See Problem List for Assessment and Plan of chronic medical problems.

## 2017-09-04 ENCOUNTER — Ambulatory Visit (INDEPENDENT_AMBULATORY_CARE_PROVIDER_SITE_OTHER): Payer: BLUE CROSS/BLUE SHIELD | Admitting: Internal Medicine

## 2017-09-04 ENCOUNTER — Encounter: Payer: Self-pay | Admitting: Internal Medicine

## 2017-09-04 VITALS — BP 132/72 | HR 82 | Temp 97.8°F | Resp 16 | Wt 218.0 lb

## 2017-09-04 DIAGNOSIS — E782 Mixed hyperlipidemia: Secondary | ICD-10-CM | POA: Diagnosis not present

## 2017-09-04 DIAGNOSIS — F411 Generalized anxiety disorder: Secondary | ICD-10-CM

## 2017-09-04 DIAGNOSIS — J4520 Mild intermittent asthma, uncomplicated: Secondary | ICD-10-CM

## 2017-09-04 DIAGNOSIS — Z0001 Encounter for general adult medical examination with abnormal findings: Secondary | ICD-10-CM

## 2017-09-04 DIAGNOSIS — R7303 Prediabetes: Secondary | ICD-10-CM

## 2017-09-04 NOTE — Assessment & Plan Note (Signed)
celexa effective Uses clonazepam as needed Will continue

## 2017-09-05 NOTE — Assessment & Plan Note (Signed)
Not controlled - wheezing on exam symbicort is not working - Dentist worked but is not covered She has tried other inhalers but not sure what worked Samples of dulera, breo and Careers information officer given - she will try these to see what works Can also try flovent and advair if covered - she has been on these in the past but does not recall if effective Continue albuterol as needed

## 2017-09-05 NOTE — Assessment & Plan Note (Signed)
Check a1c Low sugar / carb diet Stressed regular exercise, weight loss  

## 2017-09-05 NOTE — Assessment & Plan Note (Signed)
Check lipid panel  Not on medication Regular exercise and healthy diet encouraged  

## 2017-09-22 ENCOUNTER — Other Ambulatory Visit: Payer: Self-pay | Admitting: Internal Medicine

## 2017-09-22 DIAGNOSIS — F411 Generalized anxiety disorder: Secondary | ICD-10-CM

## 2017-09-22 NOTE — Telephone Encounter (Signed)
Last refill was 05/13/17 Last OV was 09/04/17 Next OV 03/15/18

## 2017-11-25 ENCOUNTER — Encounter: Payer: Self-pay | Admitting: Internal Medicine

## 2017-11-25 MED ORDER — MOMETASONE FURO-FORMOTEROL FUM 200-5 MCG/ACT IN AERO: 2.0000 | INHALATION_SPRAY | Freq: Two times a day (BID) | RESPIRATORY_TRACT | 11 refills | Status: DC

## 2017-12-06 ENCOUNTER — Other Ambulatory Visit: Payer: Self-pay | Admitting: Internal Medicine

## 2017-12-09 ENCOUNTER — Encounter: Payer: Self-pay | Admitting: Internal Medicine

## 2017-12-09 LAB — LIPID PANEL
Cholesterol: 263 — AB (ref 0–200)
HDL: 53 (ref 35–70)
LDL CALC: 174
LDl/HDL Ratio: 5
Triglycerides: 203 — AB (ref 40–160)

## 2017-12-09 LAB — HEMOGLOBIN A1C: HEMOGLOBIN A1C: 6.1 — AB (ref 4.0–6.0)

## 2017-12-09 NOTE — Telephone Encounter (Signed)
Documented flu shot.../LMB  

## 2017-12-11 ENCOUNTER — Encounter: Payer: Self-pay | Admitting: Internal Medicine

## 2017-12-14 ENCOUNTER — Encounter: Payer: Self-pay | Admitting: Internal Medicine

## 2017-12-15 MED ORDER — ROSUVASTATIN CALCIUM 5 MG PO TABS: 5.0000 mg | ORAL_TABLET | Freq: Every day | ORAL | 5 refills | Status: DC

## 2017-12-21 DIAGNOSIS — M5030 Other cervical disc degeneration, unspecified cervical region: Secondary | ICD-10-CM | POA: Diagnosis not present

## 2017-12-21 DIAGNOSIS — G894 Chronic pain syndrome: Secondary | ICD-10-CM | POA: Diagnosis not present

## 2018-01-04 ENCOUNTER — Other Ambulatory Visit: Payer: Self-pay | Admitting: Internal Medicine

## 2018-01-04 DIAGNOSIS — F411 Generalized anxiety disorder: Secondary | ICD-10-CM

## 2018-01-05 NOTE — Telephone Encounter (Signed)
[place] Controlled Substance Database checked. Last filled on 09/22/17  Last OV 09/04/17

## 2018-01-06 ENCOUNTER — Encounter: Payer: Self-pay | Admitting: Internal Medicine

## 2018-01-07 MED ORDER — OSELTAMIVIR PHOSPHATE 75 MG PO CAPS: 75.0000 mg | ORAL_CAPSULE | Freq: Every day | ORAL | 0 refills | Status: DC

## 2018-03-03 ENCOUNTER — Other Ambulatory Visit: Payer: Self-pay | Admitting: Internal Medicine

## 2018-03-13 NOTE — Progress Notes (Signed)
Subjective:    Patient ID: Amanda [name], female    DOB: [birth year], 54 y.o.   MRN: [id number]  HPI The patient is here for follow up.  Cold symptoms, asthma exacerbation:  They started mid last week.  She has rattling in her throat.  She has coughing that is productive of dark yellow sputum, wheezing, chest tightness, SOB with exertion, sore throat.  She is taking mucinex and using her dulera. She has been in the hospital a lot with her mom who was in the ICU - she spent three nights in the hospital.  Prediabetes:  She is compliant with a low sugar/carbohydrate diet.  She is exercising, but not recently.  GERD:  She is taking otc medication as needed.  She denies any GERD symptoms and feels her GERD is well controlled.   Hyperlipidemia: She is taking her medication daily. She thinks that she has some joint issues.  She is compliant with a low fat/cholesterol diet. She is exercising some. She denies myalgias.   Anxiety: She is taking her medication daily as prescribed. She denies any side effects from the medication. She feels her anxiety is well controlled and she is happy with her current dose of medication.    Medications and allergies reviewed with patient and updated if appropriate.  Patient Active Problem List   Diagnosis Date Noted  . Chest pain 01/22/2017  . Prediabetes 12/03/2016  . Anxiety state 05/26/2014  . DDD (degenerative disc disease), cervical 12/16/2012  . GERD (gastroesophageal reflux disease) 11/08/2012  . H/O ventricular tachycardia 11/08/2012  . RBBB 03/29/2012  . HERPES ZOSTER 02/11/2010  . COLONIC POLYPS, HX OF 02/04/2010  . OVARIAN CYST, LEFT 11/17/2008  . Mixed hyperlipidemia 09/27/2008  . ALLERGIC RHINITIS 09/27/2008  . Asthma 09/27/2008    Current Outpatient Medications on File Prior to Visit  Medication Sig Dispense Refill  . atenolol (TENORMIN) 50 MG tablet Take 1 tablet (50 mg total) daily. 90 tablet 1  . citalopram (CELEXA) 20 MG tablet TAKE 1  TABLET(20 MG) BY MOUTH DAILY 90 tablet 1  . clonazePAM (KLONOPIN) 0.5 MG tablet TAKE 1 TO 1 AND 1/2 TABLETS BY MOUTH EVERY 8 TO 12 HOURS AS NEEDED 30 tablet 0  . HYDROcodone-acetaminophen (NORCO) 10-325 MG per tablet Take 1 tablet by mouth 5 (five) times daily.     . mometasone-formoterol (DULERA) 200-5 MCG/ACT AERO Inhale 2 puffs into the lungs 2 (two) times daily. 1 Inhaler 11  . naloxone (NARCAN) nasal spray 4 mg/0.1 mL Place 1 spray into the nose once as needed (overdose).    . rosuvastatin (CRESTOR) 5 MG tablet Take 1 tablet (5 mg total) by mouth daily. 30 tablet 5  . VENTOLIN HFA 108 (90 Base) MCG/ACT inhaler INHALE 2 PUFFS BY MOUTH EVERY 4 HOURS AS NEEDED (Patient taking differently: INHALE 2 PUFFS BY MOUTH EVERY 4 HOURS AS NEEDED FOR WHEEZING) 18 g 1   No current facility-administered medications on file prior to visit.     Past Medical History:  Diagnosis Date  . Allergy    rhinitis, seasonal  . Anxiety   . Asthma    extrinsic  . GERD (gastroesophageal reflux disease)   . Hemorrhoids   . Hx of colonic polyps 2010   hyperplastic  . Hyperglycemia    borderline  . Hyperlipidemia    borderline  . Tachycardia, paroxysmal ([place])    Dr Caryl Comes    Past Surgical History:  Procedure Laterality Date  . ABDOMINAL HYSTERECTOMY  2004   For Abnormal Pap & painful menses, Dr Deatra Ina  . COLONOSCOPY W/ POLYPECTOMY  11/2008   Dr Oletta Lamas  . ESI     X 2; Dr Nelva Bush  . UPPER GI ENDOSCOPY  04/2012   esophageal erosions ; Dr Oletta Lamas  . WISDOM TOOTH EXTRACTION  02/13/2009    Social History   Socioeconomic History  . Marital status: Married    Spouse name: Not on file  . Number of children: Not on file  . Years of education: Not on file  . Highest education level: Not on file  Occupational History  . Not on file  Social Needs  . Financial resource strain: Not on file  . Food insecurity:    Worry: Not on file    Inability: Not on file  . Transportation needs:    Medical: Not on file     Non-medical: Not on file  Tobacco Use  . Smoking status: Former Smoker    Types: Cigarettes    Last attempt to quit: 12/25/2011    Years since quitting: 6.2  . Smokeless tobacco: Never Used  . Tobacco comment:  smoked 1982-2013,less than 1 pack a day   Substance and Sexual Activity  . Alcohol use: Yes    Comment: Rarely  . Drug use: No  . Sexual activity: Yes    Birth control/protection: None  Lifestyle  . Physical activity:    Days per week: Not on file    Minutes per session: Not on file  . Stress: Not on file  Relationships  . Social connections:    Talks on phone: Not on file    Gets together: Not on file    Attends religious service: Not on file    Active member of club or organization: Not on file    Attends meetings of clubs or organizations: Not on file    Relationship status: Not on file  Other Topics Concern  . Not on file  Social History Narrative   Regular exercise- no    Family History  Problem Relation Age of Onset  . Colon cancer Father        Stage 4  . Diabetes Father   . Coronary artery disease Mother        angioplasty in late 74s  . Hyperlipidemia Mother   . Depression Brother   . Bipolar disorder Brother   . Diabetes Brother   . Colon cancer Paternal Uncle   . Lung cancer Paternal Grandfather   . Colon cancer Paternal Grandfather   . Breast cancer Other        MGAunts  . Stroke Neg Hx     Review of Systems  Constitutional: Positive for chills and fever.  HENT: Positive for ear pain and sore throat. Negative for congestion and sinus pain.   Respiratory: Positive for cough, chest tightness, shortness of breath and wheezing.   Cardiovascular: Negative for chest pain, palpitations and leg swelling.  Neurological: Positive for light-headedness and headaches (mild).       Objective:   Vitals:   03/15/18 0944  BP: (!) 144/88  Pulse: 73  Resp: 18  Temp: 99.8 F (37.7 C)  SpO2: 94%   BP Readings from Last 3 Encounters:  03/15/18  (!) 144/88  09/04/17 132/72  01/23/17 140/80   Wt Readings from Last 3 Encounters:  03/15/18 226 lb 12.8 oz (102.9 kg)  09/04/17 218 lb (98.9 kg)  01/23/17 220 lb (99.8 kg)   Body mass index  is 38.93 kg/m.   Physical Exam    Constitutional: Appears well-developed and well-nourished. No distress.  HENT:  Head: Normocephalic and atraumatic.  Neck: Neck supple. No tracheal deviation present. No thyromegaly present.  No cervical lymphadenopathy Cardiovascular: Normal rate, regular rhythm and normal heart sounds.   No murmur heard. No carotid bruit .  No edema Pulmonary/Chest: Effort normal and breath sounds normal. No respiratory distress. No has no wheezes. No rales.  Skin: Skin is warm and dry. Not diaphoretic.  Psychiatric: Normal mood and affect. Behavior is normal.      Assessment & Plan:    See Problem List for Assessment and Plan of chronic medical problems.

## 2018-03-13 NOTE — Patient Instructions (Addendum)
You had a steroid injection today.   Have blood work and a chest x-ray today.   Tests ordered today. Your results will be released to [place] (or called to you) after review, usually within 72hours after test completion. If any changes need to be made, you will be notified at that same time.  Medications reviewed and updated.  Changes include :     Hycodan cough syrup Two antibiotics - zpak, omnicef Prednisone - start tomorrow with breakfast.  Your prescription(s) have been submitted to your pharmacy. Please take as directed and contact our office if you believe you are having problem(s) with the medication(s).   Please followup in 6 months

## 2018-03-15 ENCOUNTER — Encounter: Payer: Self-pay | Admitting: Internal Medicine

## 2018-03-15 ENCOUNTER — Ambulatory Visit: Payer: BLUE CROSS/BLUE SHIELD | Admitting: Internal Medicine

## 2018-03-15 ENCOUNTER — Ambulatory Visit (INDEPENDENT_AMBULATORY_CARE_PROVIDER_SITE_OTHER)
Admission: RE | Admit: 2018-03-15 | Discharge: 2018-03-15 | Disposition: A | Discharge status: Home / Self Care | Payer: BLUE CROSS/BLUE SHIELD | Source: Ambulatory Visit | Attending: Internal Medicine | Admitting: Internal Medicine

## 2018-03-15 ENCOUNTER — Other Ambulatory Visit (INDEPENDENT_AMBULATORY_CARE_PROVIDER_SITE_OTHER): Payer: BLUE CROSS/BLUE SHIELD

## 2018-03-15 VITALS — BP 144/88 | HR 73 | Temp 99.8°F | Resp 18 | Ht 64.0 in | Wt 226.8 lb

## 2018-03-15 DIAGNOSIS — R509 Fever, unspecified: Secondary | ICD-10-CM

## 2018-03-15 DIAGNOSIS — J209 Acute bronchitis, unspecified: Secondary | ICD-10-CM

## 2018-03-15 DIAGNOSIS — J4520 Mild intermittent asthma, uncomplicated: Secondary | ICD-10-CM

## 2018-03-15 DIAGNOSIS — J4541 Moderate persistent asthma with (acute) exacerbation: Secondary | ICD-10-CM

## 2018-03-15 DIAGNOSIS — F411 Generalized anxiety disorder: Secondary | ICD-10-CM | POA: Diagnosis not present

## 2018-03-15 DIAGNOSIS — E782 Mixed hyperlipidemia: Secondary | ICD-10-CM | POA: Diagnosis not present

## 2018-03-15 DIAGNOSIS — R7303 Prediabetes: Secondary | ICD-10-CM

## 2018-03-15 DIAGNOSIS — K219 Gastro-esophageal reflux disease without esophagitis: Secondary | ICD-10-CM | POA: Diagnosis not present

## 2018-03-15 DIAGNOSIS — R05 Cough: Secondary | ICD-10-CM | POA: Diagnosis not present

## 2018-03-15 DIAGNOSIS — J45901 Unspecified asthma with (acute) exacerbation: Secondary | ICD-10-CM | POA: Insufficient documentation

## 2018-03-15 LAB — COMPREHENSIVE METABOLIC PANEL
ALT: 17 U/L (ref 0–35)
AST: 18 U/L (ref 0–37)
Albumin: 4.5 g/dL (ref 3.5–5.2)
Alkaline Phosphatase: 75 U/L (ref 39–117)
BILIRUBIN TOTAL: 0.4 mg/dL (ref 0.2–1.2)
BUN: 7 mg/dL (ref 6–23)
CHLORIDE: 101 meq/L (ref 96–112)
CO2: 33 meq/L — AB (ref 19–32)
Calcium: 9.5 mg/dL (ref 8.4–10.5)
Creatinine, Ser: 0.8 mg/dL (ref 0.40–1.20)
GFR: 74.94 mL/min (ref >60.00)
GLUCOSE: 96 mg/dL (ref 70–99)
POTASSIUM: 4.8 meq/L (ref 3.5–5.1)
Sodium: 141 mEq/L (ref 135–145)
Total Protein: 7.8 g/dL (ref 6.0–8.3)

## 2018-03-15 LAB — LIPID PANEL
CHOL/HDL RATIO: 3
CHOLESTEROL: 173 mg/dL (ref 0–200)
HDL: 52.1 mg/dL (ref >39.00)
LDL CALC: 86 mg/dL (ref 0–99)
NONHDL: 120.49
Triglycerides: 174 mg/dL — ABNORMAL HIGH (ref 0.0–149.0)
VLDL: 34.8 mg/dL (ref 0.0–40.0)

## 2018-03-15 LAB — HEMOGLOBIN A1C: HEMOGLOBIN A1C: 6.1 % (ref 4.6–6.5)

## 2018-03-15 LAB — POCT INFLUENZA A/B
Influenza A, POC: NEGATIVE
Influenza B, POC: NEGATIVE

## 2018-03-15 MED ORDER — CEFDINIR 300 MG PO CAPS: 300.0000 mg | ORAL_CAPSULE | Freq: Two times a day (BID) | ORAL | 0 refills | Status: DC

## 2018-03-15 MED ORDER — HYDROCOD POLST-CPM POLST ER 10-8 MG/5ML PO SUER: 5.0000 mL | Freq: Two times a day (BID) | ORAL | 0 refills | Status: DC | PRN

## 2018-03-15 MED ORDER — METHYLPREDNISOLONE ACETATE 80 MG/ML IJ SUSP
80.0000 mg | Freq: Once | INTRAMUSCULAR | Status: AC
  Administered 2018-03-15: 80 mg via INTRAMUSCULAR

## 2018-03-15 MED ORDER — PREDNISONE 20 MG PO TABS: 40.0000 mg | ORAL_TABLET | Freq: Every day | ORAL | 0 refills | Status: DC

## 2018-03-15 MED ORDER — AZITHROMYCIN 250 MG PO TABS: ORAL_TABLET | ORAL | 0 refills | Status: DC

## 2018-03-15 NOTE — Assessment & Plan Note (Signed)
Having some hand joint pain but it is not bad and she still wants to continue the medication Check labs Can consider crestor TIW to see if that improves joint pain Exercise and low fat/chol diet advised

## 2018-03-15 NOTE — Assessment & Plan Note (Signed)
Symptoms c/w acute bronchitis with asthma exacerbation, but need to r/o PNA cxr today Start zpak, omnicef since this is hospital acquired Depo-medrrol 80 mg IM x 1 Prednisone x 5 days starting tomorrow tussionex otc cold medications Call if no improvement

## 2018-03-15 NOTE — Assessment & Plan Note (Signed)
GERD controlled Continue daily medication  

## 2018-03-15 NOTE — Assessment & Plan Note (Signed)
Check a1c Low sugar / carb diet Stressed regular exercise   

## 2018-03-15 NOTE — Assessment & Plan Note (Signed)
Controlled, stable Continue current dose of medication  

## 2018-03-18 ENCOUNTER — Encounter: Payer: Self-pay | Admitting: Internal Medicine

## 2018-03-22 ENCOUNTER — Other Ambulatory Visit: Payer: Self-pay | Admitting: Internal Medicine

## 2018-03-22 ENCOUNTER — Encounter: Payer: Self-pay | Admitting: Internal Medicine

## 2018-03-24 NOTE — Telephone Encounter (Signed)
Set up an appointment tomorrow at 10:45 for follow up

## 2018-03-24 NOTE — Progress Notes (Signed)
Subjective:    Patient ID: Amanda [name], female    DOB: [birth year], 53 y.o.   MRN: [id number]  HPI The patient is here for follow up of cold symptoms.  She saw me 10 days ago.  I diagnosed her with bronchitis and asthma exacerbation.  We started her on antibiotics, prednisone, cough syrup and she continued her inhalers.  She had received a steroid injection here in the office that day.  She completed the Z-Pak and is about to finish the [place].  She completed the prednisone, but did take additional prednisone she had at home.  She use the cough syrup for a couple of days, but no longer needed after that.  She has been using her maintenance inhaler twice a day and has used her albuterol only a couple of times.  She has done the nebulizer treatments a couple of times, but not recently.  She is also taking Mucinex.  She is still symptomatic and I asked her to follow-up so that we could review her symptoms.  She is experiencing chest tightness, wheezing, cough that is primarily dry, but occasionally she will bring up some cloudy, thick sputum.  She does have some shortness of breath, but that occurs more with talking a lot and some exertion.    Medications and allergies reviewed with patient and updated if appropriate.  Patient Active Problem List   Diagnosis Date Noted  . Asthma exacerbation 03/15/2018  . Acute bronchitis 03/15/2018  . Chest pain 01/22/2017  . Prediabetes 12/03/2016  . Anxiety state 05/26/2014  . DDD (degenerative disc disease), cervical 12/16/2012  . GERD (gastroesophageal reflux disease) 11/08/2012  . H/O ventricular tachycardia 11/08/2012  . RBBB 03/29/2012  . HERPES ZOSTER 02/11/2010  . COLONIC POLYPS, HX OF 02/04/2010  . OVARIAN CYST, LEFT 11/17/2008  . Mixed hyperlipidemia 09/27/2008  . ALLERGIC RHINITIS 09/27/2008  . Asthma 09/27/2008    Current Outpatient Medications on File Prior to Visit  Medication Sig Dispense Refill  . albuterol (PROVENTIL  HFA;VENTOLIN HFA) 108 (90 Base) MCG/ACT inhaler INHALE 2 PUFFS BY MOUTH EVERY 4 HOURS AS NEEDED FOR WHEEZING 18 g 5  . atenolol (TENORMIN) 50 MG tablet Take 1 tablet (50 mg total) daily. 90 tablet 1  . cefdinir (OMNICEF) 300 MG capsule Take 1 capsule (300 mg total) by mouth 2 (two) times daily. 20 capsule 0  . chlorpheniramine-HYDROcodone (TUSSIONEX PENNKINETIC ER) 10-8 MG/5ML SUER Take 5 mLs by mouth every 12 (twelve) hours as needed for cough. 100 mL 0  . citalopram (CELEXA) 20 MG tablet TAKE 1 TABLET(20 MG) BY MOUTH DAILY 90 tablet 1  . clonazePAM (KLONOPIN) 0.5 MG tablet TAKE 1 TO 1 AND 1/2 TABLETS BY MOUTH EVERY 8 TO 12 HOURS AS NEEDED 30 tablet 0  . HYDROcodone-acetaminophen (NORCO) 10-325 MG per tablet Take 1 tablet by mouth 5 (five) times daily.     . mometasone-formoterol (DULERA) 200-5 MCG/ACT AERO Inhale 2 puffs into the lungs 2 (two) times daily. 1 Inhaler 11  . naloxone (NARCAN) nasal spray 4 mg/0.1 mL Place 1 spray into the nose once as needed (overdose).    . predniSONE (DELTASONE) 20 MG tablet Take 2 tablets (40 mg total) by mouth daily with breakfast. 10 tablet 0  . rosuvastatin (CRESTOR) 5 MG tablet Take 1 tablet (5 mg total) by mouth daily. 30 tablet 5   No current facility-administered medications on file prior to visit.     Past Medical History:  Diagnosis Date  . Allergy  rhinitis, seasonal  . Anxiety   . Asthma    extrinsic  . GERD (gastroesophageal reflux disease)   . Hemorrhoids   . Hx of colonic polyps 2010   hyperplastic  . Hyperglycemia    borderline  . Hyperlipidemia    borderline  . Tachycardia, paroxysmal ([place])    Dr Caryl Comes    Past Surgical History:  Procedure Laterality Date  . ABDOMINAL HYSTERECTOMY  2004   For Abnormal Pap & painful menses, Dr Deatra Ina  . COLONOSCOPY W/ POLYPECTOMY  11/2008   Dr Oletta Lamas  . ESI     X 2; Dr Nelva Bush  . UPPER GI ENDOSCOPY  04/2012   esophageal erosions ; Dr Oletta Lamas  . WISDOM TOOTH EXTRACTION  02/13/2009     Social History   Socioeconomic History  . Marital status: Married    Spouse name: Not on file  . Number of children: Not on file  . Years of education: Not on file  . Highest education level: Not on file  Occupational History  . Not on file  Social Needs  . Financial resource strain: Not on file  . Food insecurity:    Worry: Not on file    Inability: Not on file  . Transportation needs:    Medical: Not on file    Non-medical: Not on file  Tobacco Use  . Smoking status: Former Smoker    Types: Cigarettes    Last attempt to quit: 12/25/2011    Years since quitting: 6.2  . Smokeless tobacco: Never Used  . Tobacco comment:  smoked 1982-2013,less than 1 pack a day   Substance and Sexual Activity  . Alcohol use: Yes    Comment: Rarely  . Drug use: No  . Sexual activity: Yes    Birth control/protection: None  Lifestyle  . Physical activity:    Days per week: Not on file    Minutes per session: Not on file  . Stress: Not on file  Relationships  . Social connections:    Talks on phone: Not on file    Gets together: Not on file    Attends religious service: Not on file    Active member of club or organization: Not on file    Attends meetings of clubs or organizations: Not on file    Relationship status: Not on file  Other Topics Concern  . Not on file  Social History Narrative   Regular exercise- no    Family History  Problem Relation Age of Onset  . Colon cancer Father        Stage 4  . Diabetes Father   . Coronary artery disease Mother        angioplasty in late 13s  . Hyperlipidemia Mother   . Depression Brother   . Bipolar disorder Brother   . Diabetes Brother   . Colon cancer Paternal Uncle   . Lung cancer Paternal Grandfather   . Colon cancer Paternal Grandfather   . Breast cancer Other        MGAunts  . Stroke Neg Hx     Review of Systems  Constitutional: Negative for fever (none since 5 days ago).  HENT: Positive for sinus pressure. Negative  for ear pain and sore throat.   Respiratory: Positive for cough, chest tightness, shortness of breath and wheezing.   Genitourinary:       Vulvar itching, irritation       Objective:   Vitals:   03/25/18 1039  BP: Marland Kitchen)  142/80  Pulse: 68  Resp: 18  Temp: 98.5 F (36.9 C)  SpO2: 97%   BP Readings from Last 3 Encounters:  03/25/18 (!) 142/80  03/15/18 (!) 144/88  09/04/17 132/72   Wt Readings from Last 3 Encounters:  03/25/18 226 lb (102.5 kg)  03/15/18 226 lb 12.8 oz (102.9 kg)  09/04/17 218 lb (98.9 kg)   Body mass index is 38.79 kg/m.   Physical Exam    GENERAL APPEARANCE: Appears stated age, well appearing, NAD EYES: conjunctiva clear, no icterus HEENT: bilateral tympanic membranes and ear canals normal, oropharynx with no erythema, no thyromegaly, trachea midline, no cervical or supraclavicular lymphadenopathy LUNGS:  unlabored breathing, good air entry bilaterally, few scattered fine wheezing at the end of expiration, no crackles CARDIOVASCULAR: Normal S1,S2 without murmurs, no edema SKIN: Warm, dry      Assessment & Plan:    See Problem List for Assessment and Plan of chronic medical problems.

## 2018-03-24 NOTE — Telephone Encounter (Signed)
Can she come in tomorrow so I can listen to her lungs again and figure out if we need more steroids or change in antibiotic?

## 2018-03-24 NOTE — Telephone Encounter (Signed)
Still having wheezing, still has a bad cough, feels congestion but it is not as productive as she wants it to be. Using dulera BID. No fever anymore. SOB. Finished zpak. Still has a few more days of omnicef left. Has nasal congestion. Has been taking prednisone. Not sure if it is helping. Not getting worse but does not think she is much better than when she saw you. Please advise.

## 2018-03-25 ENCOUNTER — Ambulatory Visit: Payer: BLUE CROSS/BLUE SHIELD | Admitting: Internal Medicine

## 2018-03-25 ENCOUNTER — Encounter: Payer: Self-pay | Admitting: Internal Medicine

## 2018-03-25 VITALS — BP 142/80 | HR 68 | Temp 98.5°F | Resp 18 | Ht 64.0 in | Wt 226.0 lb

## 2018-03-25 DIAGNOSIS — B373 Candidiasis of vulva and vagina: Secondary | ICD-10-CM

## 2018-03-25 DIAGNOSIS — J4541 Moderate persistent asthma with (acute) exacerbation: Secondary | ICD-10-CM

## 2018-03-25 DIAGNOSIS — J209 Acute bronchitis, unspecified: Secondary | ICD-10-CM | POA: Diagnosis not present

## 2018-03-25 DIAGNOSIS — B3731 Acute candidiasis of vulva and vagina: Secondary | ICD-10-CM

## 2018-03-25 MED ORDER — PREDNISONE 10 MG PO TABS: ORAL_TABLET | ORAL | 0 refills | Status: DC

## 2018-03-25 MED ORDER — FLUCONAZOLE 150 MG PO TABS: 150.0000 mg | ORAL_TABLET | Freq: Once | ORAL | 0 refills | Status: AC

## 2018-03-25 MED ORDER — METHYLPREDNISOLONE ACETATE 80 MG/ML IJ SUSP
80.0000 mg | Freq: Once | INTRAMUSCULAR | Status: AC
  Administered 2018-03-25: 80 mg via INTRAMUSCULAR

## 2018-03-25 NOTE — Assessment & Plan Note (Signed)
He is infection secondary to antibiotics and steroids Diflucan

## 2018-03-25 NOTE — Assessment & Plan Note (Signed)
I think her infection at this point is successfully treated Completed Z-Pak and is about to complete Omnicef No additional antibiotics needed

## 2018-03-25 NOTE — Patient Instructions (Addendum)
You had a steroid injection  Take the prednisone taper.  Use the diflucan for the yeast infection.   Use your neb treatments twice a day for now.

## 2018-03-25 NOTE — Assessment & Plan Note (Signed)
Still experiencing symptoms consistent with asthma exacerbation-related to acute bronchitis, which has been successfully treated No additional antibiotics needed Depo-Medrol 80 mg IM x1 today Prednisone taper 40 mg x 3 days-taper down Continue maintenance inhaler twice daily Advise doing nebulizer treatments twice daily, albuterol as needed Over-the-counter cold medications, cough syrup as needed

## 2018-04-05 ENCOUNTER — Other Ambulatory Visit: Payer: Self-pay | Admitting: Internal Medicine

## 2018-04-05 ENCOUNTER — Other Ambulatory Visit: Payer: Self-pay

## 2018-04-05 ENCOUNTER — Encounter: Payer: Self-pay | Admitting: Internal Medicine

## 2018-04-05 DIAGNOSIS — F411 Generalized anxiety disorder: Secondary | ICD-10-CM

## 2018-04-05 MED ORDER — ROSUVASTATIN CALCIUM 5 MG PO TABS: 5.0000 mg | ORAL_TABLET | Freq: Every day | ORAL | 1 refills | Status: DC

## 2018-04-05 MED ORDER — CLONAZEPAM 0.5 MG PO TABS: ORAL_TABLET | ORAL | 0 refills | Status: DC

## 2018-04-05 NOTE — Telephone Encounter (Signed)
Last OV 03/15/18 Next OV 09/14/18 Last refill 01/05/18

## 2018-04-23 ENCOUNTER — Encounter: Payer: Self-pay | Admitting: Internal Medicine

## 2018-04-23 DIAGNOSIS — M5030 Other cervical disc degeneration, unspecified cervical region: Secondary | ICD-10-CM | POA: Diagnosis not present

## 2018-04-23 DIAGNOSIS — G894 Chronic pain syndrome: Secondary | ICD-10-CM | POA: Diagnosis not present

## 2018-08-26 ENCOUNTER — Other Ambulatory Visit: Payer: Self-pay | Admitting: Internal Medicine

## 2018-09-02 DIAGNOSIS — G894 Chronic pain syndrome: Secondary | ICD-10-CM | POA: Diagnosis not present

## 2018-09-02 DIAGNOSIS — Z79899 Other long term (current) drug therapy: Secondary | ICD-10-CM | POA: Diagnosis not present

## 2018-09-13 NOTE — Progress Notes (Deleted)
Subjective:    Patient ID: Amanda [name], female    DOB: 06/03/1965, 53 y.o.   MRN: [id number]  HPI The patient is here for follow up.  She is exercising regularly.     Prediabetes:  She is compliant with a low sugar/carbohydrate diet.  She is exercising regularly.  GERD:  She is taking her medication daily as prescribed.  She denies any GERD symptoms and feels her GERD is well controlled.   Hyperlipidemia: She is taking her medication daily. She is compliant with a low fat/cholesterol diet. She denies myalgias.   Anxiety: She is taking her medication daily as prescribed. She denies any side effects from the medication. She feels her anxiety is well controlled and she is happy with her current dose of medication.   Asthma:    Medications and allergies reviewed with patient and updated if appropriate.  Patient Active Problem List   Diagnosis Date Noted  . Vaginal candidiasis 03/25/2018  . Asthma exacerbation 03/15/2018  . Acute bronchitis 03/15/2018  . Chest pain 01/22/2017  . Prediabetes 12/03/2016  . Anxiety state 05/26/2014  . DDD (degenerative disc disease), cervical 12/16/2012  . GERD (gastroesophageal reflux disease) 11/08/2012  . H/O ventricular tachycardia 11/08/2012  . RBBB 03/29/2012  . HERPES ZOSTER 02/11/2010  . COLONIC POLYPS, HX OF 02/04/2010  . OVARIAN CYST, LEFT 11/17/2008  . Mixed hyperlipidemia 09/27/2008  . ALLERGIC RHINITIS 09/27/2008  . Asthma 09/27/2008    Current Outpatient Medications on File Prior to Visit  Medication Sig Dispense Refill  . albuterol (PROVENTIL HFA;VENTOLIN HFA) 108 (90 Base) MCG/ACT inhaler INHALE 2 PUFFS BY MOUTH EVERY 4 HOURS AS NEEDED FOR WHEEZING 18 g 5  . atenolol (TENORMIN) 50 MG tablet TAKE 1 TABLET(50 MG) BY MOUTH DAILY 90 tablet 0  . cefdinir (OMNICEF) 300 MG capsule Take 1 capsule (300 mg total) by mouth 2 (two) times daily. 20 capsule 0  . chlorpheniramine-HYDROcodone (TUSSIONEX PENNKINETIC ER) 10-8 MG/5ML SUER Take  5 mLs by mouth every 12 (twelve) hours as needed for cough. 100 mL 0  . citalopram (CELEXA) 20 MG tablet TAKE 1 TABLET(20 MG) BY MOUTH DAILY 90 tablet 1  . clonazePAM (KLONOPIN) 0.5 MG tablet TAKE 1 TO 1 AND 1/2 TABLETS BY MOUTH EVERY 8 TO 12 HOURS AS NEEDED 30 tablet 0  . HYDROcodone-acetaminophen (NORCO) 10-325 MG per tablet Take 1 tablet by mouth 5 (five) times daily.     . mometasone-formoterol (DULERA) 200-5 MCG/ACT AERO Inhale 2 puffs into the lungs 2 (two) times daily. 1 Inhaler 11  . naloxone (NARCAN) nasal spray 4 mg/0.1 mL Place 1 spray into the nose once as needed (overdose).    . predniSONE (DELTASONE) 10 MG tablet Take 4 tabs po qd x 3 days, then 3 tabs po qd x 3 days, then 2 tabs po qd x 3 days, then 1 tab po qd x 3 days 30 tablet 0  . rosuvastatin (CRESTOR) 5 MG tablet Take 1 tablet (5 mg total) by mouth daily. 90 tablet 1   No current facility-administered medications on file prior to visit.     Past Medical History:  Diagnosis Date  . Allergy    rhinitis, seasonal  . Anxiety   . Asthma    extrinsic  . GERD (gastroesophageal reflux disease)   . Hemorrhoids   . Hx of colonic polyps 2010   hyperplastic  . Hyperglycemia    borderline  . Hyperlipidemia    borderline  . Tachycardia, paroxysmal ([place])  Dr Caryl Comes    Past Surgical History:  Procedure Laterality Date  . ABDOMINAL HYSTERECTOMY  2004   For Abnormal Pap & painful menses, Dr Deatra Ina  . COLONOSCOPY W/ POLYPECTOMY  11/2008   Dr Oletta Lamas  . ESI     X 2; Dr Nelva Bush  . UPPER GI ENDOSCOPY  04/2012   esophageal erosions ; Dr Oletta Lamas  . WISDOM TOOTH EXTRACTION  02/13/2009    Social History   Socioeconomic History  . Marital status: Married    Spouse name: Not on file  . Number of children: Not on file  . Years of education: Not on file  . Highest education level: Not on file  Occupational History  . Not on file  Social Needs  . Financial resource strain: Not on file  . Food insecurity    Worry: Not on  file    Inability: Not on file  . Transportation needs    Medical: Not on file    Non-medical: Not on file  Tobacco Use  . Smoking status: Former Smoker    Types: Cigarettes    Quit date: 12/25/2011    Years since quitting: 6.7  . Smokeless tobacco: Never Used  . Tobacco comment:  smoked 1982-2013,less than 1 pack a day   Substance and Sexual Activity  . Alcohol use: Yes    Comment: Rarely  . Drug use: No  . Sexual activity: Yes    Birth control/protection: None  Lifestyle  . Physical activity    Days per week: Not on file    Minutes per session: Not on file  . Stress: Not on file  Relationships  . Social Herbalist on phone: Not on file    Gets together: Not on file    Attends religious service: Not on file    Active member of club or organization: Not on file    Attends meetings of clubs or organizations: Not on file    Relationship status: Not on file  Other Topics Concern  . Not on file  Social History Narrative   Regular exercise- no    Family History  Problem Relation Age of Onset  . Colon cancer Father        Stage 4  . Diabetes Father   . Coronary artery disease Mother        angioplasty in late 49s  . Hyperlipidemia Mother   . Depression Brother   . Bipolar disorder Brother   . Diabetes Brother   . Colon cancer Paternal Uncle   . Lung cancer Paternal Grandfather   . Colon cancer Paternal Grandfather   . Breast cancer Other        MGAunts  . Stroke Neg Hx     Review of Systems     Objective:  There were no vitals filed for this visit. BP Readings from Last 3 Encounters:  03/25/18 (!) 142/80  03/15/18 (!) 144/88  09/04/17 132/72   Wt Readings from Last 3 Encounters:  03/25/18 226 lb (102.5 kg)  03/15/18 226 lb 12.8 oz (102.9 kg)  09/04/17 218 lb (98.9 kg)   There is no height or weight on file to calculate BMI.   Physical Exam    Constitutional: Appears well-developed and well-nourished. No distress.  HENT:  Head:  Normocephalic and atraumatic.  Neck: Neck supple. No tracheal deviation present. No thyromegaly present.  No cervical lymphadenopathy Cardiovascular: Normal rate, regular rhythm and normal heart sounds.   No murmur heard. No  carotid bruit .  No edema Pulmonary/Chest: Effort normal and breath sounds normal. No respiratory distress. No has no wheezes. No rales.  Skin: Skin is warm and dry. Not diaphoretic.  Psychiatric: Normal mood and affect. Behavior is normal.      Assessment & Plan:    See Problem List for Assessment and Plan of chronic medical problems.

## 2018-09-14 ENCOUNTER — Ambulatory Visit: Payer: BLUE CROSS/BLUE SHIELD | Admitting: Internal Medicine

## 2018-09-14 DIAGNOSIS — Z0289 Encounter for other administrative examinations: Secondary | ICD-10-CM

## 2018-10-08 ENCOUNTER — Other Ambulatory Visit: Payer: Self-pay | Admitting: Internal Medicine

## 2018-10-08 DIAGNOSIS — F411 Generalized anxiety disorder: Secondary | ICD-10-CM

## 2018-10-08 NOTE — Telephone Encounter (Signed)
Last OV was 03/15/18 Next OV NA Last RF 04/05/18

## 2018-11-11 ENCOUNTER — Other Ambulatory Visit: Payer: Self-pay | Admitting: Internal Medicine

## 2018-11-22 ENCOUNTER — Other Ambulatory Visit: Payer: Self-pay | Admitting: Internal Medicine

## 2018-12-29 ENCOUNTER — Other Ambulatory Visit: Payer: Self-pay | Admitting: Internal Medicine

## 2018-12-30 ENCOUNTER — Ambulatory Visit (INDEPENDENT_AMBULATORY_CARE_PROVIDER_SITE_OTHER): Payer: BC Managed Care – PPO

## 2018-12-30 ENCOUNTER — Other Ambulatory Visit: Payer: Self-pay

## 2018-12-30 DIAGNOSIS — Z23 Encounter for immunization: Secondary | ICD-10-CM

## 2018-12-30 DIAGNOSIS — G894 Chronic pain syndrome: Secondary | ICD-10-CM | POA: Diagnosis not present

## 2018-12-31 ENCOUNTER — Encounter: Payer: Self-pay | Admitting: Internal Medicine

## 2019-01-07 IMAGING — CR DG CHEST 2V
2 series · 2 of 2 positions shown · non-contrast
Comparison: 02/20/2009

CLINICAL DATA: Chest pain

EXAM:
CHEST  2 VIEW

[chest pa]
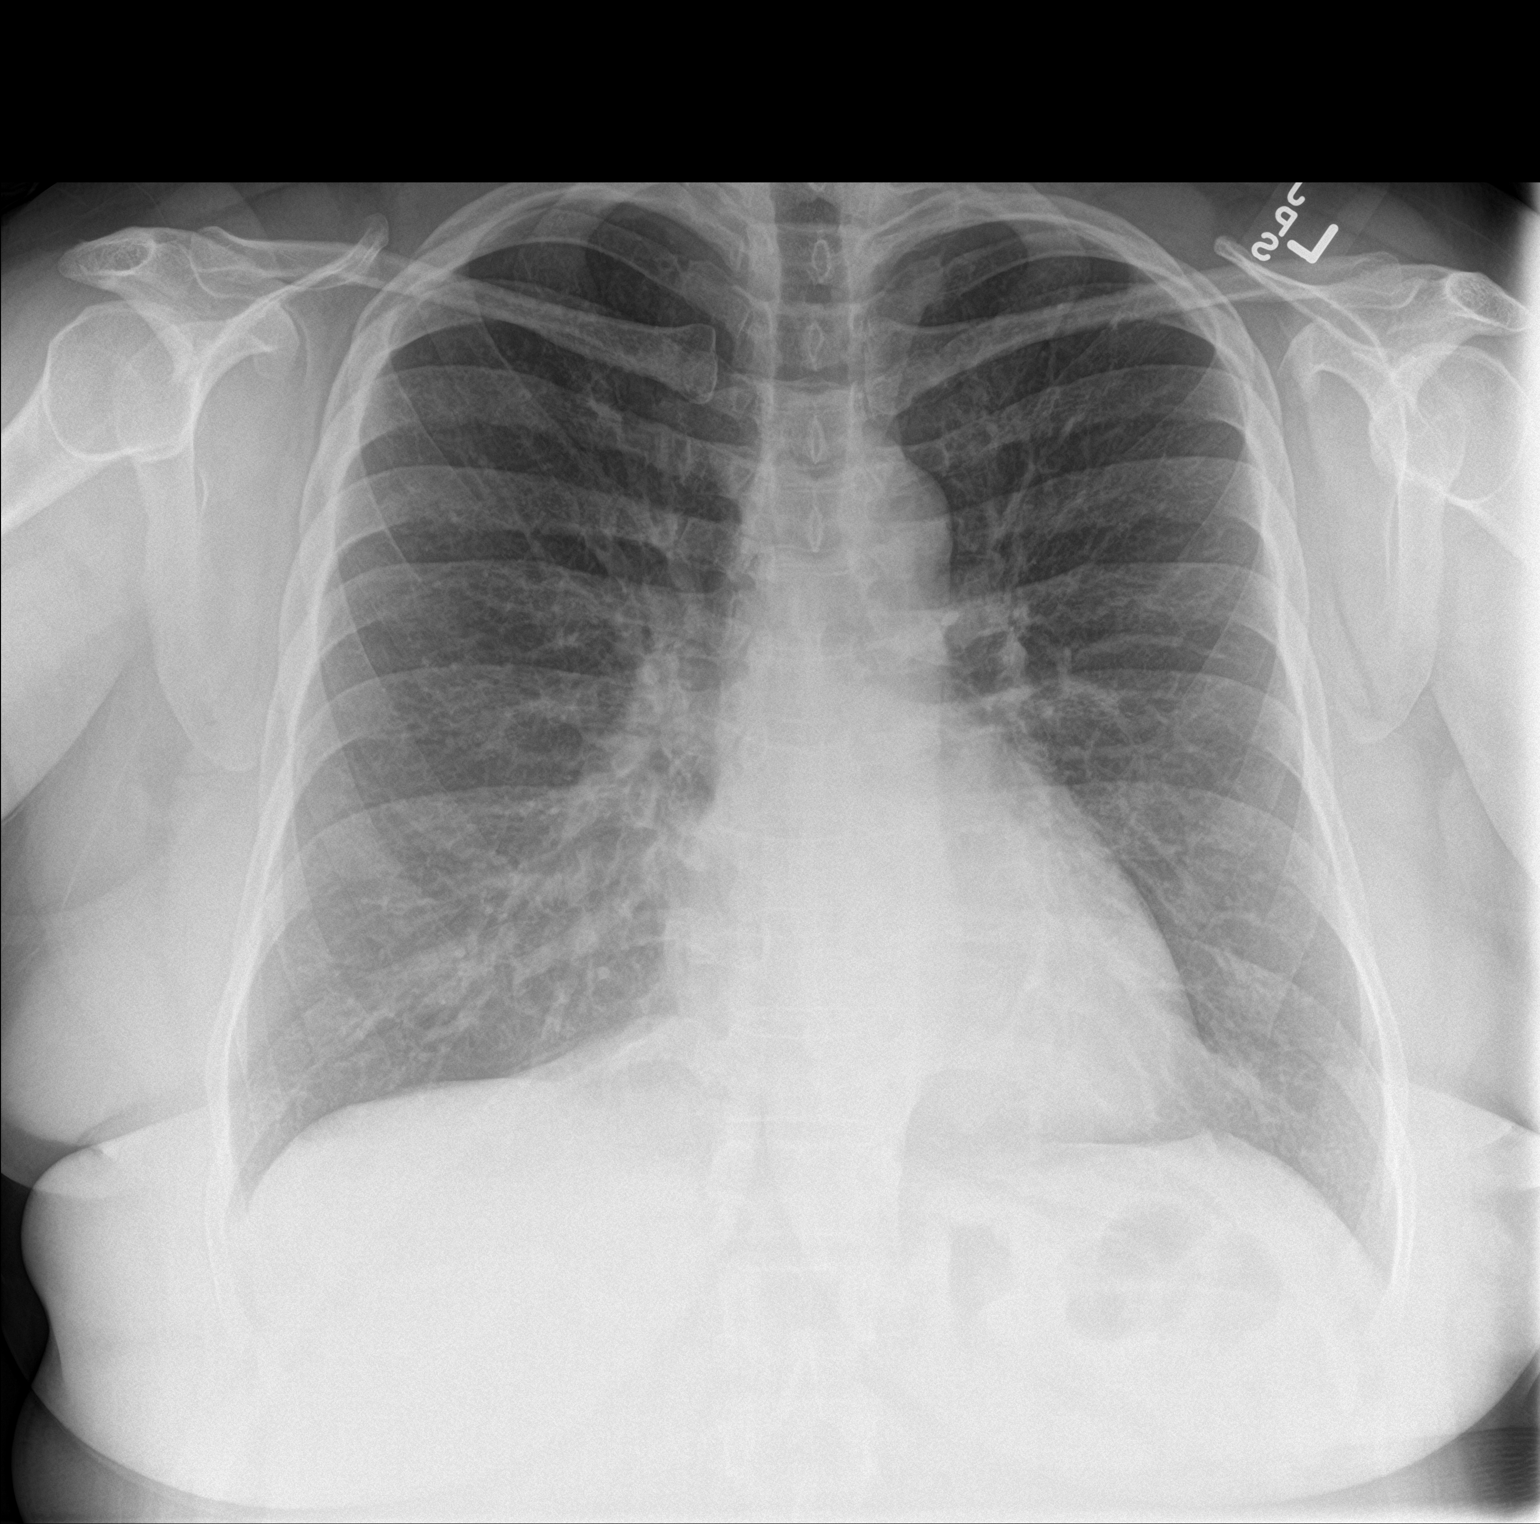

[chest lat]
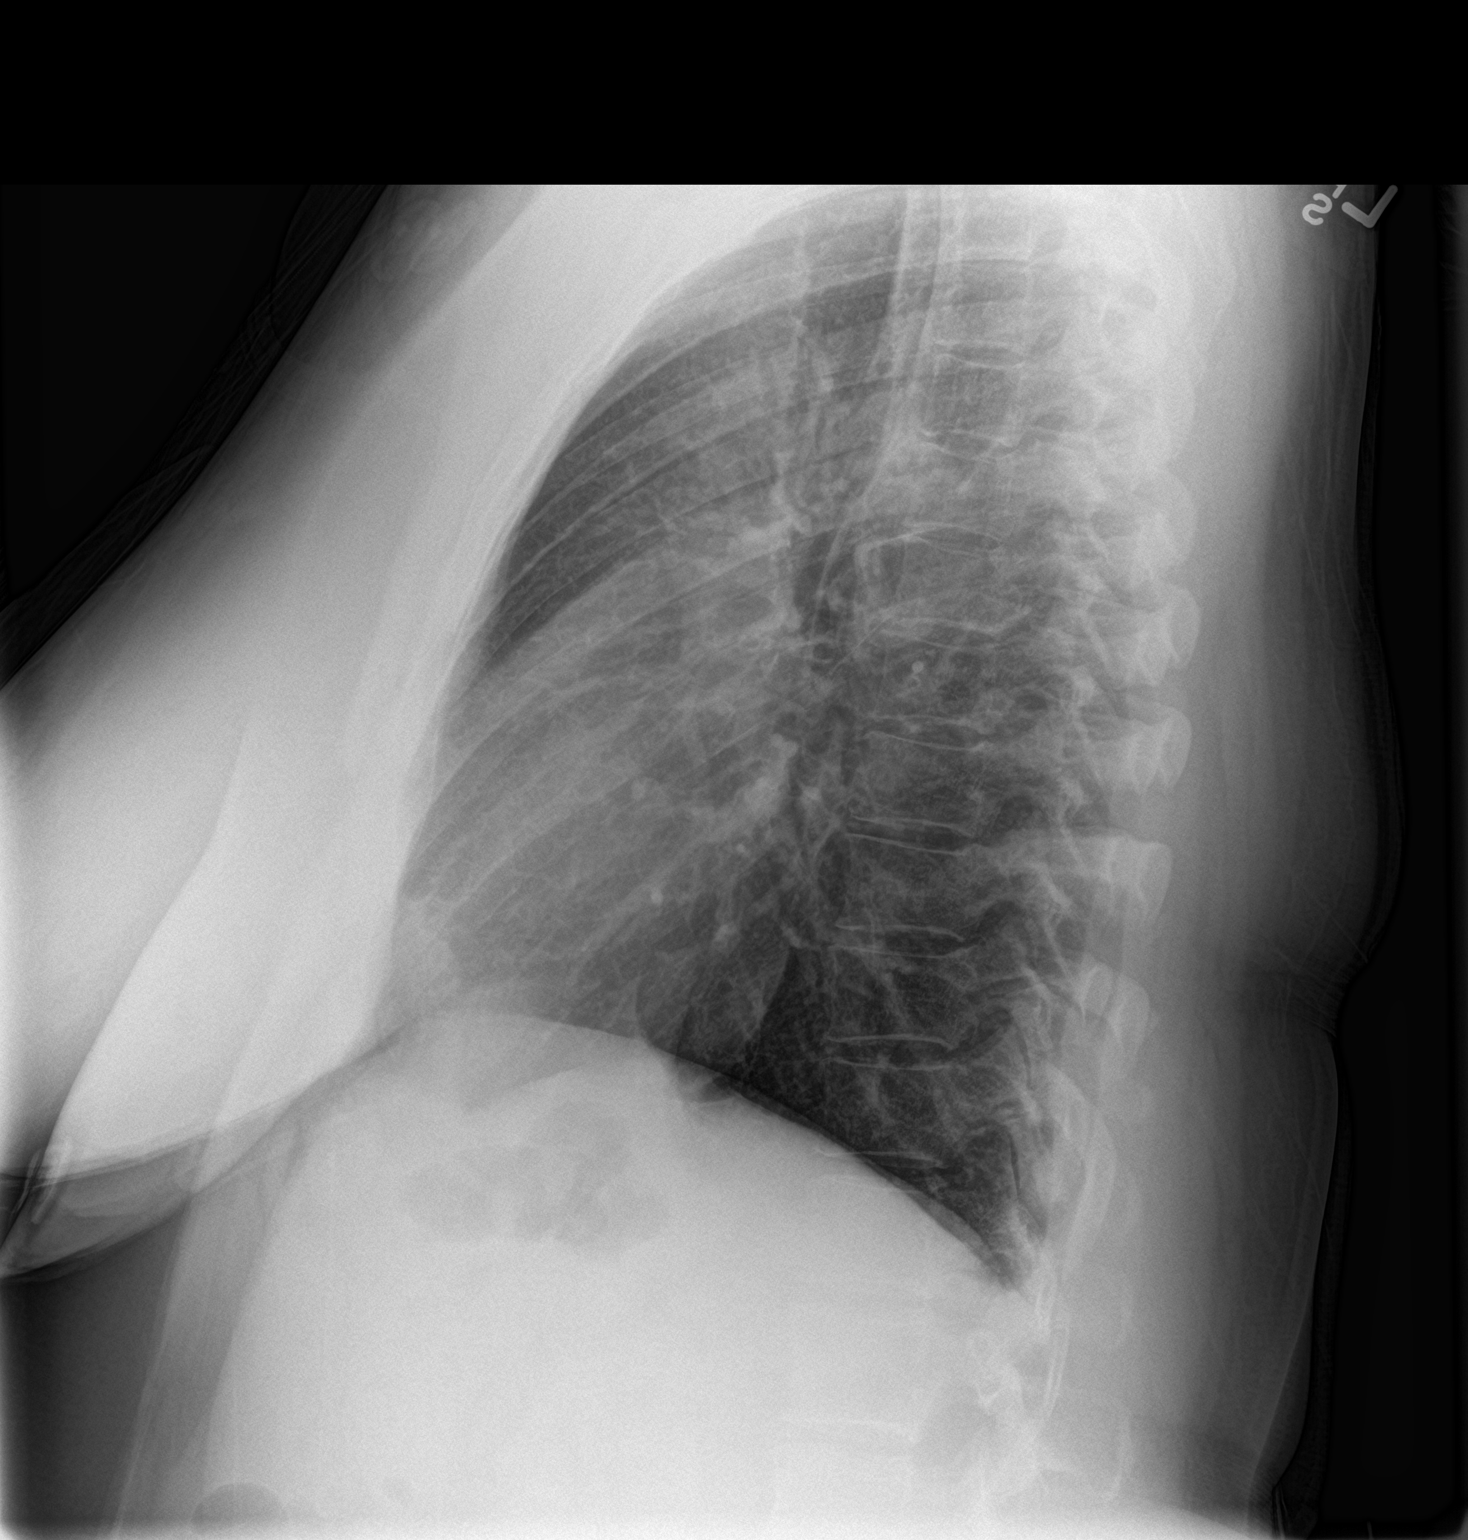

[2 of 2 positions shown; findings below may reference images not displayed]

FINDINGS: Mild hyperinflation. Heart and mediastinal contours are within
normal limits. No focal opacities or effusions. No acute bony
abnormality.
IMPRESSION: No active cardiopulmonary disease.

## 2019-01-19 ENCOUNTER — Other Ambulatory Visit: Payer: Self-pay | Admitting: Internal Medicine

## 2019-01-19 ENCOUNTER — Encounter: Payer: Self-pay | Admitting: Internal Medicine

## 2019-01-19 DIAGNOSIS — F411 Generalized anxiety disorder: Secondary | ICD-10-CM

## 2019-01-19 DIAGNOSIS — J4520 Mild intermittent asthma, uncomplicated: Secondary | ICD-10-CM

## 2019-01-19 MED ORDER — MOMETASONE FURO-FORMOTEROL FUM 200-5 MCG/ACT IN AERO: 2.0000 | INHALATION_SPRAY | Freq: Two times a day (BID) | RESPIRATORY_TRACT | 1 refills | Status: DC

## 2019-01-24 ENCOUNTER — Other Ambulatory Visit: Payer: Self-pay | Admitting: Internal Medicine

## 2019-02-22 ENCOUNTER — Other Ambulatory Visit: Payer: Self-pay | Admitting: Internal Medicine

## 2019-02-28 NOTE — Patient Instructions (Addendum)
Call and schedule your mammogram -  [facility] 7 a.m.-6:30 p.m., Monday 7 a.m.-5 p.m., Tuesday-Friday Schedule an appointment by calling [phone]     Blood work was ordered.     Medications reviewed and updated.  Changes include :   Start Augmentin for your two infections  Your prescription(s) have been submitted to your pharmacy. Please take as directed and contact our office if you believe you are having problem(s) with the medication(s).     Please followup in 6 months

## 2019-02-28 NOTE — Progress Notes (Signed)
Subjective:    Patient ID: Amanda [name], female    DOB: 1965/04/20, 55 y.o.   MRN: [id number]  HPI The patient is here for follow up of their chronic medical problems, including asthma, prediabetes, hyperlipidemia, anxiety.   She is taking all of her medications as prescribed.   She is not exercising regularly.      Boil on left buttock region:  It has been there about one month.  It is not painful now, but was initially.  It is still bleeding.  Her husband and daughter both tried to lance it and it drained.    Swelling in jaw:  She needs dental work.  She has a broken bridge and her right face has been painful and swollen.  She has gargled with salt water and that has helped.  She is trying to get the right pricing, but has her dentist and an oral surgeon lined up.  She thinks she needs to be on an antibiotic.     Medications and allergies reviewed with patient and updated if appropriate.  Patient Active Problem List   Diagnosis Date Noted  . Chest pain 01/22/2017  . Prediabetes 12/03/2016  . Anxiety state 05/26/2014  . DDD (degenerative disc disease), cervical 12/16/2012  . GERD (gastroesophageal reflux disease) 11/08/2012  . H/O ventricular tachycardia 11/08/2012  . RBBB 03/29/2012  . HERPES ZOSTER 02/11/2010  . COLONIC POLYPS, HX OF 02/04/2010  . OVARIAN CYST, LEFT 11/17/2008  . Mixed hyperlipidemia 09/27/2008  . ALLERGIC RHINITIS 09/27/2008  . Asthma 09/27/2008    Current Outpatient Medications on File Prior to Visit  Medication Sig Dispense Refill  . clonazePAM (KLONOPIN) 0.5 MG tablet TAKE 1 TO 1 AND 1/2 TABLETS BY MOUTH EVERY 8 TO 12 HOURS AS NEEDED 30 tablet 0  . HYDROcodone-acetaminophen (NORCO) 10-325 MG per tablet Take 1 tablet by mouth 5 (five) times daily.     . mometasone-formoterol (DULERA) 200-5 MCG/ACT AERO Inhale 2 puffs into the lungs 2 (two) times daily. 39 g 1  . naloxone (NARCAN) nasal spray 4 mg/0.1 mL Place 1 spray into the nose once as needed  (overdose).     No current facility-administered medications on file prior to visit.    Past Medical History:  Diagnosis Date  . Allergy    rhinitis, seasonal  . Anxiety   . Asthma    extrinsic  . GERD (gastroesophageal reflux disease)   . Hemorrhoids   . Hx of colonic polyps 2010   hyperplastic  . Hyperglycemia    borderline  . Hyperlipidemia    borderline  . Tachycardia, paroxysmal ([place])    Dr Caryl Comes    Past Surgical History:  Procedure Laterality Date  . ABDOMINAL HYSTERECTOMY  2004   For Abnormal Pap & painful menses, Dr Deatra Ina  . COLONOSCOPY W/ POLYPECTOMY  11/2008   Dr Oletta Lamas  . ESI     X 2; Dr Nelva Bush  . UPPER GI ENDOSCOPY  04/2012   esophageal erosions ; Dr Oletta Lamas  . WISDOM TOOTH EXTRACTION  02/13/2009    Social History   Socioeconomic History  . Marital status: Married    Spouse name: Not on file  . Number of children: Not on file  . Years of education: Not on file  . Highest education level: Not on file  Occupational History  . Not on file  Tobacco Use  . Smoking status: Former Smoker    Types: Cigarettes    Quit date: 12/25/2011  Years since quitting: 7.1  . Smokeless tobacco: Never Used  . Tobacco comment:  smoked 1982-2013,less than 1 pack a day   Substance and Sexual Activity  . Alcohol use: Yes    Comment: Rarely  . Drug use: No  . Sexual activity: Yes    Birth control/protection: None  Other Topics Concern  . Not on file  Social History Narrative   Regular exercise- no   Social Determinants of Health   Financial Resource Strain:   . Difficulty of Paying Living Expenses: Not on file  Food Insecurity:   . Worried About Charity fundraiser in the Last Year: Not on file  . Ran Out of Food in the Last Year: Not on file  Transportation Needs:   . Lack of Transportation (Medical): Not on file  . Lack of Transportation (Non-Medical): Not on file  Physical Activity:   . Days of Exercise per Week: Not on file  . Minutes of Exercise per  Session: Not on file  Stress:   . Feeling of Stress : Not on file  Social Connections:   . Frequency of Communication with Friends and Family: Not on file  . Frequency of Social Gatherings with Friends and Family: Not on file  . Attends Religious Services: Not on file  . Active Member of Clubs or Organizations: Not on file  . Attends Archivist Meetings: Not on file  . Marital Status: Not on file    Family History  Problem Relation Age of Onset  . Colon cancer Father        Stage 4  . Diabetes Father   . Coronary artery disease Mother        angioplasty in late 59s  . Hyperlipidemia Mother   . Depression Brother   . Bipolar disorder Brother   . Diabetes Brother   . Colon cancer Paternal Uncle   . Lung cancer Paternal Grandfather   . Colon cancer Paternal Grandfather   . Breast cancer Other        MGAunts  . Stroke Neg Hx     Review of Systems  Constitutional: Negative for chills and fever.  HENT: Positive for facial swelling (along right nose and cheek).   Respiratory: Positive for shortness of breath (with exertion). Negative for cough and wheezing.   Cardiovascular: Positive for chest pain (with panic attacks) and palpitations (with panic attacks). Negative for leg swelling.  Neurological: Negative for dizziness, light-headedness and headaches.  Psychiatric/Behavioral: Positive for dysphoric mood (related to COVID pandemic). The patient is nervous/anxious.        Objective:   Vitals:   03/01/19 0846  BP: (!) 146/88  Pulse: 83  Resp: 16  Temp: 99 F (37.2 C)  SpO2: 96%   BP Readings from Last 3 Encounters:  03/01/19 (!) 146/88  03/25/18 (!) 142/80  03/15/18 (!) 144/88   Wt Readings from Last 3 Encounters:  03/01/19 237 lb 12.8 oz (107.9 kg)  03/25/18 226 lb (102.5 kg)  03/15/18 226 lb 12.8 oz (102.9 kg)   Body mass index is 40.82 kg/m.   Physical Exam    Constitutional: Appears well-developed and well-nourished. No distress.  HENT:    Head: Normocephalic and atraumatic.  Face, Mouth: minimal facial swelling right cheek and side of nose, no obvious upper gum swelling or discharge - right upper incisor  Neck: Neck supple. No tracheal deviation present. No thyromegaly present.  No cervical lymphadenopathy Cardiovascular: Normal rate, regular rhythm and normal heart sounds.  No murmur heard. No carotid bruit .  No edema Pulmonary/Chest: Effort normal and breath sounds normal. No respiratory distress. No has no wheezes. No rales.  Skin: Skin is warm and dry. Not diaphoretic. Indurated area on central left buttock the size of a walnut - nontender, ulcerated central region size of a pencil eraser with minimal bleeding.  Very small amount of pus extracted with pressure Psychiatric: Normal mood and affect. Behavior is normal.      Assessment & Plan:    See Problem List for Assessment and Plan of chronic medical problems.    This visit occurred during the SARS-CoV-2 public health emergency.  Safety protocols were in place, including screening questions prior to the visit, additional usage of staff PPE, and extensive cleaning of exam room while observing appropriate contact time as indicated for disinfecting solutions.

## 2019-03-01 ENCOUNTER — Encounter: Payer: Self-pay | Admitting: Internal Medicine

## 2019-03-01 ENCOUNTER — Ambulatory Visit: Payer: BC Managed Care – PPO | Admitting: Internal Medicine

## 2019-03-01 ENCOUNTER — Other Ambulatory Visit: Payer: Self-pay

## 2019-03-01 VITALS — BP 146/88 | HR 83 | Temp 99.0°F | Resp 16 | Ht 64.0 in | Wt 237.8 lb

## 2019-03-01 DIAGNOSIS — F411 Generalized anxiety disorder: Secondary | ICD-10-CM

## 2019-03-01 DIAGNOSIS — J4520 Mild intermittent asthma, uncomplicated: Secondary | ICD-10-CM

## 2019-03-01 DIAGNOSIS — E782 Mixed hyperlipidemia: Secondary | ICD-10-CM

## 2019-03-01 DIAGNOSIS — L0232 Furuncle of buttock: Secondary | ICD-10-CM | POA: Insufficient documentation

## 2019-03-01 DIAGNOSIS — R7303 Prediabetes: Secondary | ICD-10-CM

## 2019-03-01 DIAGNOSIS — Z1231 Encounter for screening mammogram for malignant neoplasm of breast: Secondary | ICD-10-CM

## 2019-03-01 DIAGNOSIS — K047 Periapical abscess without sinus: Secondary | ICD-10-CM | POA: Insufficient documentation

## 2019-03-01 LAB — COMPREHENSIVE METABOLIC PANEL
ALT: 20 U/L (ref 0–35)
AST: 18 U/L (ref 0–37)
Albumin: 4.3 g/dL (ref 3.5–5.2)
Alkaline Phosphatase: 76 U/L (ref 39–117)
BUN: 7 mg/dL (ref 6–23)
CO2: 31 mEq/L (ref 19–32)
Calcium: 9.2 mg/dL (ref 8.4–10.5)
Chloride: 102 mEq/L (ref 96–112)
Creatinine, Ser: 0.77 mg/dL (ref 0.40–1.20)
GFR: 78.04 mL/min (ref >60.00)
Glucose, Bld: 110 mg/dL — ABNORMAL HIGH (ref 70–99)
Potassium: 3.7 mEq/L (ref 3.5–5.1)
Sodium: 139 mEq/L (ref 135–145)
Total Bilirubin: 0.5 mg/dL (ref 0.2–1.2)
Total Protein: 7.4 g/dL (ref 6.0–8.3)

## 2019-03-01 LAB — CBC WITH DIFFERENTIAL/PLATELET
Basophils Absolute: 0.1 10*3/uL (ref 0.0–0.1)
Basophils Relative: 0.5 % (ref 0.0–3.0)
Eosinophils Absolute: 0.2 10*3/uL (ref 0.0–0.7)
Eosinophils Relative: 2 % (ref 0.0–5.0)
HCT: 42 % (ref 36.0–46.0)
Hemoglobin: 14.2 g/dL (ref 12.0–15.0)
Lymphocytes Relative: 12.1 % (ref 12.0–46.0)
Lymphs Abs: 1.2 10*3/uL (ref 0.7–4.0)
MCHC: 33.8 g/dL (ref 30.0–36.0)
MCV: 91.2 fl (ref 78.0–100.0)
Monocytes Absolute: 0.5 10*3/uL (ref 0.1–1.0)
Monocytes Relative: 5 % (ref 3.0–12.0)
Neutro Abs: 8.2 10*3/uL — ABNORMAL HIGH (ref 1.4–7.7)
Neutrophils Relative %: 80.4 % — ABNORMAL HIGH (ref 43.0–77.0)
Platelets: 341 10*3/uL (ref 150.0–400.0)
RBC: 4.6 Mil/uL (ref 3.87–5.11)
RDW: 13.9 % (ref 11.5–15.5)
WBC: 10.1 10*3/uL (ref 4.0–10.5)

## 2019-03-01 LAB — LIPID PANEL
Cholesterol: 160 mg/dL (ref 0–200)
HDL: 46.5 mg/dL (ref >39.00)
LDL Cholesterol: 89 mg/dL (ref 0–99)
NonHDL: 113.91
Total CHOL/HDL Ratio: 3
Triglycerides: 126 mg/dL (ref 0.0–149.0)
VLDL: 25.2 mg/dL (ref 0.0–40.0)

## 2019-03-01 LAB — HEMOGLOBIN A1C: Hgb A1c MFr Bld: 6.7 % — ABNORMAL HIGH (ref 4.6–6.5)

## 2019-03-01 MED ORDER — CITALOPRAM HYDROBROMIDE 20 MG PO TABS: ORAL_TABLET | ORAL | 1 refills | Status: DC

## 2019-03-01 MED ORDER — AMOXICILLIN-POT CLAVULANATE 875-125 MG PO TABS: 1.0000 | ORAL_TABLET | Freq: Two times a day (BID) | ORAL | 0 refills | Status: DC

## 2019-03-01 MED ORDER — ROSUVASTATIN CALCIUM 5 MG PO TABS: ORAL_TABLET | ORAL | 1 refills | Status: DC

## 2019-03-01 MED ORDER — ATENOLOL 50 MG PO TABS: ORAL_TABLET | ORAL | 1 refills | Status: DC

## 2019-03-01 MED ORDER — ALBUTEROL SULFATE HFA 108 (90 BASE) MCG/ACT IN AERS: INHALATION_SPRAY | RESPIRATORY_TRACT | 5 refills | Status: DC

## 2019-03-01 NOTE — Assessment & Plan Note (Signed)
acute Plans on seeing dentist/oral surgeon Likely has dental infection Will treat with augmentin bid x 10 days

## 2019-03-01 NOTE — Assessment & Plan Note (Addendum)
Chronic, controlled Mild intermittent Uses dulera daily Albuterol prn  Continue above

## 2019-03-01 NOTE — Assessment & Plan Note (Signed)
Chronic Check a1c Low sugar / carb diet Stressed regular exercise  

## 2019-03-01 NOTE — Assessment & Plan Note (Signed)
Chronic Controlled, stable Continue current dose of medication - celexa 20 mg daily, clonazepam prn

## 2019-03-01 NOTE — Assessment & Plan Note (Signed)
Chronic Check lipid panel  Continue daily statin Regular exercise and healthy diet encouraged  

## 2019-03-01 NOTE — Assessment & Plan Note (Signed)
Subacute - present for 2 months Was able to drain some, but still some pus present and indurated area No pain augmentin BID x 10 days Continue warm soaks, heat compresses

## 2019-03-02 ENCOUNTER — Encounter: Payer: Self-pay | Admitting: Internal Medicine

## 2019-03-07 ENCOUNTER — Encounter: Payer: Self-pay | Admitting: Internal Medicine

## 2019-03-07 MED ORDER — FLUCONAZOLE 150 MG PO TABS: 150.0000 mg | ORAL_TABLET | Freq: Once | ORAL | 0 refills | Status: DC

## 2019-03-24 ENCOUNTER — Ambulatory Visit: Payer: BC Managed Care – PPO | Attending: Internal Medicine

## 2019-03-24 ENCOUNTER — Other Ambulatory Visit: Payer: Self-pay | Admitting: Internal Medicine

## 2019-03-24 DIAGNOSIS — Z23 Encounter for immunization: Secondary | ICD-10-CM

## 2019-03-24 NOTE — Progress Notes (Signed)
   Covid-19 Vaccination Clinic  Name:  Amanda [name]    MRN: [id number] DOB: 05/22/1965  03/24/2019  [name] was observed post Covid-19 immunization for 15 minutes without incident. She was provided with Vaccine Information Sheet and instruction to access the V-Safe system.   [name] was instructed to call 911 with any severe reactions post vaccine: Marland Kitchen Difficulty breathing  . Swelling of face and throat  . A fast heartbeat  . A bad rash all over body  . Dizziness and weakness   Immunizations Administered    Name Date Dose VIS Date Route   Pfizer COVID-19 Vaccine 03/24/2019  5:54 PM 0.3 mL 12/31/2018 Intramuscular   Manufacturer: [place]   Lot: [id number]   [place]: [id number]

## 2019-04-14 ENCOUNTER — Ambulatory Visit: Payer: BC Managed Care – PPO | Attending: Internal Medicine

## 2019-04-14 DIAGNOSIS — Z23 Encounter for immunization: Secondary | ICD-10-CM

## 2019-04-14 NOTE — Progress Notes (Signed)
   Covid-19 Vaccination Clinic  Name:  Amanda [name]    MRN: [id number] DOB: 05/21/1965  04/14/2019  [name] was observed post Covid-19 immunization for 15 minutes without incident. She was provided with Vaccine Information Sheet and instruction to access the V-Safe system.   [name] was instructed to call 911 with any severe reactions post vaccine: Marland Kitchen Difficulty breathing  . Swelling of face and throat  . A fast heartbeat  . A bad rash all over body  . Dizziness and weakness   Immunizations Administered    Name Date Dose VIS Date Route   Pfizer COVID-19 Vaccine 04/14/2019  8:22 AM 0.3 mL 12/31/2018 Intramuscular   Manufacturer: [place]   Lot: [id number]   [place]: [id number]

## 2019-04-20 ENCOUNTER — Ambulatory Visit: Payer: BC Managed Care – PPO

## 2019-04-28 DIAGNOSIS — M542 Cervicalgia: Secondary | ICD-10-CM | POA: Diagnosis not present

## 2019-05-26 ENCOUNTER — Other Ambulatory Visit: Payer: Self-pay | Admitting: Internal Medicine

## 2019-05-26 DIAGNOSIS — F411 Generalized anxiety disorder: Secondary | ICD-10-CM

## 2019-05-26 NOTE — Telephone Encounter (Signed)
Last OV 03/01/19 Next OV 06/01/19 Last RF 01/19/19

## 2019-05-31 NOTE — Progress Notes (Signed)
Subjective:    Patient ID: Amanda [name], female    DOB: July 12, 1965, 54 y.o.   MRN: [id number]  HPI The patient is here for follow up of their chronic medical problems, including diabetes, h/o SVT, hyperlipidemia, anxiety  She is taking all of her medications as prescribed.   She is more active these past few days, but is not exercising regularly.    She has been limiting her sugars/carbs.    Medications and allergies reviewed with patient and updated if appropriate.  Patient Active Problem List   Diagnosis Date Noted  . Dental infection 03/01/2019  . Boil of buttock 03/01/2019  . Chest pain 01/22/2017  . Diabetes mellitus without complication ([place]) 99991111  . Anxiety state 05/26/2014  . DDD (degenerative disc disease), cervical 12/16/2012  . GERD (gastroesophageal reflux disease) 11/08/2012  . H/O ventricular tachycardia 11/08/2012  . RBBB 03/29/2012  . HERPES ZOSTER 02/11/2010  . COLONIC POLYPS, HX OF 02/04/2010  . OVARIAN CYST, LEFT 11/17/2008  . Mixed hyperlipidemia 09/27/2008  . ALLERGIC RHINITIS 09/27/2008  . Asthma 09/27/2008    Current Outpatient Medications on File Prior to Visit  Medication Sig Dispense Refill  . albuterol (VENTOLIN HFA) 108 (90 Base) MCG/ACT inhaler INHALE 2 PUFFS BY MOUTH EVERY 4 HOURS AS NEEDED FOR WHEEZING 18 g 5  . atenolol (TENORMIN) 50 MG tablet TAKE 1 TABLET BY MOUTH DAILY 90 tablet 0  . citalopram (CELEXA) 20 MG tablet TAKE 1 TABLET(20 MG) BY MOUTH DAILY 90 tablet 1  . clonazePAM (KLONOPIN) 0.5 MG tablet TAKE 1 TO 1 AND 1/2 TABLETS BY MOUTH EVERY 8 TO 12 HOURS AS NEEDED 30 tablet 0  . HYDROcodone-acetaminophen (NORCO) 10-325 MG per tablet Take 1 tablet by mouth 5 (five) times daily.     . mometasone-formoterol (DULERA) 200-5 MCG/ACT AERO Inhale 2 puffs into the lungs 2 (two) times daily. 39 g 1  . naloxone (NARCAN) nasal spray 4 mg/0.1 mL Place 1 spray into the nose once as needed (overdose).    . rosuvastatin (CRESTOR) 5 MG tablet  TAKE 1 TABLET BY MOUTH DAILY 90 tablet 0   No current facility-administered medications on file prior to visit.    Past Medical History:  Diagnosis Date  . Allergy    rhinitis, seasonal  . Anxiety   . Asthma    extrinsic  . GERD (gastroesophageal reflux disease)   . Hemorrhoids   . Hx of colonic polyps 2010   hyperplastic  . Hyperglycemia    borderline  . Hyperlipidemia    borderline  . Tachycardia, paroxysmal ([place])    Dr Caryl Comes    Past Surgical History:  Procedure Laterality Date  . ABDOMINAL HYSTERECTOMY  2004   For Abnormal Pap & painful menses, Dr Deatra Ina  . COLONOSCOPY W/ POLYPECTOMY  11/2008   Dr Oletta Lamas  . ESI     X 2; Dr Nelva Bush  . UPPER GI ENDOSCOPY  04/2012   esophageal erosions ; Dr Oletta Lamas  . WISDOM TOOTH EXTRACTION  02/13/2009    Social History   Socioeconomic History  . Marital status: Married    Spouse name: Not on file  . Number of children: Not on file  . Years of education: Not on file  . Highest education level: Not on file  Occupational History  . Not on file  Tobacco Use  . Smoking status: Former Smoker    Types: Cigarettes    Quit date: 12/25/2011    Years since quitting: 7.4  .  Smokeless tobacco: Never Used  . Tobacco comment:  smoked 1982-2013,less than 1 pack a day   Substance and Sexual Activity  . Alcohol use: Yes    Comment: Rarely  . Drug use: No  . Sexual activity: Yes    Birth control/protection: None  Other Topics Concern  . Not on file  Social History Narrative   Regular exercise- no   Social Determinants of Health   Financial Resource Strain:   . Difficulty of Paying Living Expenses:   Food Insecurity:   . Worried About Charity fundraiser in the Last Year:   . Arboriculturist in the Last Year:   Transportation Needs:   . Film/video editor (Medical):   Marland Kitchen Lack of Transportation (Non-Medical):   Physical Activity:   . Days of Exercise per Week:   . Minutes of Exercise per Session:   Stress:   . Feeling of  Stress :   Social Connections:   . Frequency of Communication with Friends and Family:   . Frequency of Social Gatherings with Friends and Family:   . Attends Religious Services:   . Active Member of Clubs or Organizations:   . Attends Archivist Meetings:   Marland Kitchen Marital Status:     Family History  Problem Relation Age of Onset  . Colon cancer Father        Stage 4  . Diabetes Father   . Coronary artery disease Mother        angioplasty in late 74s  . Hyperlipidemia Mother   . Depression Brother   . Bipolar disorder Brother   . Diabetes Brother   . Colon cancer Paternal Uncle   . Lung cancer Paternal Grandfather   . Colon cancer Paternal Grandfather   . Breast cancer Other        MGAunts  . Stroke Neg Hx     Review of Systems  Constitutional: Negative for fever.  Respiratory: Positive for shortness of breath. Negative for cough and wheezing.   Cardiovascular: Negative for chest pain, palpitations and leg swelling.  Neurological: Positive for numbness. Negative for light-headedness and headaches.       Objective:   Vitals:   06/01/19 1346  BP: 136/80  Pulse: 78  Resp: 18  Temp: 98.7 F (37.1 C)  SpO2: 96%   BP Readings from Last 3 Encounters:  06/01/19 136/80  03/01/19 (!) 146/88  03/25/18 (!) 142/80   Wt Readings from Last 3 Encounters:  06/01/19 235 lb (106.6 kg)  03/01/19 237 lb 12.8 oz (107.9 kg)  03/25/18 226 lb (102.5 kg)   Body mass index is 40.34 kg/m.   Physical Exam    Constitutional: Appears well-developed and well-nourished. No distress.  HENT:  Head: Normocephalic and atraumatic.  Neck: Neck supple. No tracheal deviation present. No thyromegaly present.  No cervical lymphadenopathy Cardiovascular: Normal rate, regular rhythm and normal heart sounds.   No murmur heard. No carotid bruit .  No edema Pulmonary/Chest: Effort normal and breath sounds normal. No respiratory distress. No has no wheezes. No rales.  Skin: Skin is warm and  dry. Not diaphoretic.  Psychiatric: Normal mood and affect. Behavior is normal.   Diabetic Foot Exam - Simple   Simple Foot Form Diabetic Foot exam was performed with the following findings: Yes   Visual Inspection No deformities, no ulcerations, no other skin breakdown bilaterally: Yes Sensation Testing Intact to touch and monofilament testing bilaterally: Yes Pulse Check Posterior Tibialis and Dorsalis pulse  intact bilaterally: Yes Comments       Assessment & Plan:    See Problem List for Assessment and Plan of chronic medical problems.    This visit occurred during the SARS-CoV-2 public health emergency.  Safety protocols were in place, including screening questions prior to the visit, additional usage of staff PPE, and extensive cleaning of exam room while observing appropriate contact time as indicated for disinfecting solutions.

## 2019-05-31 NOTE — Patient Instructions (Addendum)
Normal fasting sugar is 70-99. Prediabetic range 100-125 Diabetic range  126 and higher  Sugar should ideally be less than 140 two hours after you eat   Sugars consistently less than 150 = controlled.    Blood work was ordered.     Medications reviewed and updated.  Changes include :   none  Your prescription(s) have been submitted to your pharmacy. Please take as directed and contact our office if you believe you are having problem(s) with the medication(s).    Please followup in 6 months    Diabetes Mellitus and Nutrition, Adult When you have diabetes (diabetes mellitus), it is very important to have healthy eating habits because your blood sugar (glucose) levels are greatly affected by what you eat and drink. Eating healthy foods in the appropriate amounts, at about the same times every day, can help you:  Control your blood glucose.  Lower your risk of heart disease.  Improve your blood pressure.  Reach or maintain a healthy weight. Every person with diabetes is different, and each person has different needs for a meal plan. Your health care provider may recommend that you work with a diet and nutrition specialist (dietitian) to make a meal plan that is best for you. Your meal plan may vary depending on factors such as:  The calories you need.  The medicines you take.  Your weight.  Your blood glucose, blood pressure, and cholesterol levels.  Your activity level.  Other health conditions you have, such as heart or kidney disease. How do carbohydrates affect me? Carbohydrates, also called carbs, affect your blood glucose level more than any other type of food. Eating carbs naturally raises the amount of glucose in your blood. Carb counting is a method for keeping track of how many carbs you eat. Counting carbs is important to keep your blood glucose at a healthy level, especially if you use insulin or take certain oral diabetes medicines. It is important to know how  many carbs you can safely have in each meal. This is different for every person. Your dietitian can help you calculate how many carbs you should have at each meal and for each snack. Foods that contain carbs include:  Bread, cereal, rice, pasta, and crackers.  Potatoes and corn.  Peas, beans, and lentils.  Milk and yogurt.  Fruit and juice.  Desserts, such as cakes, cookies, ice cream, and candy. How does alcohol affect me? Alcohol can cause a sudden decrease in blood glucose (hypoglycemia), especially if you use insulin or take certain oral diabetes medicines. Hypoglycemia can be a life-threatening condition. Symptoms of hypoglycemia (sleepiness, dizziness, and confusion) are similar to symptoms of having too much alcohol. If your health care provider says that alcohol is safe for you, follow these guidelines:  Limit alcohol intake to no more than 1 drink per day for nonpregnant women and 2 drinks per day for men. One drink equals 12 oz of beer, 5 oz of wine, or 1 oz of hard liquor.  Do not drink on an empty stomach.  Keep yourself hydrated with water, diet soda, or unsweetened iced tea.  Keep in mind that regular soda, juice, and other mixers may contain a lot of sugar and must be counted as carbs. What are tips for following this plan?  Reading food labels  Start by checking the serving size on the "Nutrition Facts" label of packaged foods and drinks. The amount of calories, carbs, fats, and other nutrients listed on the label is based  on one serving of the item. Many items contain more than one serving per package.  Check the total grams (g) of carbs in one serving. You can calculate the number of servings of carbs in one serving by dividing the total carbs by 15. For example, if a food has 30 g of total carbs, it would be equal to 2 servings of carbs.  Check the number of grams (g) of saturated and trans fats in one serving. Choose foods that have low or no amount of these  fats.  Check the number of milligrams (mg) of salt (sodium) in one serving. Most people should limit total sodium intake to less than 2,300 mg per day.  Always check the nutrition information of foods labeled as "low-fat" or "nonfat". These foods may be higher in added sugar or refined carbs and should be avoided.  Talk to your dietitian to identify your daily goals for nutrients listed on the label. Shopping  Avoid buying canned, premade, or processed foods. These foods tend to be high in fat, sodium, and added sugar.  Shop around the outside edge of the grocery store. This includes fresh fruits and vegetables, bulk grains, fresh meats, and fresh dairy. Cooking  Use low-heat cooking methods, such as baking, instead of high-heat cooking methods like deep frying.  Cook using healthy oils, such as olive, canola, or sunflower oil.  Avoid cooking with butter, cream, or high-fat meats. Meal planning  Eat meals and snacks regularly, preferably at the same times every day. Avoid going long periods of time without eating.  Eat foods high in fiber, such as fresh fruits, vegetables, beans, and whole grains. Talk to your dietitian about how many servings of carbs you can eat at each meal.  Eat 4-6 ounces (oz) of lean protein each day, such as lean meat, chicken, fish, eggs, or tofu. One oz of lean protein is equal to: ? 1 oz of meat, chicken, or fish. ? 1 egg. ?  cup of tofu.  Eat some foods each day that contain healthy fats, such as avocado, nuts, seeds, and fish. Lifestyle  Check your blood glucose regularly.  Exercise regularly as told by your health care provider. This may include: ? 150 minutes of moderate-intensity or vigorous-intensity exercise each week. This could be brisk walking, biking, or water aerobics. ? Stretching and doing strength exercises, such as yoga or weightlifting, at least 2 times a week.  Take medicines as told by your health care provider.  Do not use any  products that contain nicotine or tobacco, such as cigarettes and e-cigarettes. If you need help quitting, ask your health care provider.  Work with a Social worker or diabetes educator to identify strategies to manage stress and any emotional and social challenges. Questions to ask a health care provider  Do I need to meet with a diabetes educator?  Do I need to meet with a dietitian?  What number can I call if I have questions?  When are the best times to check my blood glucose? Where to find more information:  American Diabetes Association: diabetes.org  Academy of Nutrition and Dietetics: www.eatright.[employer] of Diabetes and Digestive and Kidney Diseases (NIH): [employer] Summary  A healthy meal plan will help you control your blood glucose and maintain a healthy lifestyle.  Working with a diet and nutrition specialist (dietitian) can help you make a meal plan that is best for you.  Keep in mind that carbohydrates (carbs) and alcohol have immediate effects on  your blood glucose levels. It is important to count carbs and to use alcohol carefully. This information is not intended to replace advice given to you by your health care provider. Make sure you discuss any questions you have with your health care provider. Document Revised: 12/19/2016 Document Reviewed: 02/11/2016 Elsevier Patient Education  2020 [employer].

## 2019-06-01 ENCOUNTER — Other Ambulatory Visit: Payer: Self-pay

## 2019-06-01 ENCOUNTER — Ambulatory Visit: Payer: BC Managed Care – PPO | Admitting: Internal Medicine

## 2019-06-01 ENCOUNTER — Encounter: Payer: Self-pay | Admitting: Internal Medicine

## 2019-06-01 VITALS — BP 136/80 | HR 78 | Temp 98.7°F | Resp 18 | Ht 64.0 in | Wt 235.0 lb

## 2019-06-01 DIAGNOSIS — Z8679 Personal history of other diseases of the circulatory system: Secondary | ICD-10-CM

## 2019-06-01 DIAGNOSIS — E782 Mixed hyperlipidemia: Secondary | ICD-10-CM | POA: Diagnosis not present

## 2019-06-01 DIAGNOSIS — Z23 Encounter for immunization: Secondary | ICD-10-CM | POA: Diagnosis not present

## 2019-06-01 DIAGNOSIS — F411 Generalized anxiety disorder: Secondary | ICD-10-CM

## 2019-06-01 DIAGNOSIS — J4520 Mild intermittent asthma, uncomplicated: Secondary | ICD-10-CM

## 2019-06-01 DIAGNOSIS — E119 Type 2 diabetes mellitus without complications: Secondary | ICD-10-CM

## 2019-06-01 LAB — COMPREHENSIVE METABOLIC PANEL
ALT: 18 U/L (ref 0–35)
AST: 17 U/L (ref 0–37)
Albumin: 4.4 g/dL (ref 3.5–5.2)
Alkaline Phosphatase: 78 U/L (ref 39–117)
BUN: 8 mg/dL (ref 6–23)
CO2: 33 mEq/L — ABNORMAL HIGH (ref 19–32)
Calcium: 9.6 mg/dL (ref 8.4–10.5)
Chloride: 101 mEq/L (ref 96–112)
Creatinine, Ser: 0.75 mg/dL (ref 0.40–1.20)
GFR: 80.37 mL/min (ref >60.00)
Glucose, Bld: 76 mg/dL (ref 70–99)
Potassium: 4.4 mEq/L (ref 3.5–5.1)
Sodium: 140 mEq/L (ref 135–145)
Total Bilirubin: 0.4 mg/dL (ref 0.2–1.2)
Total Protein: 7.6 g/dL (ref 6.0–8.3)

## 2019-06-01 LAB — HEMOGLOBIN A1C: Hgb A1c MFr Bld: 6.7 % — ABNORMAL HIGH (ref 4.6–6.5)

## 2019-06-01 LAB — MICROALBUMIN / CREATININE URINE RATIO
Creatinine,U: 123.4 mg/dL
Microalb Creat Ratio: 0.7 mg/g (ref 0.0–30.0)
Microalb, Ur: 0.9 mg/dL (ref 0.0–1.9)

## 2019-06-01 LAB — LIPID PANEL
Cholesterol: 181 mg/dL (ref 0–200)
HDL: 51.4 mg/dL (ref >39.00)
LDL Cholesterol: 103 mg/dL — ABNORMAL HIGH (ref 0–99)
NonHDL: 129.51
Total CHOL/HDL Ratio: 4
Triglycerides: 135 mg/dL (ref 0.0–149.0)
VLDL: 27 mg/dL (ref 0.0–40.0)

## 2019-06-01 MED ORDER — BLOOD GLUCOSE MONITOR KIT: PACK | 0 refills | Status: DC

## 2019-06-01 MED ORDER — ALCOHOL SWABS PADS: MEDICATED_PAD | 5 refills | Status: DC

## 2019-06-01 NOTE — Assessment & Plan Note (Signed)
Chronic Controlled, stable Continue current dose of medication celexa 20 mg daily and clonazepam prn

## 2019-06-01 NOTE — Assessment & Plan Note (Signed)
Chronic Check lipid panel, cmp Continue daily statin Regular exercise and healthy diet encouraged  

## 2019-06-01 NOTE — Assessment & Plan Note (Signed)
Chronic Controlled with atenolol - continue current dose

## 2019-06-01 NOTE — Assessment & Plan Note (Addendum)
New diagnosis - dx at our last visit in 02/2019 Diet controlled Discussed nutrition, exercise, weight and how they affect sugar Discussed possible referral to nutrition Discussed metformin, ozempic Check a1c, urine microalbumin FU in 6 months

## 2019-06-02 ENCOUNTER — Encounter: Payer: Self-pay | Admitting: Internal Medicine

## 2019-06-23 ENCOUNTER — Encounter: Payer: Self-pay | Admitting: Internal Medicine

## 2019-06-26 ENCOUNTER — Other Ambulatory Visit: Payer: Self-pay | Admitting: Internal Medicine

## 2019-07-13 ENCOUNTER — Ambulatory Visit: Payer: BC Managed Care – PPO | Admitting: Family

## 2019-07-13 ENCOUNTER — Other Ambulatory Visit: Payer: Self-pay

## 2019-07-13 ENCOUNTER — Encounter: Payer: Self-pay | Admitting: Family

## 2019-07-13 VITALS — BP 132/72 | HR 76 | Temp 98.6°F

## 2019-07-13 DIAGNOSIS — M791 Myalgia, unspecified site: Secondary | ICD-10-CM

## 2019-07-13 DIAGNOSIS — S30860A Insect bite (nonvenomous) of lower back and pelvis, initial encounter: Secondary | ICD-10-CM | POA: Diagnosis not present

## 2019-07-13 DIAGNOSIS — R599 Enlarged lymph nodes, unspecified: Secondary | ICD-10-CM

## 2019-07-13 DIAGNOSIS — W57XXXA Bitten or stung by nonvenomous insect and other nonvenomous arthropods, initial encounter: Secondary | ICD-10-CM

## 2019-07-13 DIAGNOSIS — L03312 Cellulitis of back [any part except buttock]: Secondary | ICD-10-CM | POA: Diagnosis not present

## 2019-07-13 MED ORDER — DOXYCYCLINE HYCLATE 100 MG PO TABS: 100.0000 mg | ORAL_TABLET | Freq: Two times a day (BID) | ORAL | 0 refills | Status: DC

## 2019-07-13 MED ORDER — TRIAMCINOLONE ACETONIDE 0.1 % EX CREA: 1.0000 "application " | TOPICAL_CREAM | Freq: Two times a day (BID) | CUTANEOUS | 0 refills | Status: DC

## 2019-07-13 NOTE — Progress Notes (Signed)
Amanda [name] is a 54 y.o. female with the following history as recorded in EpicCare:  Patient Active Problem List   Diagnosis Date Noted  . Chest pain 01/22/2017  . Diabetes mellitus without complication ([place]) 09/98/3382  . Anxiety state 05/26/2014  . DDD (degenerative disc disease), cervical 12/16/2012  . GERD (gastroesophageal reflux disease) 11/08/2012  . H/O ventricular tachycardia 11/08/2012  . RBBB 03/29/2012  . HERPES ZOSTER 02/11/2010  . COLONIC POLYPS, HX OF 02/04/2010  . OVARIAN CYST, LEFT 11/17/2008  . Mixed hyperlipidemia 09/27/2008  . ALLERGIC RHINITIS 09/27/2008  . Asthma 09/27/2008    Current Outpatient Medications  Medication Sig Dispense Refill  . albuterol (VENTOLIN HFA) 108 (90 Base) MCG/ACT inhaler INHALE 2 PUFFS BY MOUTH EVERY 4 HOURS AS NEEDED FOR WHEEZING 18 g 5  . Alcohol Swabs PADS Use as directed to check sugars 100 each 5  . atenolol (TENORMIN) 50 MG tablet TAKE 1 TABLET BY MOUTH DAILY 90 tablet 0  . blood glucose meter kit and supplies KIT Dispense based on patient and insurance preference. Use up to four times daily as directed. (FOR E11.9). 1 each 0  . citalopram (CELEXA) 20 MG tablet TAKE 1 TABLET(20 MG) BY MOUTH DAILY 90 tablet 1  . clonazePAM (KLONOPIN) 0.5 MG tablet TAKE 1 TO 1 AND 1/2 TABLETS BY MOUTH EVERY 8 TO 12 HOURS AS NEEDED 30 tablet 0  . HYDROcodone-acetaminophen (NORCO) 10-325 MG per tablet Take 1 tablet by mouth 5 (five) times daily.     . mometasone-formoterol (DULERA) 200-5 MCG/ACT AERO Inhale 2 puffs into the lungs 2 (two) times daily. 39 g 1  . naloxone (NARCAN) nasal spray 4 mg/0.1 mL Place 1 spray into the nose once as needed (overdose).    Glory Rosebush ULTRA test strip TEST FOUR TIMES DAILY 100 strip 3  . rosuvastatin (CRESTOR) 5 MG tablet TAKE 1 TABLET BY MOUTH DAILY 90 tablet 0  . doxycycline (VIBRA-TABS) 100 MG tablet Take 1 tablet (100 mg total) by mouth 2 (two) times daily. 28 tablet 0  . triamcinolone cream (KENALOG) 0.1 % Apply 1  application topically 2 (two) times daily. 45 g 0   No current facility-administered medications for this visit.    Allergies: Monosodium glutamate and Cat hair extract  Past Medical History:  Diagnosis Date  . Allergy    rhinitis, seasonal  . Anxiety   . Asthma    extrinsic  . GERD (gastroesophageal reflux disease)   . Hemorrhoids   . Hx of colonic polyps 2010   hyperplastic  . Hyperglycemia    borderline  . Hyperlipidemia    borderline  . Tachycardia, paroxysmal ([place])    Dr Caryl Comes    Past Surgical History:  Procedure Laterality Date  . ABDOMINAL HYSTERECTOMY  2004   For Abnormal Pap & painful menses, Dr Deatra Ina  . COLONOSCOPY W/ POLYPECTOMY  11/2008   Dr Oletta Lamas  . ESI     X 2; Dr Nelva Bush  . UPPER GI ENDOSCOPY  04/2012   esophageal erosions ; Dr Oletta Lamas  . WISDOM TOOTH EXTRACTION  02/13/2009    Family History  Problem Relation Age of Onset  . Colon cancer Father        Stage 4  . Diabetes Father   . Coronary artery disease Mother        angioplasty in late 18s  . Hyperlipidemia Mother   . Depression Brother   . Bipolar disorder Brother   . Diabetes Brother   . Colon  cancer Paternal Uncle   . Lung cancer Paternal Grandfather   . Colon cancer Paternal Grandfather   . Breast cancer Other        MGAunts  . Stroke Neg Hx     Social History   Tobacco Use  . Smoking status: Former Smoker    Types: Cigarettes    Quit date: 12/25/2011    Years since quitting: 7.5  . Smokeless tobacco: Never Used  . Tobacco comment:  smoked 1982-2013,less than 1 pack a day   Substance Use Topics  . Alcohol use: Yes    Comment: Rarely    Subjective:   Concerned about reaction to tick bite; pulled tick off earlier this month; having some increased joint pain/ feels like her glands are swollen; has been documenting the area at the site of the bite and has noticed a red circle that is continuing to increase in size;    Objective:  Vitals:   07/13/19 1252  BP: 132/72  Pulse: 76   Temp: 98.6 F (37 C)  TempSrc: Oral  SpO2: 94%    General: Well developed, well nourished, in no acute distress  Skin : Warm and dry. Area of erythema/ circular lesion noted in mid back with centralized insect bite noted Head: Normocephalic and atraumatic  Eyes: Sclera and conjunctiva clear; pupils round and reactive to light; extraocular movements intact  Ears: External normal; canals clear; tympanic membranes normal  Oropharynx: Pink, supple. No suspicious lesions  Neck: Supple without thyromegaly, adenopathy  Lungs: Respirations unlabored;  Musculoskeletal: No deformities; no active joint inflammation  Extremities: No edema, cyanosis, clubbing  Vessels: Symmetric bilaterally  Neurologic: Alert and oriented; speech intact; face symmetrical; moves all extremities well; CNII-XII intact without focal deficit   Assessment:  1. Cellulitis of back except buttock   2. Tick bite of back, initial encounter   3. Myalgia   4. Glands swollen     Plan:  Rx for Doxycycline 100 mg bid x 14 days; Rx for Triamcinolone cream apply bid to affected area; follow-up worse, no better especially if glands continue to feel swollen;   This visit occurred during the SARS-CoV-2 public health emergency.  Safety protocols were in place, including screening questions prior to the visit, additional usage of staff PPE, and extensive cleaning of exam room while observing appropriate contact time as indicated for disinfecting solutions.     No follow-ups on file.  No orders of the defined types were placed in this encounter.   Requested Prescriptions   Signed Prescriptions Disp Refills  . doxycycline (VIBRA-TABS) 100 MG tablet 28 tablet 0    Sig: Take 1 tablet (100 mg total) by mouth 2 (two) times daily.  Marland Kitchen triamcinolone cream (KENALOG) 0.1 % 45 g 0    Sig: Apply 1 application topically 2 (two) times daily.

## 2019-07-21 ENCOUNTER — Encounter: Payer: Self-pay | Admitting: Family

## 2019-07-22 ENCOUNTER — Encounter: Payer: Self-pay | Admitting: Internal Medicine

## 2019-07-22 ENCOUNTER — Other Ambulatory Visit: Payer: Self-pay | Admitting: Internal Medicine

## 2019-07-22 NOTE — Telephone Encounter (Signed)
Already sent.

## 2019-08-29 ENCOUNTER — Ambulatory Visit: Payer: BC Managed Care – PPO | Admitting: Internal Medicine

## 2019-09-13 DIAGNOSIS — Z79899 Other long term (current) drug therapy: Secondary | ICD-10-CM | POA: Diagnosis not present

## 2019-09-13 DIAGNOSIS — G894 Chronic pain syndrome: Secondary | ICD-10-CM | POA: Diagnosis not present

## 2019-09-13 DIAGNOSIS — M5030 Other cervical disc degeneration, unspecified cervical region: Secondary | ICD-10-CM | POA: Diagnosis not present

## 2019-09-19 ENCOUNTER — Encounter: Payer: Self-pay | Admitting: Internal Medicine

## 2019-09-19 ENCOUNTER — Telehealth: Payer: Self-pay | Admitting: Internal Medicine

## 2019-09-19 DIAGNOSIS — Z1211 Encounter for screening for malignant neoplasm of colon: Secondary | ICD-10-CM

## 2019-09-19 NOTE — Telephone Encounter (Signed)
Referral ordered

## 2019-09-19 NOTE — Telephone Encounter (Signed)
Patient needs a referral to GI Patient wants someone in the [facility]  Re: Hemorrhoids, fam hx colon cancer,  Thursday appointment with [facility] physicians, if can get one sooner, would be great.. If not, will keep that appointment

## 2019-09-19 NOTE — Telephone Encounter (Signed)
She will not be able to get in with GI at [facility] sooner than Thursday so I would advise she keep that appointment.

## 2019-09-19 NOTE — Telephone Encounter (Signed)
Notified pt w/MD response. Pt states she will see a PA on Thurs for the hemorrhoids, but will like to get in w/[facility] for colonoscopy. she states Dr. Percell Miller is no longer at [facility] and she is going to have them to transfer her records to [place].Marland KitchenJohny Chess

## 2019-09-22 DIAGNOSIS — K645 Perianal venous thrombosis: Secondary | ICD-10-CM | POA: Diagnosis not present

## 2019-09-22 DIAGNOSIS — Z8371 Family history of colonic polyps: Secondary | ICD-10-CM | POA: Diagnosis not present

## 2019-09-22 DIAGNOSIS — Z800 Family history of malignant neoplasm of digestive organs: Secondary | ICD-10-CM | POA: Diagnosis not present

## 2019-09-23 DIAGNOSIS — K645 Perianal venous thrombosis: Secondary | ICD-10-CM | POA: Diagnosis not present

## 2019-10-24 ENCOUNTER — Ambulatory Visit: Payer: BC Managed Care – PPO | Attending: Internal Medicine

## 2019-10-24 DIAGNOSIS — Z23 Encounter for immunization: Secondary | ICD-10-CM

## 2019-10-24 NOTE — Progress Notes (Signed)
   Covid-19 Vaccination Clinic  Name:  ARLYSS WEATHERSBY    MRN: [id number] DOB: April 10, 1965  10/24/2019  Ms. Kleist was observed post Covid-19 immunization for 15 minutes without incident. She was provided with Vaccine Information Sheet and instruction to access the V-Safe system.   Ms. Risenhoover was instructed to call 911 with any severe reactions post vaccine: Marland Kitchen Difficulty breathing  . Swelling of face and throat  . A fast heartbeat  . A bad rash all over body  . Dizziness and weakness

## 2019-11-09 DIAGNOSIS — Z01812 Encounter for preprocedural laboratory examination: Secondary | ICD-10-CM | POA: Diagnosis not present

## 2019-11-14 DIAGNOSIS — Z800 Family history of malignant neoplasm of digestive organs: Secondary | ICD-10-CM | POA: Diagnosis not present

## 2019-11-14 DIAGNOSIS — K635 Polyp of colon: Secondary | ICD-10-CM | POA: Diagnosis not present

## 2019-11-14 DIAGNOSIS — Z8371 Family history of colonic polyps: Secondary | ICD-10-CM | POA: Diagnosis not present

## 2019-11-14 DIAGNOSIS — Z1211 Encounter for screening for malignant neoplasm of colon: Secondary | ICD-10-CM | POA: Diagnosis not present

## 2019-11-14 DIAGNOSIS — K644 Residual hemorrhoidal skin tags: Secondary | ICD-10-CM | POA: Diagnosis not present

## 2019-11-14 DIAGNOSIS — K648 Other hemorrhoids: Secondary | ICD-10-CM | POA: Diagnosis not present

## 2019-11-14 DIAGNOSIS — D123 Benign neoplasm of transverse colon: Secondary | ICD-10-CM | POA: Diagnosis not present

## 2019-11-14 LAB — HM COLONOSCOPY

## 2019-11-25 ENCOUNTER — Encounter: Payer: Self-pay | Admitting: Internal Medicine

## 2019-11-25 LAB — LIPID PANEL
Cholesterol: 187 (ref 0–200)
HDL: 48 (ref 35–70)
LDL Cholesterol: 113
LDl/HDL Ratio: 3.9
Triglycerides: 151 (ref 40–160)

## 2019-11-25 LAB — HEMOGLOBIN A1C: Hemoglobin A1C: 6.5

## 2019-12-01 NOTE — Patient Instructions (Addendum)
     Medications reviewed and updated.  Changes include :   ambien 5 mg as needed for night - take as needed only  Your prescription(s) have been submitted to your pharmacy. Please take as directed and contact our office if you believe you are having problem(s) with the medication(s).      Please followup in 6 months

## 2019-12-01 NOTE — Progress Notes (Signed)
Subjective:    Patient ID: Amanda [name], female    DOB: [birth year], 54 y.o.   MRN: [id number]  HPI The patient is here for follow up of their chronic medical problems, including DM, h/o VT, hyperlipidemia, anxiety, obesity, asthma  She had labs done at work.  a1c 6.5%,  total chol 187, HDL  48,  Trigs 151, LDL 113   Her heels have been hurting.  She denies injury.  She has pain with walking.  It started over the summer.  It is not worse first thing in the morning.  It is worse after sitting for a long time period and she gets up.  She works from home and is always barefoot.    She has decreased her snacking.  She is not currently doing exercise.  She did exercise over the summer. She has lost some weight.      Medications and allergies reviewed with patient and updated if appropriate.  Patient Active Problem List   Diagnosis Date Noted  . Heel pain, bilateral 12/02/2019  . Insomnia 12/02/2019  . Diabetes mellitus without complication ([place]) 91/63/8466  . Anxiety state 05/26/2014  . DDD (degenerative disc disease), cervical 12/16/2012  . GERD (gastroesophageal reflux disease) 11/08/2012  . H/O ventricular tachycardia 11/08/2012  . RBBB 03/29/2012  . HERPES ZOSTER 02/11/2010  . COLONIC POLYPS, HX OF 02/04/2010  . OVARIAN CYST, LEFT 11/17/2008  . Mixed hyperlipidemia 09/27/2008  . ALLERGIC RHINITIS 09/27/2008  . Asthma 09/27/2008    Current Outpatient Medications on File Prior to Visit  Medication Sig Dispense Refill  . albuterol (VENTOLIN HFA) 108 (90 Base) MCG/ACT inhaler INHALE 2 PUFFS BY MOUTH EVERY 4 HOURS AS NEEDED FOR WHEEZING 18 g 5  . Alcohol Swabs PADS Use as directed to check sugars 100 each 5  . atenolol (TENORMIN) 50 MG tablet TAKE 1 TABLET BY MOUTH DAILY 90 tablet 0  . citalopram (CELEXA) 20 MG tablet TAKE 1 TABLET(20 MG) BY MOUTH DAILY 90 tablet 1  . clonazePAM (KLONOPIN) 0.5 MG tablet TAKE 1 TO 1 AND 1/2 TABLETS BY MOUTH EVERY 8 TO 12 HOURS AS NEEDED 30  tablet 0  . HYDROcodone-acetaminophen (NORCO) 10-325 MG per tablet Take 1 tablet by mouth 5 (five) times daily.     . naloxone (NARCAN) nasal spray 4 mg/0.1 mL Place 1 spray into the nose once as needed (overdose).    Glory Rosebush ULTRA test strip TEST FOUR TIMES DAILY 100 strip 3  . rosuvastatin (CRESTOR) 5 MG tablet TAKE 1 TABLET BY MOUTH DAILY 90 tablet 0  . triamcinolone cream (KENALOG) 0.1 % Apply 1 application topically 2 (two) times daily. 45 g 0   No current facility-administered medications on file prior to visit.    Past Medical History:  Diagnosis Date  . Allergy    rhinitis, seasonal  . Anxiety   . Asthma    extrinsic  . GERD (gastroesophageal reflux disease)   . Hemorrhoids   . Hx of colonic polyps 2010   hyperplastic  . Hyperglycemia    borderline  . Hyperlipidemia    borderline  . Tachycardia, paroxysmal ([place])    Dr Caryl Comes    Past Surgical History:  Procedure Laterality Date  . ABDOMINAL HYSTERECTOMY  2004   For Abnormal Pap & painful menses, Dr Deatra Ina  . COLONOSCOPY W/ POLYPECTOMY  11/2008   Dr Oletta Lamas  . ESI     X 2; Dr Nelva Bush  . UPPER GI ENDOSCOPY  04/2012  esophageal erosions ; Dr Oletta Lamas  . WISDOM TOOTH EXTRACTION  02/13/2009    Social History   Socioeconomic History  . Marital status: Married    Spouse name: Not on file  . Number of children: Not on file  . Years of education: Not on file  . Highest education level: Not on file  Occupational History  . Not on file  Tobacco Use  . Smoking status: Former Smoker    Types: Cigarettes    Quit date: 12/25/2011    Years since quitting: 7.9  . Smokeless tobacco: Never Used  . Tobacco comment:  smoked 1982-2013,less than 1 pack a day   Substance and Sexual Activity  . Alcohol use: Yes    Comment: Rarely  . Drug use: No  . Sexual activity: Yes    Birth control/protection: None  Other Topics Concern  . Not on file  Social History Narrative   Regular exercise- no   Social Determinants of  Health   Financial Resource Strain:   . Difficulty of Paying Living Expenses: Not on file  Food Insecurity:   . Worried About Charity fundraiser in the Last Year: Not on file  . Ran Out of Food in the Last Year: Not on file  Transportation Needs:   . Lack of Transportation (Medical): Not on file  . Lack of Transportation (Non-Medical): Not on file  Physical Activity:   . Days of Exercise per Week: Not on file  . Minutes of Exercise per Session: Not on file  Stress:   . Feeling of Stress : Not on file  Social Connections:   . Frequency of Communication with Friends and Family: Not on file  . Frequency of Social Gatherings with Friends and Family: Not on file  . Attends Religious Services: Not on file  . Active Member of Clubs or Organizations: Not on file  . Attends Archivist Meetings: Not on file  . Marital Status: Not on file    Family History  Problem Relation Age of Onset  . Colon cancer Father        Stage 4  . Diabetes Father   . Coronary artery disease Mother        angioplasty in late 4s  . Hyperlipidemia Mother   . Depression Brother   . Bipolar disorder Brother   . Diabetes Brother   . Colon cancer Paternal Uncle   . Lung cancer Paternal Grandfather   . Colon cancer Paternal Grandfather   . Breast cancer Other        MGAunts  . Stroke Neg Hx     Review of Systems  Constitutional: Negative for chills and fever.  Respiratory: Positive for wheezing (asthma, allergy related in fall). Negative for cough and shortness of breath.   Cardiovascular: Negative for chest pain, palpitations and leg swelling.  Neurological: Positive for headaches. Negative for light-headedness.       Objective:   Vitals:   12/02/19 1347  BP: 130/74  Pulse: 77  Temp: 98.2 F (36.8 C)  SpO2: 95%   BP Readings from Last 3 Encounters:  12/02/19 130/74  07/13/19 132/72  06/01/19 136/80   Wt Readings from Last 3 Encounters:  12/02/19 224 lb (101.6 kg)  06/01/19  235 lb (106.6 kg)  03/01/19 237 lb 12.8 oz (107.9 kg)   Body mass index is 38.45 kg/m.   Physical Exam    Constitutional: Appears well-developed and well-nourished. No distress.  HENT:  Head: Normocephalic and atraumatic.  Neck: Neck supple. No tracheal deviation present. No thyromegaly present.  No cervical lymphadenopathy Cardiovascular: Normal rate, regular rhythm and normal heart sounds.   No murmur heard. No carotid bruit .  No edema Pulmonary/Chest: [place] normal and breath sounds normal. No respiratory distress. No has no wheezes. No rales.  Msk: pain with palpation of distal achilles with possible spur Skin: Skin is warm and dry. Not diaphoretic.  Psychiatric: Normal mood and affect. Behavior is normal.      Assessment & Plan:    See Problem List for Assessment and Plan of chronic medical problems.    This visit occurred during the SARS-CoV-2 public health emergency.  Safety protocols were in place, including screening questions prior to the visit, additional usage of staff PPE, and extensive cleaning of exam room while observing appropriate contact time as indicated for disinfecting solutions.

## 2019-12-02 ENCOUNTER — Encounter: Payer: Self-pay | Admitting: Internal Medicine

## 2019-12-02 ENCOUNTER — Other Ambulatory Visit: Payer: Self-pay

## 2019-12-02 ENCOUNTER — Ambulatory Visit: Payer: BC Managed Care – PPO | Admitting: Internal Medicine

## 2019-12-02 VITALS — BP 130/74 | HR 77 | Temp 98.2°F | Ht 64.0 in | Wt 224.0 lb

## 2019-12-02 DIAGNOSIS — M7660 Achilles tendinitis, unspecified leg: Secondary | ICD-10-CM | POA: Insufficient documentation

## 2019-12-02 DIAGNOSIS — F411 Generalized anxiety disorder: Secondary | ICD-10-CM

## 2019-12-02 DIAGNOSIS — G4700 Insomnia, unspecified: Secondary | ICD-10-CM | POA: Insufficient documentation

## 2019-12-02 DIAGNOSIS — E782 Mixed hyperlipidemia: Secondary | ICD-10-CM | POA: Diagnosis not present

## 2019-12-02 DIAGNOSIS — J4520 Mild intermittent asthma, uncomplicated: Secondary | ICD-10-CM | POA: Diagnosis not present

## 2019-12-02 DIAGNOSIS — M79671 Pain in right foot: Secondary | ICD-10-CM | POA: Insufficient documentation

## 2019-12-02 DIAGNOSIS — F5101 Primary insomnia: Secondary | ICD-10-CM

## 2019-12-02 DIAGNOSIS — Z23 Encounter for immunization: Secondary | ICD-10-CM

## 2019-12-02 DIAGNOSIS — Z8679 Personal history of other diseases of the circulatory system: Secondary | ICD-10-CM

## 2019-12-02 DIAGNOSIS — E119 Type 2 diabetes mellitus without complications: Secondary | ICD-10-CM

## 2019-12-02 MED ORDER — ZOLPIDEM TARTRATE 5 MG PO TABS: 5.0000 mg | ORAL_TABLET | Freq: Every evening | ORAL | 0 refills | Status: DC | PRN

## 2019-12-02 MED ORDER — MOMETASONE FURO-FORMOTEROL FUM 200-5 MCG/ACT IN AERO: 2.0000 | INHALATION_SPRAY | Freq: Two times a day (BID) | RESPIRATORY_TRACT | 1 refills | Status: DC

## 2019-12-02 NOTE — Assessment & Plan Note (Signed)
Chronic Controlled with atenolol 50 mg daily Continue

## 2019-12-02 NOTE — Assessment & Plan Note (Signed)
Chronic lipid panel done at works - well controlled Continue crestor 5 mg Regular exercise and healthy diet encouraged

## 2019-12-02 NOTE — Assessment & Plan Note (Signed)
Acute Has been having difficulty sleeping - has had this problem in the past - typically occurs one night a week Was on ambien in the past - this worked well Will try melatonin Will rx ambien 5 mg - will only use as needed

## 2019-12-02 NOTE — Assessment & Plan Note (Signed)
Chronic Mild, intermittent dulera seasonally daily, but not year round Uses albuterol prn continue

## 2019-12-02 NOTE — Progress Notes (Unsigned)
Outside notes received. Information abstracted. Notes sent to scan.  

## 2019-12-02 NOTE — Assessment & Plan Note (Signed)
Chronic Eye exam - due - will schedule a1c 6.5% at work Well controlled with diet Encouraged low sugar/carb diet, regular exercise, weight loss

## 2019-12-02 NOTE — Assessment & Plan Note (Signed)
Acute Exam and symptoms suggestive of insertional achilles tendopathy Will see ortho next month  Will start nsaids - can try voltaren gel or oral Ice

## 2019-12-02 NOTE — Assessment & Plan Note (Signed)
Chronic Controlled, stable Continue citalopram 20 mg daily Rarely takes clonazepam 0.5 mg daily as needed, continue

## 2019-12-05 ENCOUNTER — Other Ambulatory Visit: Payer: Self-pay | Admitting: Internal Medicine

## 2019-12-08 ENCOUNTER — Other Ambulatory Visit: Payer: Self-pay | Admitting: Internal Medicine

## 2019-12-08 ENCOUNTER — Encounter: Payer: Self-pay | Admitting: Internal Medicine

## 2019-12-08 MED ORDER — FLUTICASONE-SALMETEROL 250-50 MCG/DOSE IN AEPB: 1.0000 | INHALATION_SPRAY | Freq: Two times a day (BID) | RESPIRATORY_TRACT | 5 refills | Status: DC

## 2019-12-13 ENCOUNTER — Other Ambulatory Visit: Payer: Self-pay | Admitting: Internal Medicine

## 2019-12-13 DIAGNOSIS — Z1231 Encounter for screening mammogram for malignant neoplasm of breast: Secondary | ICD-10-CM

## 2019-12-20 ENCOUNTER — Other Ambulatory Visit: Payer: Self-pay | Admitting: Internal Medicine

## 2020-01-10 DIAGNOSIS — Z79899 Other long term (current) drug therapy: Secondary | ICD-10-CM | POA: Diagnosis not present

## 2020-01-10 DIAGNOSIS — G894 Chronic pain syndrome: Secondary | ICD-10-CM | POA: Diagnosis not present

## 2020-01-10 DIAGNOSIS — M5030 Other cervical disc degeneration, unspecified cervical region: Secondary | ICD-10-CM | POA: Diagnosis not present

## 2020-02-27 ENCOUNTER — Other Ambulatory Visit: Payer: Self-pay | Admitting: Internal Medicine

## 2020-02-27 DIAGNOSIS — F411 Generalized anxiety disorder: Secondary | ICD-10-CM

## 2020-02-27 DIAGNOSIS — M7661 Achilles tendinitis, right leg: Secondary | ICD-10-CM | POA: Diagnosis not present

## 2020-02-27 DIAGNOSIS — M6701 Short Achilles tendon (acquired), right ankle: Secondary | ICD-10-CM | POA: Diagnosis not present

## 2020-03-05 ENCOUNTER — Other Ambulatory Visit: Payer: Self-pay | Admitting: Internal Medicine

## 2020-03-08 ENCOUNTER — Encounter: Payer: Self-pay | Admitting: Internal Medicine

## 2020-03-08 NOTE — Progress Notes (Signed)
Outside notes received. Information abstracted. Notes sent to scan.  

## 2020-05-21 ENCOUNTER — Other Ambulatory Visit (HOSPITAL_BASED_OUTPATIENT_CLINIC_OR_DEPARTMENT_OTHER): Payer: Self-pay

## 2020-05-21 ENCOUNTER — Ambulatory Visit: Payer: BC Managed Care – PPO | Attending: Internal Medicine

## 2020-05-21 ENCOUNTER — Other Ambulatory Visit: Payer: Self-pay

## 2020-05-21 DIAGNOSIS — Z23 Encounter for immunization: Secondary | ICD-10-CM

## 2020-05-21 DIAGNOSIS — M542 Cervicalgia: Secondary | ICD-10-CM | POA: Diagnosis not present

## 2020-05-21 MED ORDER — PFIZER-BIONT COVID-19 VAC-TRIS 30 MCG/0.3ML IM SUSP
INTRAMUSCULAR | 0 refills | Status: DC
  Filled 2020-05-21: qty 0.3, 1d supply, fill #0

## 2020-05-21 NOTE — Progress Notes (Signed)
   Covid-19 Vaccination Clinic  Name:  Amanda [name]    MRN: [id number] DOB: 1965/11/07  05/21/2020  [name] was observed post Covid-19 immunization for 15 minutes without incident. She was provided with Vaccine Information Sheet and instruction to access the V-Safe system.   [name] was instructed to call 911 with any severe reactions post vaccine: Marland Kitchen Difficulty breathing  . Swelling of face and throat  . A fast heartbeat  . A bad rash all over body  . Dizziness and weakness   Immunizations Administered    Name Date Dose VIS Date Route   PFIZER Comrnaty(Gray TOP) Covid-19 Vaccine 05/21/2020 10:19 AM 0.3 mL 12/29/2019 Intramuscular   Manufacturer: [employer], [employer]   Lot: [id number]   NDC: [phone]

## 2020-05-29 ENCOUNTER — Other Ambulatory Visit: Payer: Self-pay | Admitting: Internal Medicine

## 2020-06-06 NOTE — Progress Notes (Signed)
Subjective:    Patient ID: Amanda [name], female    DOB: [birth year], 55 y.o.   MRN: [id number]   This visit occurred during the SARS-CoV-2 public health emergency.  Safety protocols were in place, including screening questions prior to the visit, additional usage of staff PPE, and extensive cleaning of exam room while observing appropriate contact time as indicated for disinfecting solutions.    HPI She is here for a physical exam.   She denies any significant changes in her health.  She is under increased stress-she is having some marital issues, which not new.  Medications and allergies reviewed with patient and updated if appropriate.  Patient Active Problem List   Diagnosis Date Noted  . Depression 06/07/2020  . Insomnia 12/02/2019  . 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 Diabetes mellitus without complication ([place]) 17/51/0258  . Anxiety state 05/26/2014  . DDD (degenerative disc disease), cervical 12/16/2012  . GERD (gastroesophageal reflux disease) 11/08/2012  . H/O ventricular tachycardia 11/08/2012  . RBBB 03/29/2012  . HERPES ZOSTER 02/11/2010  . COLONIC POLYPS, HX OF 02/04/2010  . OVARIAN CYST, LEFT 11/17/2008  . Mixed hyperlipidemia 09/27/2008  . ALLERGIC RHINITIS 09/27/2008  . Asthma 09/27/2008    Current Outpatient Medications on File Prior to Visit  Medication Sig Dispense Refill  . albuterol (VENTOLIN HFA) 108 (90 Base) MCG/ACT inhaler INHALE 2 PUFFS BY MOUTH EVERY 4 HOURS AS NEEDED FOR WHEEZING 18 g 5  . atenolol (TENORMIN) 50 MG tablet TAKE 1 TABLET BY MOUTH DAILY 90 tablet 0  . clonazePAM (KLONOPIN) 0.5 MG tablet TAKE 1 TO 1 AND 1/2 TABLETS BY MOUTH EVERY 8 TO 12 HOURS AS NEEDED 30 tablet 0  . HYDROcodone-acetaminophen (NORCO) 10-325 MG per tablet Take 1 tablet by mouth 5 (five) times daily.     . rosuvastatin (CRESTOR) 5 MG tablet TAKE 1 TABLET BY MOUTH DAILY 90 tablet 0  . zolpidem (AMBIEN) 5 MG tablet Take 1 tablet (5 mg total) by mouth at  bedtime as needed for sleep. 30 tablet 0   No current facility-administered medications on file prior to visit.    Past Medical History:  Diagnosis Date  . Allergy    rhinitis, seasonal  . Anxiety   . Asthma    extrinsic  . GERD (gastroesophageal reflux disease)   . Hemorrhoids   . Hx of colonic polyps 2010   hyperplastic  . Hyperglycemia    borderline  . Hyperlipidemia    borderline  . Tachycardia, paroxysmal ([place])    Dr Caryl Comes    Past Surgical History:  Procedure Laterality Date  . ABDOMINAL HYSTERECTOMY  2004   For Abnormal Pap & painful menses, Dr Deatra Ina  . COLONOSCOPY W/ POLYPECTOMY  11/2008   Dr Oletta Lamas  . ESI     X 2; Dr Nelva Bush  . UPPER GI ENDOSCOPY  04/2012   esophageal erosions ; Dr Oletta Lamas  . WISDOM TOOTH EXTRACTION  02/13/2009    Social History   Socioeconomic History  . Marital status: Married    Spouse name: Not on file  . Number of children: Not on file  . Years of education: Not on file  . Highest education level: Not on file  Occupational History  . Not on file  Tobacco Use  . Smoking status: Former Smoker    Types: Cigarettes    Quit date: 12/25/2011    Years since quitting: 8.4  . Smokeless tobacco: Never Used  . Tobacco comment:  smoked 1982-2013,less than  1 pack a day   Substance and Sexual Activity  . Alcohol use: Yes    Comment: Rarely  . Drug use: No  . Sexual activity: Yes    Birth control/protection: None  Other Topics Concern  . Not on file  Social History Narrative   Regular exercise- no   Social Determinants of Health   Financial Resource Strain: Not on file  Food Insecurity: Not on file  Transportation Needs: Not on file  Physical Activity: Not on file  Stress: Not on file  Social Connections: Not on file    Family History  Problem Relation Age of Onset  . Colon cancer Father        Stage 4  . Diabetes Father   . Coronary artery disease Mother        angioplasty in late 39s  . Hyperlipidemia Mother   .  Depression Brother   . Bipolar disorder Brother   . Diabetes Brother   . Colon cancer Paternal Uncle   . Lung cancer Paternal Grandfather   . Colon cancer Paternal Grandfather   . Breast cancer Other        MGAunts  . Stroke Neg Hx     Review of Systems  Constitutional: Negative for chills and fever.  Eyes: Negative for visual disturbance.  Respiratory: Positive for chest tightness (with allergies), shortness of breath (from her weight) and wheezing (from her weight). Negative for cough.   Cardiovascular: Positive for chest pain (right sided - ? indigestion). Negative for palpitations and leg swelling.  Gastrointestinal: Negative for abdominal pain, blood in stool, constipation, diarrhea and nausea.       Gerd - pepcid 20 mg prn  Endocrine: Positive for heat intolerance.  Genitourinary: Negative for dysuria.  Musculoskeletal: Positive for arthralgias and neck pain. Negative for back pain.  Skin: Negative for color change and rash.  Neurological: Negative for light-headedness and headaches.  Psychiatric/Behavioral: Positive for dysphoric mood and sleep disturbance. Negative for suicidal ideas. The patient is nervous/anxious.        Objective:   Vitals:   06/07/20 0958  BP: 130/74  Pulse: 80  Temp: 98.6 F (37 C)  SpO2: 96%   Filed Weights   06/07/20 0958  Weight: 221 lb 12.8 oz (100.6 kg)   Body mass index is 38.07 kg/m.  BP Readings from Last 3 Encounters:  06/07/20 130/74  12/02/19 130/74  07/13/19 132/72    Wt Readings from Last 3 Encounters:  06/07/20 221 lb 12.8 oz (100.6 kg)  12/02/19 224 lb (101.6 kg)  06/01/19 235 lb (106.6 kg)    Depression screen [facility] 2/9 06/07/2020 06/01/2019 03/15/2018 12/03/2016  Decreased Interest 2 0 0 0  Down, Depressed, Hopeless 2 0 0 0  PHQ - 2 Score 4 0 0 0  Altered sleeping 3 - 0 -  Tired, decreased energy 2 - 0 -  Change in appetite 2 - 0 -  Feeling bad or failure about yourself  2 - 0 -  Trouble concentrating 0 - 0 -   Moving slowly or fidgety/restless 0 - 0 -  Suicidal thoughts 0 - 0 -  PHQ-9 Score 13 - 0 -  Difficult doing work/chores Somewhat difficult - - -    GAD 7 : Generalized Anxiety Score 06/07/2020  Nervous, Anxious, on Edge 1  Control/stop worrying 0  Worry too much - different things 0  Trouble relaxing 1  Restless 0  Easily annoyed or irritable 1  Afraid - awful might  happen 1  Total GAD 7 Score 4  Anxiety Difficulty Somewhat difficult        Physical Exam Constitutional: She appears well-developed and well-nourished. No distress.  HENT:  Head: Normocephalic and atraumatic.  Right Ear: External ear normal. Normal ear canal and TM Left Ear: External ear normal.  Normal ear canal and TM Mouth/Throat: Oropharynx is clear and moist.  Eyes: Conjunctivae and EOM are normal.  Neck: Neck supple. No tracheal deviation present. No thyromegaly present.  No carotid bruit  Cardiovascular: Normal rate, regular rhythm and normal heart sounds.   No murmur heard.  No edema. Pulmonary/Chest: Effort normal and breath sounds normal. No respiratory distress. She has no wheezes. She has no rales.  Breast: deferred   Abdominal: Soft. She exhibits no distension. There is no tenderness.  Lymphadenopathy: She has no cervical adenopathy.  Skin: Skin is warm and dry. She is not diaphoretic.  Psychiatric: She has a normal mood and affect. Her behavior is normal.   Diabetic Foot Exam - Simple   Simple Foot Form Diabetic Foot exam was performed with the following findings: Yes 06/07/2020 12:11 PM  Visual Inspection No deformities, no ulcerations, no other skin breakdown bilaterally: Yes Sensation Testing Intact to touch and monofilament testing bilaterally: Yes Pulse Check Posterior Tibialis and Dorsalis pulse intact bilaterally: Yes Comments         Assessment & Plan:   Physical exam: Screening blood work    ordered Immunizations  tdap today, discussed shingrix Colonoscopy  Up to date   Mammogram  Due - will schedule Gyn  N/a - s/p hysterectomy, has ovaries Eye exams  Up to date - vision works Exercise  None Diet  Not doing great Weight  Encouraged weight loss Substance abuse  none      See Problem List for Assessment and Plan of chronic medical problems.

## 2020-06-06 NOTE — Patient Instructions (Addendum)
Blood work was ordered.    Tetanus given.    Medications changes include :   Stop celexa and start fluoxetine 20 mg daily  Your prescription(s) have been submitted to your pharmacy. Please take as directed and contact our office if you believe you are having problem(s) with the medication(s).   Please followup in 6 months   [place] [facility] [place]  Dr Mel Almond - [phone] Dr Terrance Mass   [phone] Dr Ernestene Mention   [phone]  Sandford Craze - clinical social worker/therapist -  [phone]  [facility] [place]  [address] [phone]  [facility] [place] [phone] Office  [facility]            [place] Maintenance, Female Adopting a healthy lifestyle and getting preventive care are important in promoting health and wellness. Ask your health care provider about:  The right schedule for you to have regular tests and exams.  Things you can do on your own to prevent diseases and keep yourself healthy. What should I know about diet, weight, and exercise? Eat a healthy diet  Eat a diet that includes plenty of vegetables, fruits, low-fat dairy products, and lean protein.  Do not eat a lot of foods that are high in solid fats, added sugars, or sodium.   Maintain a healthy weight Body mass index (BMI) is used to identify weight problems. It estimates body fat based on height and weight. Your health care provider can help determine your BMI and help you achieve or maintain a healthy weight. Get regular exercise Get regular exercise. This is one of the most important things you can do for your health. Most adults should:  Exercise for at least 150 minutes each week. The exercise should  increase your heart rate and make you sweat (moderate-intensity exercise).  Do strengthening exercises at least twice a week. This is in addition to the moderate-intensity exercise.  Spend less time sitting. Even light physical activity can be beneficial. Watch cholesterol and blood lipids Have your blood tested for lipids and cholesterol at 56 years of age, then have this test every 5 years. Have your cholesterol levels checked more often if:  Your lipid or cholesterol levels are high.  You are older than 56 years of age.  You are at high risk for heart disease. What should I know about cancer screening? Depending on your health history and family history, you may need to have cancer screening at various ages. This may include screening for:  Breast cancer.  Cervical cancer.  Colorectal cancer.  Skin cancer.  Lung cancer. What should I know about heart disease, diabetes, and high blood pressure? Blood pressure and heart disease  High blood pressure causes heart disease and increases the risk of stroke. This is more likely to develop in people who have high blood pressure readings, are of African descent, or are overweight.  Have your blood pressure checked: ? Every 3-5 years if you are 8-71 years of age. ? Every year if you are 80 years old or older. Diabetes Have regular diabetes screenings. This checks your fasting blood sugar level. Have the screening done:  Once every three years after age 64 if you are at a normal weight and have a low risk for diabetes.  More  often and at a younger age if you are overweight or have a high risk for diabetes. What should I know about preventing infection? Hepatitis B If you have a higher risk for hepatitis B, you should be screened for this virus. Talk with your health care provider to find out if you are at risk for hepatitis B infection. Hepatitis C Testing is recommended for:  Everyone born from [birth year] through 1966.  Anyone with  known risk factors for hepatitis C. Sexually transmitted infections (STIs)  Get screened for STIs, including gonorrhea and chlamydia, if: ? You are sexually active and are younger than 56 years of age. ? You are older than 56 years of age and your health care provider tells you that you are at risk for this type of infection. ? Your sexual activity has changed since you were last screened, and you are at increased risk for chlamydia or gonorrhea. Ask your health care provider if you are at risk.  Ask your health care provider about whether you are at high risk for HIV. Your health care provider may recommend a prescription medicine to help prevent HIV infection. If you choose to take medicine to prevent HIV, you should first get tested for HIV. You should then be tested every 3 months for as long as you are taking the medicine. Pregnancy  If you are about to stop having your period (premenopausal) and you may become pregnant, seek counseling before you get pregnant.  Take 400 to 800 micrograms (mcg) of folic acid every day if you become pregnant.  Ask for birth control (contraception) if you want to prevent pregnancy. Osteoporosis and menopause Osteoporosis is a disease in which the bones lose minerals and strength with aging. This can result in bone fractures. If you are 72 years old or older, or if you are at risk for osteoporosis and fractures, ask your health care provider if you should:  Be screened for bone loss.  Take a calcium or vitamin D supplement to lower your risk of fractures.  Be given hormone replacement therapy (HRT) to treat symptoms of menopause. Follow these instructions at home: Lifestyle  Do not use any products that contain nicotine or tobacco, such as cigarettes, e-cigarettes, and chewing tobacco. If you need help quitting, ask your health care provider.  Do not use street drugs.  Do not share needles.  Ask your health care provider for help if you need  support or information about quitting drugs. Alcohol use  Do not drink alcohol if: ? Your health care provider tells you not to drink. ? You are pregnant, may be pregnant, or are planning to become pregnant.  If you drink alcohol: ? Limit how much you use to 0-1 drink a day. ? Limit intake if you are breastfeeding.  Be aware of how much alcohol is in your drink. In the [place] one drink equals one 12 oz bottle of beer (355 mL), one 5 oz glass of wine (148 mL), or one 1 oz glass of hard liquor (44 mL). General instructions  Schedule regular health, dental, and eye exams.  Stay current with your vaccines.  Tell your health care provider if: ? You often feel depressed. ? You have ever been abused or do not feel safe at home. Summary  Adopting a healthy lifestyle and getting preventive care are important in promoting health and wellness.  Follow your health care provider's instructions about healthy diet, exercising, and getting tested or screened for diseases.  Follow your  health care provider's instructions on monitoring your cholesterol and blood pressure. This information is not intended to replace advice given to you by your health care provider. Make sure you discuss any questions you have with your health care provider. Document Revised: 12/30/2017 Document Reviewed: 12/30/2017 Elsevier Patient Education  2021 [employer].

## 2020-06-07 ENCOUNTER — Encounter: Payer: Self-pay | Admitting: Internal Medicine

## 2020-06-07 ENCOUNTER — Other Ambulatory Visit: Payer: Self-pay

## 2020-06-07 ENCOUNTER — Ambulatory Visit (INDEPENDENT_AMBULATORY_CARE_PROVIDER_SITE_OTHER): Payer: BC Managed Care – PPO | Admitting: Internal Medicine

## 2020-06-07 VITALS — BP 130/74 | HR 80 | Temp 98.6°F | Ht 64.0 in | Wt 221.8 lb

## 2020-06-07 DIAGNOSIS — K219 Gastro-esophageal reflux disease without esophagitis: Secondary | ICD-10-CM

## 2020-06-07 DIAGNOSIS — Z23 Encounter for immunization: Secondary | ICD-10-CM | POA: Diagnosis not present

## 2020-06-07 DIAGNOSIS — Z0000 Encounter for general adult medical examination without abnormal findings: Secondary | ICD-10-CM | POA: Diagnosis not present

## 2020-06-07 DIAGNOSIS — F5101 Primary insomnia: Secondary | ICD-10-CM | POA: Diagnosis not present

## 2020-06-07 DIAGNOSIS — E119 Type 2 diabetes mellitus without complications: Secondary | ICD-10-CM

## 2020-06-07 DIAGNOSIS — E782 Mixed hyperlipidemia: Secondary | ICD-10-CM | POA: Diagnosis not present

## 2020-06-07 DIAGNOSIS — J4520 Mild intermittent asthma, uncomplicated: Secondary | ICD-10-CM

## 2020-06-07 DIAGNOSIS — F3289 Other specified depressive episodes: Secondary | ICD-10-CM

## 2020-06-07 DIAGNOSIS — F411 Generalized anxiety disorder: Secondary | ICD-10-CM

## 2020-06-07 DIAGNOSIS — F32A Depression, unspecified: Secondary | ICD-10-CM | POA: Insufficient documentation

## 2020-06-07 DIAGNOSIS — Z1159 Encounter for screening for other viral diseases: Secondary | ICD-10-CM

## 2020-06-07 MED ORDER — FLUOXETINE HCL 20 MG PO TABS: 20.0000 mg | ORAL_TABLET | Freq: Every day | ORAL | 3 refills | Status: DC

## 2020-06-07 NOTE — Assessment & Plan Note (Signed)
Chronic Check lipid panel  Continue Crestor 5 mg daily Regular exercise and healthy diet encouraged  

## 2020-06-07 NOTE — Assessment & Plan Note (Signed)
Chronic Diet controlled Discussed possibly starting medication depending on where her A1c is to help keep her sugars controlled and possibly to help with weight loss-she will consider this Stressed weight loss, regular exercise and low sugar/carbohydrate diet Check A1c, urine microalbumin Discussed the importance of yearly eye exam

## 2020-06-07 NOTE — Assessment & Plan Note (Signed)
Chronic Mild, intermittent Usually related to seasonal allergies This year for maintenance inhaler was not covered so she just stayed inside Currently using albuterol as needed and symptoms controlled Will need to consider different maintenance inhaler next year

## 2020-06-07 NOTE — Assessment & Plan Note (Addendum)
New She is feeling depression , PHQ 9 score 13 - moderate depression Will stop Celexa-she has been on this for a long time and she does not feel like it is helping any longer Start fluoxetine 20 mg daily Discussed seeing a therapist-names given.  She can also do this through work

## 2020-06-07 NOTE — Assessment & Plan Note (Signed)
Chronic Intermittent  Has ambien at home 5 mg but has not taken it  Can use ambien 5 mg HS prn

## 2020-06-07 NOTE — Assessment & Plan Note (Signed)
Chronic Intermittent Taking pepcid 20 mg otc prn

## 2020-06-07 NOTE — Assessment & Plan Note (Signed)
Chronic Not ideally controlled Continue clonazepam 0.5 mg - 0.75 mg daily as needed Will stop Celexa-she has been on this for a long time and she does not feel like it is helping any longer Start fluoxetine 20 mg daily Discussed seeing a therapist-names given.  She can also do this through work

## 2020-07-24 ENCOUNTER — Other Ambulatory Visit: Payer: Self-pay | Admitting: Internal Medicine

## 2020-08-27 ENCOUNTER — Other Ambulatory Visit: Payer: Self-pay | Admitting: Internal Medicine

## 2020-09-21 DIAGNOSIS — Z79891 Long term (current) use of opiate analgesic: Secondary | ICD-10-CM | POA: Diagnosis not present

## 2020-09-21 DIAGNOSIS — G894 Chronic pain syndrome: Secondary | ICD-10-CM | POA: Diagnosis not present

## 2020-09-21 DIAGNOSIS — M5030 Other cervical disc degeneration, unspecified cervical region: Secondary | ICD-10-CM | POA: Diagnosis not present

## 2020-09-21 DIAGNOSIS — Z5181 Encounter for therapeutic drug level monitoring: Secondary | ICD-10-CM | POA: Diagnosis not present

## 2020-10-01 ENCOUNTER — Encounter: Payer: Self-pay | Admitting: Internal Medicine

## 2020-11-25 ENCOUNTER — Other Ambulatory Visit: Payer: Self-pay | Admitting: Internal Medicine

## 2020-11-27 ENCOUNTER — Other Ambulatory Visit: Payer: Self-pay | Admitting: Internal Medicine

## 2020-12-10 ENCOUNTER — Encounter: Payer: Self-pay | Admitting: Internal Medicine

## 2020-12-10 DIAGNOSIS — E669 Obesity, unspecified: Secondary | ICD-10-CM | POA: Insufficient documentation

## 2020-12-10 NOTE — Patient Instructions (Addendum)
    Blood work was ordered.      Medications changes include :   none     Please followup in 6 months  

## 2020-12-10 NOTE — Progress Notes (Signed)
Subjective:    Patient ID: Amanda [name], female    DOB: Nov 27, 1965, 55 y.o.   MRN: [id number]  This visit occurred during the SARS-CoV-2 public health emergency.  Safety protocols were in place, including screening questions prior to the visit, additional usage of staff PPE, and extensive cleaning of exam room while observing appropriate contact time as indicated for disinfecting solutions.     HPI The patient is here for follow up of their chronic medical problems, including DM, h/o VT, hld, anxiety/depression, insomnia, obesity, asthma  She has been using advair 250-50 mg and thought it was working but had a feeling of cough in the chest. She stopped the inhaler and was able to cough up a little.  She has more asthma symptoms and went back to using it once a day which helped.   She is not exercising regularly.     Medications and allergies reviewed with patient and updated if appropriate.  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   Diabetes mellitus without complication ([place]) 17/00/1749   Anxiety state 05/26/2014   DDD (degenerative disc disease), cervical 12/16/2012   GERD (gastroesophageal reflux disease) 11/08/2012   H/O ventricular tachycardia 11/08/2012   RBBB 03/29/2012   HERPES ZOSTER 02/11/2010   COLONIC POLYPS, HX OF 02/04/2010   OVARIAN CYST, LEFT 11/17/2008   Mixed hyperlipidemia 09/27/2008   ALLERGIC RHINITIS 09/27/2008   Asthma 09/27/2008    Current Outpatient Medications on File Prior to Visit  Medication Sig Dispense Refill   atenolol (TENORMIN) 50 MG tablet TAKE 1 TABLET BY MOUTH DAILY 90 tablet 0   clonazePAM (KLONOPIN) 0.5 MG tablet TAKE 1 TO 1 AND 1/2 TABLETS BY MOUTH EVERY 8 TO 12 HOURS AS NEEDED 30 tablet 0   FLUoxetine (PROZAC) 20 MG tablet Take 1 tablet (20 mg total) by mouth daily. 90  tablet 3   fluticasone-salmeterol (ADVAIR) 250-50 MCG/ACT AEPB SMARTSIG:1 Puff(s) Via Inhaler Morning-Night     HYDROcodone-acetaminophen (NORCO) 10-325 MG per tablet Take 1 tablet by mouth 5 (five) times daily.      rosuvastatin (CRESTOR) 5 MG tablet TAKE 1 TABLET BY MOUTH DAILY 90 tablet 0   zolpidem (AMBIEN) 5 MG tablet Take 1 tablet (5 mg total) by mouth at bedtime as needed for sleep. 30 tablet 0   albuterol (VENTOLIN HFA) 108 (90 Base) MCG/ACT inhaler INHALE 2 PUFFS BY MOUTH EVERY 4 HOURS AS NEEDED FOR WHEEZING (Patient not taking: Reported on 12/11/2020) 18 g 5   No current facility-administered medications on file prior to visit.    Past Medical History:  Diagnosis Date   Allergy    rhinitis, seasonal   Anxiety    Asthma    extrinsic   GERD (gastroesophageal reflux disease)    Hemorrhoids    Hx of colonic polyps 2010   hyperplastic   Hyperglycemia    borderline   Hyperlipidemia    borderline   Tachycardia, paroxysmal ([place])    Dr Caryl Comes    Past Surgical History:  Procedure Laterality Date   ABDOMINAL HYSTERECTOMY  2004   For Abnormal Pap & painful menses, Dr Deatra Ina   COLONOSCOPY W/ POLYPECTOMY  11/2008   Dr Oletta Lamas   [facility]     X 2; Dr Nelva Bush   UPPER GI ENDOSCOPY  04/2012   esophageal erosions ; Dr Oletta Lamas  WISDOM TOOTH EXTRACTION  02/13/2009    Social History   Socioeconomic History   Marital status: Married    Spouse name: Not on file   Number of children: Not on file   Years of education: Not on file   Highest education level: Not on file  Occupational History   Not on file  Tobacco Use   Smoking status: Former    Types: Cigarettes    Quit date: 12/25/2011    Years since quitting: 8.9   Smokeless tobacco: Never   Tobacco comments:     smoked 1982-2013,less than 1 pack a day   Substance and Sexual Activity   Alcohol use: Yes    Comment: Rarely   Drug use: No   Sexual activity: Yes    Birth control/protection: None  Other Topics Concern   Not on  file  Social History Narrative   Regular exercise- no   Social Determinants of Health   Financial Resource Strain: Not on file  Food Insecurity: Not on file  Transportation Needs: Not on file  Physical Activity: Not on file  Stress: Not on file  Social Connections: Not on file    Family History  Problem Relation Age of Onset   Colon cancer Father        Stage 4   Diabetes Father    Coronary artery disease Mother        angioplasty in late 84s   Hyperlipidemia Mother    Depression Brother    Bipolar disorder Brother    Diabetes Brother    Colon cancer Paternal Uncle    Lung cancer Paternal Grandfather    Colon cancer Paternal Grandfather    Breast cancer Other        MGAunts   Stroke Neg Hx     Review of Systems  Constitutional:  Negative for chills and fever.  Respiratory:  Positive for shortness of breath and wheezing (occ - asthma). Negative for cough.   Cardiovascular:  Positive for chest pain (occ chest pain across chest). Negative for palpitations and leg swelling.  Neurological:  Negative for light-headedness and headaches.      Objective:   Vitals:   12/11/20 0940  BP: 120/70  Pulse: 71  Temp: 98.2 F (36.8 C)  SpO2: 94%   BP Readings from Last 3 Encounters:  12/11/20 120/70  06/07/20 130/74  12/02/19 130/74   Wt Readings from Last 3 Encounters:  12/11/20 221 lb 3.2 oz (100.3 kg)  06/07/20 221 lb 12.8 oz (100.6 kg)  12/02/19 224 lb (101.6 kg)   Body mass index is 39.18 kg/m.   Physical Exam    Constitutional: Appears well-developed and well-nourished. No distress.  HENT:  Head: Normocephalic and atraumatic.  Neck: Neck supple. No tracheal deviation present. No thyromegaly present.  No cervical lymphadenopathy Cardiovascular: Normal rate, regular rhythm and normal heart sounds.   No murmur heard. No carotid bruit .  No edema Pulmonary/Chest: Effort normal and breath sounds normal. No respiratory distress. No has no wheezes. No rales.   Skin: Skin is warm and dry. Not diaphoretic.  Psychiatric: Normal mood and affect. Behavior is normal.      Assessment & Plan:    See Problem List for Assessment and Plan of chronic medical problems.

## 2020-12-11 ENCOUNTER — Encounter: Payer: Self-pay | Admitting: Internal Medicine

## 2020-12-11 ENCOUNTER — Ambulatory Visit: Payer: BC Managed Care – PPO | Admitting: Internal Medicine

## 2020-12-11 ENCOUNTER — Other Ambulatory Visit: Payer: Self-pay

## 2020-12-11 VITALS — BP 120/70 | HR 71 | Temp 98.2°F | Ht 63.0 in | Wt 221.2 lb

## 2020-12-11 DIAGNOSIS — E782 Mixed hyperlipidemia: Secondary | ICD-10-CM

## 2020-12-11 DIAGNOSIS — E119 Type 2 diabetes mellitus without complications: Secondary | ICD-10-CM | POA: Diagnosis not present

## 2020-12-11 DIAGNOSIS — F3289 Other specified depressive episodes: Secondary | ICD-10-CM

## 2020-12-11 DIAGNOSIS — Z8679 Personal history of other diseases of the circulatory system: Secondary | ICD-10-CM | POA: Diagnosis not present

## 2020-12-11 DIAGNOSIS — Z136 Encounter for screening for cardiovascular disorders: Secondary | ICD-10-CM

## 2020-12-11 DIAGNOSIS — F5101 Primary insomnia: Secondary | ICD-10-CM | POA: Diagnosis not present

## 2020-12-11 DIAGNOSIS — F411 Generalized anxiety disorder: Secondary | ICD-10-CM

## 2020-12-11 DIAGNOSIS — J4520 Mild intermittent asthma, uncomplicated: Secondary | ICD-10-CM | POA: Diagnosis not present

## 2020-12-11 DIAGNOSIS — E669 Obesity, unspecified: Secondary | ICD-10-CM

## 2020-12-11 LAB — COMPREHENSIVE METABOLIC PANEL
ALT: 22 U/L (ref 0–35)
AST: 23 U/L (ref 0–37)
Albumin: 4.3 g/dL (ref 3.5–5.2)
Alkaline Phosphatase: 71 U/L (ref 39–117)
BUN: 9 mg/dL (ref 6–23)
CO2: 34 mEq/L — ABNORMAL HIGH (ref 19–32)
Calcium: 9.6 mg/dL (ref 8.4–10.5)
Chloride: 102 mEq/L (ref 96–112)
Creatinine, Ser: 0.83 mg/dL (ref 0.40–1.20)
GFR: 79 mL/min (ref >60.00)
Glucose, Bld: 103 mg/dL — ABNORMAL HIGH (ref 70–99)
Potassium: 4.6 mEq/L (ref 3.5–5.1)
Sodium: 141 mEq/L (ref 135–145)
Total Bilirubin: 0.5 mg/dL (ref 0.2–1.2)
Total Protein: 7.4 g/dL (ref 6.0–8.3)

## 2020-12-11 LAB — MICROALBUMIN / CREATININE URINE RATIO
Creatinine,U: 230.4 mg/dL
Microalb Creat Ratio: 1.3 mg/g (ref 0.0–30.0)
Microalb, Ur: 3 mg/dL — ABNORMAL HIGH (ref 0.0–1.9)

## 2020-12-11 LAB — LIPID PANEL
Cholesterol: 178 mg/dL (ref 0–200)
HDL: 52.7 mg/dL (ref >39.00)
LDL Cholesterol: 98 mg/dL (ref 0–99)
NonHDL: 125.19
Total CHOL/HDL Ratio: 3
Triglycerides: 134 mg/dL (ref 0.0–149.0)
VLDL: 26.8 mg/dL (ref 0.0–40.0)

## 2020-12-11 LAB — HEMOGLOBIN A1C: Hgb A1c MFr Bld: 6.5 % (ref 4.6–6.5)

## 2020-12-11 MED ORDER — TRELEGY ELLIPTA 100-62.5-25 MCG/ACT IN AEPB: 1.0000 | INHALATION_SPRAY | Freq: Every day | RESPIRATORY_TRACT | 11 refills | Status: DC

## 2020-12-11 NOTE — Assessment & Plan Note (Signed)
Chronic Does not sleep well - does not want to take medication She does ok but does not get good sleep Has ambien 5 mg - she can take if needed

## 2020-12-11 NOTE — Assessment & Plan Note (Signed)
Chronic Controlled, stable Continue prozac 20 mg daily, clonazepam 0.25 mg daily prn - does not take often

## 2020-12-11 NOTE — Assessment & Plan Note (Signed)
Chronic Has been controlled Currently diet controlled Will check A1c today Stressed diabetic diet, regular exercise and weight loss Discussed that if her A1c is less than 7% she does not necessarily need to go on medication, but can consider Rybelsus or Ozempic if covered to help aid in weight loss which will also help control her sugars

## 2020-12-11 NOTE — Assessment & Plan Note (Signed)
She has been experiencing some atypical chest pain that could be from her asthma, heartburn or less likely cardiac in nature She does have a family history and has risk factors for heart disease Discussed seeing a cardiologist or doing a CT coronary artery calcium score, which is what she would like to do I think this is reasonable given her risk factors and family history Cardiac scoring CT ordered

## 2020-12-11 NOTE — Addendum Note (Signed)
Addended by: Marcina Millard on: 12/11/2020 11:42 AM   Modules accepted: Orders

## 2020-12-11 NOTE — Assessment & Plan Note (Signed)
Chronic Controlled, stable Continue atenolol 50 mg daily

## 2020-12-11 NOTE — Assessment & Plan Note (Signed)
Chronic Encouraged weight loss Discussed considering a diabetic medication that would help control her sugars and also aid in weight loss-she will think about this Stressed regular exercise, healthy diet decrease portions

## 2020-12-11 NOTE — Assessment & Plan Note (Signed)
Chronic Controlled, stable Continue prozac 20 mg daily  

## 2020-12-11 NOTE — Assessment & Plan Note (Signed)
Chronic ?  Controlled or not When she takes the Advair twice daily she feels her asthma is well controlled, but I feel like there is question whether this is the best inhaler for her and not-she has tried several inhalers in the past and its hard to say which one helped the most We have Trelegy here and I will give her a sample to see if this is helpful-she can compare this to the Advair twice a day She does have persistent asthma and needs to be taking the maintenance inhaler on a daily basis Continue albuterol as needed If asthma symptoms are not controlled can consider referring back to pulmonary who she has seen in the past

## 2020-12-11 NOTE — Assessment & Plan Note (Signed)
Chronic Regular exercise and healthy diet encouraged Check lipid panel  Continue rosuvastatin 5 mg daily 

## 2021-02-22 ENCOUNTER — Other Ambulatory Visit: Payer: Self-pay | Admitting: Internal Medicine

## 2021-03-13 ENCOUNTER — Encounter: Payer: Self-pay | Admitting: Internal Medicine

## 2021-03-13 NOTE — Progress Notes (Signed)
Subjective:    Patient ID: Amanda [name], female    DOB: 1965/04/04, 56 y.o.   MRN: [id number]  This visit occurred during the SARS-CoV-2 public health emergency.  Safety protocols were in place, including screening questions prior to the visit, additional usage of staff PPE, and extensive cleaning of exam room while observing appropriate contact time as indicated for disinfecting solutions.    HPI The patient is here for an acute visit.  Rash - it started last weekend and it on her right torso-just underneath her breast.  She suddenly has symptoms and she was concerned it may be shingles.  The rash itches, hurts and is sensitive to touch.  It was very red initially.  She tried triamcinolone cream and it helped a little.  The rash is less red, but still itches, hurts and is sensitive.  The rash was primarily on the chest wall and the erythema on the underside of the breast.  The rash or which she thought was blisters are no longer present.  She states she does sweat a lot at night.  She denies any fevers, chills.  She denies any symptoms around her side to the back.   Medications and allergies reviewed with patient and updated if appropriate.  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   Diabetes mellitus without complication ([facility]) 44/81/8563   Anxiety state 05/26/2014   DDD (degenerative disc disease), cervical 12/16/2012   GERD (gastroesophageal reflux disease) 11/08/2012   H/O ventricular tachycardia 11/08/2012   RBBB 03/29/2012   HERPES ZOSTER 02/11/2010   COLONIC POLYPS, HX OF 02/04/2010   OVARIAN CYST, LEFT 11/17/2008   Mixed hyperlipidemia 09/27/2008   ALLERGIC RHINITIS 09/27/2008   Asthma 09/27/2008    Current Outpatient Medications on  File Prior to Visit  Medication Sig Dispense Refill   albuterol (VENTOLIN HFA) 108 (90 Base) MCG/ACT inhaler INHALE 2 PUFFS BY MOUTH EVERY 4 HOURS AS NEEDED FOR WHEEZING 18 g 5   atenolol (TENORMIN) 50 MG tablet TAKE 1 TABLET BY MOUTH DAILY 90 tablet 0   clonazePAM (KLONOPIN) 0.5 MG tablet TAKE 1 TO 1 AND 1/2 TABLETS BY MOUTH EVERY 8 TO 12 HOURS AS NEEDED 30 tablet 0   FLUoxetine (PROZAC) 20 MG tablet Take 1 tablet (20 mg total) by mouth daily. 90 tablet 3   fluticasone-salmeterol (ADVAIR) 250-50 MCG/ACT AEPB SMARTSIG:1 Puff(s) Via Inhaler Morning-Night     Fluticasone-Umeclidin-Vilant (TRELEGY ELLIPTA) 100-62.5-25 MCG/ACT AEPB Inhale 1 puff into the lungs daily. 1 each 11   HYDROcodone-acetaminophen (NORCO) 10-325 MG per tablet Take 1 tablet by mouth 5 (five) times daily.      rosuvastatin (CRESTOR) 5 MG tablet TAKE 1 TABLET BY MOUTH DAILY 90 tablet 0   zolpidem (AMBIEN) 5 MG tablet Take 1 tablet (5 mg total) by mouth at bedtime as needed for sleep. 30 tablet 0   No current facility-administered medications on file prior to visit.    Past Medical History:  Diagnosis Date   Allergy    rhinitis, seasonal   Anxiety    Asthma    extrinsic   GERD (gastroesophageal reflux disease)    Hemorrhoids    Hx of colonic polyps 2010   hyperplastic   Hyperglycemia    borderline   Hyperlipidemia    borderline  Tachycardia, paroxysmal (HCC)    Dr Caryl Comes    Past Surgical History:  Procedure Laterality Date   ABDOMINAL HYSTERECTOMY  2004   For Abnormal Pap & painful menses, Dr Deatra Ina   COLONOSCOPY W/ POLYPECTOMY  11/2008   Dr Oletta Lamas   ESI     X 2; Dr Nelva Bush   UPPER GI ENDOSCOPY  04/2012   esophageal erosions ; Dr Skip Mayer TOOTH EXTRACTION  02/13/2009    Social History   Socioeconomic History   Marital status: Married    Spouse name: Not on file   Number of children: Not on file   Years of education: Not on file   Highest education level: Not on file  Occupational History    Not on file  Tobacco Use   Smoking status: Former    Types: Cigarettes    Quit date: 12/25/2011    Years since quitting: 9.2   Smokeless tobacco: Never   Tobacco comments:     smoked 1982-2013,less than 1 pack a day   Substance and Sexual Activity   Alcohol use: Yes    Comment: Rarely   Drug use: No   Sexual activity: Yes    Birth control/protection: None  Other Topics Concern   Not on file  Social History Narrative   Regular exercise- no   Social Determinants of Health   Financial Resource Strain: Not on file  Food Insecurity: Not on file  Transportation Needs: Not on file  Physical Activity: Not on file  Stress: Not on file  Social Connections: Not on file    Family History  Problem Relation Age of Onset   Colon cancer Father        Stage 4   Diabetes Father    Coronary artery disease Mother        angioplasty in late 27s   Hyperlipidemia Mother    Depression Brother    Bipolar disorder Brother    Diabetes Brother    Colon cancer Paternal Uncle    Lung cancer Paternal Grandfather    Colon cancer Paternal Grandfather    Breast cancer Other        MGAunts   Stroke Neg Hx     Review of Systems  Constitutional:  Negative for chills and fever.  Skin:  Positive for rash.      Objective:   Vitals:   03/14/21 0830  BP: 128/80  Pulse: 70  Temp: 98.1 F (36.7 C)  SpO2: 95%   BP Readings from Last 3 Encounters:  03/14/21 128/80  12/11/20 120/70  06/07/20 130/74   Wt Readings from Last 3 Encounters:  03/14/21 215 lb (97.5 kg)  12/11/20 221 lb 3.2 oz (100.3 kg)  06/07/20 221 lb 12.8 oz (100.6 kg)   Body mass index is 38.09 kg/m.   Physical Exam Constitutional:      General: She is not in acute distress.    Appearance: Normal appearance. She is not ill-appearing.  HENT:     Head: Normocephalic and atraumatic.  Skin:    General: Skin is warm and dry.     Findings: Erythema (Mild generalized erythema under right breast medial erythema on chest  wall under the breast-no blisters, papules for breaks in skin) present.  Neurological:     Mental Status: She is alert.           Assessment & Plan:    Recommended shingles vaccine  See Problem List for Assessment and Plan of chronic medical problems.

## 2021-03-14 ENCOUNTER — Ambulatory Visit: Payer: BC Managed Care – PPO | Admitting: Internal Medicine

## 2021-03-14 ENCOUNTER — Encounter: Payer: Self-pay | Admitting: Internal Medicine

## 2021-03-14 ENCOUNTER — Other Ambulatory Visit: Payer: Self-pay

## 2021-03-14 DIAGNOSIS — L304 Erythema intertrigo: Secondary | ICD-10-CM

## 2021-03-14 MED ORDER — CICLOPIROX OLAMINE 0.77 % EX CREA: TOPICAL_CREAM | Freq: Two times a day (BID) | CUTANEOUS | 0 refills | Status: DC

## 2021-03-14 NOTE — Patient Instructions (Addendum)
° ° °  Medications changes include :   antifungal cream   Your prescription(s) have been sent to your pharmacy.     Return if symptoms worsen or fail to improve.

## 2021-03-14 NOTE — Assessment & Plan Note (Signed)
Acute Rash consistent with intertrigo-not shingles Triamcinolone cream that she has at home twice daily has been helping and she will continue that Advised to try to keep the area dry We will add ciclopirox 0.77% cream twice daily since there is likely a yeast component She will let me know if there is no improvement

## 2021-04-12 ENCOUNTER — Other Ambulatory Visit: Payer: Self-pay | Admitting: Internal Medicine

## 2021-04-12 DIAGNOSIS — F411 Generalized anxiety disorder: Secondary | ICD-10-CM

## 2021-04-18 ENCOUNTER — Telehealth: Payer: Self-pay | Admitting: Internal Medicine

## 2021-04-18 NOTE — Telephone Encounter (Signed)
Connected to [facility] 3.25.2023. ? ?Caller states she has a bridge in her mouth. She has something caught under it and her mouth is swelling that has extended into face. Denies fever or chills. ? ?Advised to see HCP within 4 hours.  ?

## 2021-05-21 ENCOUNTER — Other Ambulatory Visit: Payer: Self-pay | Admitting: Internal Medicine

## 2021-05-24 ENCOUNTER — Other Ambulatory Visit: Payer: Self-pay | Admitting: Internal Medicine

## 2021-06-04 DIAGNOSIS — M5030 Other cervical disc degeneration, unspecified cervical region: Secondary | ICD-10-CM | POA: Diagnosis not present

## 2021-06-04 DIAGNOSIS — G894 Chronic pain syndrome: Secondary | ICD-10-CM | POA: Diagnosis not present

## 2021-06-10 ENCOUNTER — Encounter: Payer: Self-pay | Admitting: Internal Medicine

## 2021-06-10 NOTE — Patient Instructions (Addendum)
Blood work was ordered.     Medications changes include :  increase prozac to 40 mg daily    Your prescription(s) have been sent to your pharmacy.    A referral was ordered for therapist.     Someone will call you to schedule an appointment.    Return in about 4 weeks (around 07/09/2021) for follow up.    Health Maintenance, Female Adopting a healthy lifestyle and getting preventive care are important in promoting health and wellness. Ask your health care provider about: The right schedule for you to have regular tests and exams. Things you can do on your own to prevent diseases and keep yourself healthy. What should I know about diet, weight, and exercise? Eat a healthy diet  Eat a diet that includes plenty of vegetables, fruits, low-fat dairy products, and lean protein. Do not eat a lot of foods that are high in solid fats, added sugars, or sodium. Maintain a healthy weight Body mass index (BMI) is used to identify weight problems. It estimates body fat based on height and weight. Your health care provider can help determine your BMI and help you achieve or maintain a healthy weight. Get regular exercise Get regular exercise. This is one of the most important things you can do for your health. Most adults should: Exercise for at least 150 minutes each week. The exercise should increase your heart rate and make you sweat (moderate-intensity exercise). Do strengthening exercises at least twice a week. This is in addition to the moderate-intensity exercise. Spend less time sitting. Even light physical activity can be beneficial. Watch cholesterol and blood lipids Have your blood tested for lipids and cholesterol at 57 years of age, then have this test every 5 years. Have your cholesterol levels checked more often if: Your lipid or cholesterol levels are high. You are older than 57 years of age. You are at high risk for heart disease. What should I know about cancer  screening? Depending on your health history and family history, you may need to have cancer screening at various ages. This may include screening for: Breast cancer. Cervical cancer. Colorectal cancer. Skin cancer. Lung cancer. What should I know about heart disease, diabetes, and high blood pressure? Blood pressure and heart disease High blood pressure causes heart disease and increases the risk of stroke. This is more likely to develop in people who have high blood pressure readings or are overweight. Have your blood pressure checked: Every 3-5 years if you are 64-65 years of age. Every year if you are 46 years old or older. Diabetes Have regular diabetes screenings. This checks your fasting blood sugar level. Have the screening done: Once every three years after age 31 if you are at a normal weight and have a low risk for diabetes. More often and at a younger age if you are overweight or have a high risk for diabetes. What should I know about preventing infection? Hepatitis B If you have a higher risk for hepatitis B, you should be screened for this virus. Talk with your health care provider to find out if you are at risk for hepatitis B infection. Hepatitis C Testing is recommended for: Everyone born from [birth year] through 1966. Anyone with known risk factors for hepatitis C. Sexually transmitted infections (STIs) Get screened for STIs, including gonorrhea and chlamydia, if: You are sexually active and are younger than 57 years of age. You are older than 57 years of age and your health care  provider tells you that you are at risk for this type of infection. Your sexual activity has changed since you were last screened, and you are at increased risk for chlamydia or gonorrhea. Ask your health care provider if you are at risk. Ask your health care provider about whether you are at high risk for HIV. Your health care provider may recommend a prescription medicine to help prevent HIV  infection. If you choose to take medicine to prevent HIV, you should first get tested for HIV. You should then be tested every 3 months for as long as you are taking the medicine. Pregnancy If you are about to stop having your period (premenopausal) and you may become pregnant, seek counseling before you get pregnant. Take 400 to 800 micrograms (mcg) of folic acid every day if you become pregnant. Ask for birth control (contraception) if you want to prevent pregnancy. Osteoporosis and menopause Osteoporosis is a disease in which the bones lose minerals and strength with aging. This can result in bone fractures. If you are 60 years old or older, or if you are at risk for osteoporosis and fractures, ask your health care provider if you should: Be screened for bone loss. Take a calcium or vitamin D supplement to lower your risk of fractures. Be given hormone replacement therapy (HRT) to treat symptoms of menopause. Follow these instructions at home: Alcohol use Do not drink alcohol if: Your health care provider tells you not to drink. You are pregnant, may be pregnant, or are planning to become pregnant. If you drink alcohol: Limit how much you have to: 0-1 drink a day. Know how much alcohol is in your drink. In the [place] one drink equals one 12 oz bottle of beer (355 mL), one 5 oz glass of wine (148 mL), or one 1 oz glass of hard liquor (44 mL). Lifestyle Do not use any products that contain nicotine or tobacco. These products include cigarettes, chewing tobacco, and vaping devices, such as e-cigarettes. If you need help quitting, ask your health care provider. Do not use street drugs. Do not share needles. Ask your health care provider for help if you need support or information about quitting drugs. General instructions Schedule regular health, dental, and eye exams. Stay current with your vaccines. Tell your health care provider if: You often feel depressed. You have ever been abused  or do not feel safe at home. Summary Adopting a healthy lifestyle and getting preventive care are important in promoting health and wellness. Follow your health care provider's instructions about healthy diet, exercising, and getting tested or screened for diseases. Follow your health care provider's instructions on monitoring your cholesterol and blood pressure. This information is not intended to replace advice given to you by your health care provider. Make sure you discuss any questions you have with your health care provider. Document Revised: 05/28/2020 Document Reviewed: 05/28/2020 Elsevier Patient Education  [place].

## 2021-06-10 NOTE — Progress Notes (Unsigned)
Subjective:    Patient ID: Amanda [name], female    DOB: 10-Dec-1965, 56 y.o.   MRN: [id number]      HPI [facility] is here for  Chief Complaint  Patient presents with   Annual Exam     She has both depression and anxiety - both are worse.  She can go a week w/o showering-has no motivation.  She has been working every day, but does not have any motivation to do much else to not.  She eats, works, sleeps.        Medications and allergies reviewed with patient and updated if appropriate.  Current Outpatient Medications on File Prior to Visit  Medication Sig Dispense Refill   albuterol (VENTOLIN HFA) 108 (90 Base) MCG/ACT inhaler INHALE 2 PUFFS BY MOUTH EVERY 4 HOURS AS NEEDED FOR WHEEZING 18 g 5   atenolol (TENORMIN) 50 MG tablet TAKE 1 TABLET BY MOUTH DAILY 90 tablet 0   clonazePAM (KLONOPIN) 0.5 MG tablet TAKE 1 TO 1 AND 1/2 TABLETS BY MOUTH EVERY 8 TO 12 HOURS AS NEEDED 30 tablet 1   HYDROcodone-acetaminophen (NORCO) 10-325 MG per tablet Take 1 tablet by mouth 5 (five) times daily.      rosuvastatin (CRESTOR) 5 MG tablet TAKE 1 TABLET BY MOUTH DAILY 90 tablet 0   zolpidem (AMBIEN) 5 MG tablet Take 1 tablet (5 mg total) by mouth at bedtime as needed for sleep. 30 tablet 0   No current facility-administered medications on file prior to visit.    Review of Systems  Constitutional:  Negative for fever.  Eyes:  Negative for visual disturbance.  Respiratory:  Positive for shortness of breath (with moderate exertion) and wheezing. Negative for cough.   Cardiovascular:  Positive for chest pain (related to anxiety). Negative for palpitations (related to anxiety) and leg swelling.  Gastrointestinal:  Negative for abdominal pain, blood in stool, constipation, diarrhea and nausea.       Gerd controlled with otc medication  Genitourinary:  Negative for dysuria.  Musculoskeletal:  Positive for arthralgias. Negative for back pain.  Skin:  Positive for rash (intermittent under breasts).   Neurological:  Negative for light-headedness and headaches (occ).  Psychiatric/Behavioral:  Positive for dysphoric mood. The patient is nervous/anxious.       Objective:   Vitals:   06/11/21 0909  BP: 120/78  Pulse: 72  Temp: 98.4 F (36.9 C)  SpO2: 92%   Filed Weights   06/11/21 0909  Weight: 216 lb (98 kg)   Body mass index is 38.26 kg/m.  BP Readings from Last 3 Encounters:  06/11/21 120/78  03/14/21 128/80  12/11/20 120/70    Wt Readings from Last 3 Encounters:  06/11/21 216 lb (98 kg)  03/14/21 215 lb (97.5 kg)  12/11/20 221 lb 3.2 oz (100.3 kg)       03/14/2021    4:33 PM 12/11/2020    9:44 AM 06/07/2020   12:08 PM 06/01/2019    1:50 PM 03/15/2018    9:48 AM  Depression screen PHQ 2/9  Decreased Interest 0 0 2 0 0  Down, Depressed, Hopeless 0 0 2 0 0  PHQ - 2 Score 0 0 4 0 0  Altered sleeping   3  0  Tired, decreased energy   2  0  Change in appetite   2  0  Feeling bad or failure about yourself    2  0  Trouble concentrating   0  0  Moving slowly  or fidgety/restless   0  0  Suicidal thoughts   0  0  PHQ-9 Score   13  0  Difficult doing work/chores   Somewhat difficult          06/07/2020   12:08 PM  GAD 7 : Generalized Anxiety Score  Nervous, Anxious, on Edge 1  Control/stop worrying 0  Worry too much - different things 0  Trouble relaxing 1  Restless 0  Easily annoyed or irritable 1  Afraid - awful might happen 1  Total GAD 7 Score 4  Anxiety Difficulty Somewhat difficult        Physical Exam Constitutional: She appears well-developed and well-nourished. No distress.  HENT:  Head: Normocephalic and atraumatic.  Right Ear: External ear normal. Normal ear canal and TM Left Ear: External ear normal.  Normal ear canal and TM Mouth/Throat: Oropharynx is clear and moist.  Eyes: Conjunctivae normal.  Neck: Neck supple. No tracheal deviation present. No thyromegaly present.  No carotid bruit  Cardiovascular: Normal rate, regular rhythm  and normal heart sounds.   No murmur heard.  No edema. Pulmonary/Chest: Effort normal and breath sounds normal. No respiratory distress. She has no wheezes. She has no rales.  Breast: deferred   Abdominal: Soft. She exhibits no distension. There is no tenderness.  Lymphadenopathy: She has no cervical adenopathy.  Skin: Skin is warm and dry. She is not diaphoretic.  Psychiatric: She has a depressed mood and affect. Her behavior is normal.     Lab Results  Component Value Date   WBC 10.1 03/01/2019   HGB 14.2 03/01/2019   HCT 42.0 03/01/2019   PLT 341.0 03/01/2019   GLUCOSE 103 (H) 12/11/2020   CHOL 178 12/11/2020   TRIG 134.0 12/11/2020   HDL 52.70 12/11/2020   LDLDIRECT 202.9 11/03/2012   LDLCALC 98 12/11/2020   ALT 22 12/11/2020   AST 23 12/11/2020   NA 141 12/11/2020   K 4.6 12/11/2020   CL 102 12/11/2020   CREATININE 0.83 12/11/2020   BUN 9 12/11/2020   CO2 34 (H) 12/11/2020   TSH 0.55 12/03/2016   HGBA1C 6.5 12/11/2020   MICROALBUR 3.0 (H) 12/11/2020         Assessment & Plan:   Physical exam: Screening blood work  ordered Exercise  none  - will try to start walking again Weight  encouraged weight loss Substance abuse  none   Reviewed recommended immunizations.  Has eye appt scheduled Due for mammogram - advised to schedule   Health Maintenance  Topic Date Due   OPHTHALMOLOGY EXAM  Never done   MAMMOGRAM  Never done   Zoster Vaccines- Shingrix (1 of 2) Never done   FOOT EXAM  06/07/2021   HEMOGLOBIN A1C  06/10/2021   COVID-19 Vaccine (5 - Booster for Carrizo series) 06/27/2021 (Originally 11/24/2020)   INFLUENZA VACCINE  08/20/2021   URINE MICROALBUMIN  12/11/2021   COLONOSCOPY (Pts 45-5yr Insurance coverage will need to be confirmed)  11/13/2024   TETANUS/TDAP  06/08/2030   HIV Screening  Completed   HPV VACCINES  Aged Out   Hepatitis C Screening  Discontinued          See Problem List for Assessment and Plan of chronic medical  problems.

## 2021-06-11 ENCOUNTER — Ambulatory Visit (INDEPENDENT_AMBULATORY_CARE_PROVIDER_SITE_OTHER): Payer: BC Managed Care – PPO | Admitting: Internal Medicine

## 2021-06-11 ENCOUNTER — Other Ambulatory Visit: Payer: Self-pay

## 2021-06-11 VITALS — BP 120/78 | HR 72 | Temp 98.4°F | Ht 63.0 in | Wt 216.0 lb

## 2021-06-11 DIAGNOSIS — E119 Type 2 diabetes mellitus without complications: Secondary | ICD-10-CM

## 2021-06-11 DIAGNOSIS — F3289 Other specified depressive episodes: Secondary | ICD-10-CM | POA: Diagnosis not present

## 2021-06-11 DIAGNOSIS — E782 Mixed hyperlipidemia: Secondary | ICD-10-CM

## 2021-06-11 DIAGNOSIS — F411 Generalized anxiety disorder: Secondary | ICD-10-CM

## 2021-06-11 DIAGNOSIS — Z0000 Encounter for general adult medical examination without abnormal findings: Secondary | ICD-10-CM

## 2021-06-11 DIAGNOSIS — F5101 Primary insomnia: Secondary | ICD-10-CM | POA: Diagnosis not present

## 2021-06-11 DIAGNOSIS — Z8679 Personal history of other diseases of the circulatory system: Secondary | ICD-10-CM

## 2021-06-11 LAB — HEMOGLOBIN A1C: Hgb A1c MFr Bld: 6.4 % (ref 4.6–6.5)

## 2021-06-11 LAB — COMPREHENSIVE METABOLIC PANEL
ALT: 26 U/L (ref 0–35)
AST: 23 U/L (ref 0–37)
Albumin: 4.4 g/dL (ref 3.5–5.2)
Alkaline Phosphatase: 70 U/L (ref 39–117)
BUN: 10 mg/dL (ref 6–23)
CO2: 34 mEq/L — ABNORMAL HIGH (ref 19–32)
Calcium: 9.7 mg/dL (ref 8.4–10.5)
Chloride: 102 mEq/L (ref 96–112)
Creatinine, Ser: 0.71 mg/dL (ref 0.40–1.20)
GFR: 94.95 mL/min (ref >60.00)
Glucose, Bld: 89 mg/dL (ref 70–99)
Potassium: 4.4 mEq/L (ref 3.5–5.1)
Sodium: 140 mEq/L (ref 135–145)
Total Bilirubin: 0.4 mg/dL (ref 0.2–1.2)
Total Protein: 7.5 g/dL (ref 6.0–8.3)

## 2021-06-11 LAB — CBC WITH DIFFERENTIAL/PLATELET
Basophils Absolute: 0.1 10*3/uL (ref 0.0–0.1)
Basophils Relative: 0.7 % (ref 0.0–3.0)
Eosinophils Absolute: 0.3 10*3/uL (ref 0.0–0.7)
Eosinophils Relative: 4.1 % (ref 0.0–5.0)
HCT: 44.7 % (ref 36.0–46.0)
Hemoglobin: 15 g/dL (ref 12.0–15.0)
Lymphocytes Relative: 24.4 % (ref 12.0–46.0)
Lymphs Abs: 1.8 10*3/uL (ref 0.7–4.0)
MCHC: 33.7 g/dL (ref 30.0–36.0)
MCV: 91.2 fl (ref 78.0–100.0)
Monocytes Absolute: 0.4 10*3/uL (ref 0.1–1.0)
Monocytes Relative: 5.1 % (ref 3.0–12.0)
Neutro Abs: 4.8 10*3/uL (ref 1.4–7.7)
Neutrophils Relative %: 65.7 % (ref 43.0–77.0)
Platelets: 280 10*3/uL (ref 150.0–400.0)
RBC: 4.9 Mil/uL (ref 3.87–5.11)
RDW: 14.4 % (ref 11.5–15.5)
WBC: 7.3 10*3/uL (ref 4.0–10.5)

## 2021-06-11 LAB — LIPID PANEL
Cholesterol: 181 mg/dL (ref 0–200)
HDL: 54.1 mg/dL (ref >39.00)
LDL Cholesterol: 100 mg/dL — ABNORMAL HIGH (ref 0–99)
NonHDL: 126.43
Total CHOL/HDL Ratio: 3
Triglycerides: 133 mg/dL (ref 0.0–149.0)
VLDL: 26.6 mg/dL (ref 0.0–40.0)

## 2021-06-11 LAB — TSH: TSH: 0.48 u[IU]/mL (ref 0.35–5.50)

## 2021-06-11 MED ORDER — FLUOXETINE HCL 40 MG PO CAPS: 40.0000 mg | ORAL_CAPSULE | Freq: Every day | ORAL | 3 refills | Status: DC

## 2021-06-11 MED ORDER — FLUTICASONE-SALMETEROL 250-50 MCG/ACT IN AEPB: INHALATION_SPRAY | RESPIRATORY_TRACT | 5 refills | Status: DC

## 2021-06-11 NOTE — Assessment & Plan Note (Signed)
Chronic Regular exercise and healthy diet encouraged Check lipid panel  Continue rosuvastatin 5 mg daily 

## 2021-06-11 NOTE — Assessment & Plan Note (Signed)
Chronic Takes Ambien 5 mg nightly as needed only

## 2021-06-11 NOTE — Assessment & Plan Note (Signed)
Chronic Follows with cardiology Controlled Continue metoprolol 50 mg daily

## 2021-06-11 NOTE — Assessment & Plan Note (Addendum)
Chronic Not controlled Check TSH Increase Prozac 40 mg daily Referral ordered for psychology Follow-up in 4 weeks

## 2021-06-11 NOTE — Assessment & Plan Note (Signed)
Chronic Lab Results  Component Value Date   HGBA1C 6.5 12/11/2020   Sugars well controlled Check A1c Continue lifestyle control Stressed regular exercise, diabetic diet

## 2021-06-11 NOTE — Assessment & Plan Note (Addendum)
Chronic Not controlled Check TSH Continue clonazepam 0.5 mg twice daily as needed Increase prozac to 40 mg daily Referral ordered for psychology Follow-up in 4 weeks

## 2021-06-19 DIAGNOSIS — Z0489 Encounter for examination and observation for other specified reasons: Secondary | ICD-10-CM | POA: Diagnosis not present

## 2021-06-25 DIAGNOSIS — K029 Dental caries, unspecified: Secondary | ICD-10-CM | POA: Diagnosis not present

## 2021-07-08 ENCOUNTER — Encounter: Payer: Self-pay | Admitting: Internal Medicine

## 2021-07-08 NOTE — Progress Notes (Deleted)
      Subjective:    Patient ID: Amanda [name], female    DOB: 06-Mar-1965, 56 y.o.   MRN: [id number]     HPI Amanda [name] is here for follow up of her chronic medical problems, including depression, anxiety.    4 weeks ago we increased her Prozac dose to 40 mg daily.  I also referred her for a therapist.  She is here for follow-up.    Medications and allergies reviewed with patient and updated if appropriate.  Current Outpatient Medications on File Prior to Visit  Medication Sig Dispense Refill   albuterol (VENTOLIN HFA) 108 (90 Base) MCG/ACT inhaler INHALE 2 PUFFS BY MOUTH EVERY 4 HOURS AS NEEDED FOR WHEEZING 18 g 5   atenolol (TENORMIN) 50 MG tablet TAKE 1 TABLET BY MOUTH DAILY 90 tablet 0   clonazePAM (KLONOPIN) 0.5 MG tablet TAKE 1 TO 1 AND 1/2 TABLETS BY MOUTH EVERY 8 TO 12 HOURS AS NEEDED 30 tablet 1   FLUoxetine (PROZAC) 40 MG capsule Take 1 capsule (40 mg total) by mouth daily. 90 capsule 3   fluticasone-salmeterol (ADVAIR) 250-50 MCG/ACT AEPB SMARTSIG:1 Puff(s) Via Inhaler Morning-Night 60 each 5   HYDROcodone-acetaminophen (NORCO) 10-325 MG per tablet Take 1 tablet by mouth 5 (five) times daily.      rosuvastatin (CRESTOR) 5 MG tablet TAKE 1 TABLET BY MOUTH DAILY 90 tablet 0   zolpidem (AMBIEN) 5 MG tablet Take 1 tablet (5 mg total) by mouth at bedtime as needed for sleep. 30 tablet 0   No current facility-administered medications on file prior to visit.     Review of Systems     Objective:  There were no vitals filed for this visit. BP Readings from Last 3 Encounters:  06/11/21 120/78  03/14/21 128/80  12/11/20 120/70   Wt Readings from Last 3 Encounters:  06/11/21 216 lb (98 kg)  03/14/21 215 lb (97.5 kg)  12/11/20 221 lb 3.2 oz (100.3 kg)   There is no height or weight on file to calculate BMI.    Physical Exam     Lab Results  Component Value Date   WBC 7.3 06/11/2021   HGB 15.0 06/11/2021   HCT 44.7 06/11/2021   PLT 280.0 06/11/2021   GLUCOSE 89  06/11/2021   CHOL 181 06/11/2021   TRIG 133.0 06/11/2021   HDL 54.10 06/11/2021   LDLDIRECT 202.9 11/03/2012   LDLCALC 100 (H) 06/11/2021   ALT 26 06/11/2021   AST 23 06/11/2021   NA 140 06/11/2021   K 4.4 06/11/2021   CL 102 06/11/2021   CREATININE 0.71 06/11/2021   BUN 10 06/11/2021   CO2 34 (H) 06/11/2021   TSH 0.48 06/11/2021   HGBA1C 6.4 06/11/2021   MICROALBUR 3.0 (H) 12/11/2020     Assessment & Plan:    See Problem List for Assessment and Plan of chronic medical problems.

## 2021-07-09 ENCOUNTER — Ambulatory Visit: Payer: BC Managed Care – PPO | Admitting: Internal Medicine

## 2021-07-09 DIAGNOSIS — F411 Generalized anxiety disorder: Secondary | ICD-10-CM

## 2021-07-09 DIAGNOSIS — F3289 Other specified depressive episodes: Secondary | ICD-10-CM

## 2021-08-22 ENCOUNTER — Other Ambulatory Visit: Payer: Self-pay | Admitting: Internal Medicine

## 2021-10-03 DIAGNOSIS — Z79899 Other long term (current) drug therapy: Secondary | ICD-10-CM | POA: Diagnosis not present

## 2021-10-03 DIAGNOSIS — Z5181 Encounter for therapeutic drug level monitoring: Secondary | ICD-10-CM | POA: Diagnosis not present

## 2021-10-03 DIAGNOSIS — G894 Chronic pain syndrome: Secondary | ICD-10-CM | POA: Diagnosis not present

## 2021-10-13 DIAGNOSIS — M542 Cervicalgia: Secondary | ICD-10-CM | POA: Diagnosis not present

## 2021-11-20 ENCOUNTER — Other Ambulatory Visit: Payer: Self-pay | Admitting: Internal Medicine

## 2021-12-31 DIAGNOSIS — K011 Impacted teeth: Secondary | ICD-10-CM | POA: Diagnosis not present

## 2022-01-06 DIAGNOSIS — G894 Chronic pain syndrome: Secondary | ICD-10-CM | POA: Diagnosis not present

## 2022-01-06 DIAGNOSIS — M5030 Other cervical disc degeneration, unspecified cervical region: Secondary | ICD-10-CM | POA: Diagnosis not present

## 2022-01-29 ENCOUNTER — Emergency Department (HOSPITAL_COMMUNITY): Payer: BC Managed Care – PPO

## 2022-01-29 ENCOUNTER — Inpatient Hospital Stay (HOSPITAL_COMMUNITY)
Admission: EM | Admit: 2022-01-29 | Discharge: 2022-02-02 | DRG: 834 | Disposition: A | Discharge status: Other Acute Inpatient Hospital | Payer: BC Managed Care – PPO | Attending: Family Medicine | Admitting: Family Medicine

## 2022-01-29 DIAGNOSIS — D539 Nutritional anemia, unspecified: Secondary | ICD-10-CM | POA: Diagnosis not present

## 2022-01-29 DIAGNOSIS — K859 Acute pancreatitis without necrosis or infection, unspecified: Secondary | ICD-10-CM | POA: Diagnosis not present

## 2022-01-29 DIAGNOSIS — Z833 Family history of diabetes mellitus: Secondary | ICD-10-CM

## 2022-01-29 DIAGNOSIS — Z9102 Food additives allergy status: Secondary | ICD-10-CM

## 2022-01-29 DIAGNOSIS — J9601 Acute respiratory failure with hypoxia: Secondary | ICD-10-CM | POA: Diagnosis not present

## 2022-01-29 DIAGNOSIS — E669 Obesity, unspecified: Secondary | ICD-10-CM | POA: Diagnosis not present

## 2022-01-29 DIAGNOSIS — D479 Neoplasm of uncertain behavior of lymphoid, hematopoietic and related tissue, unspecified: Secondary | ICD-10-CM | POA: Diagnosis not present

## 2022-01-29 DIAGNOSIS — Z6838 Body mass index (BMI) 38.0-38.9, adult: Secondary | ICD-10-CM | POA: Diagnosis not present

## 2022-01-29 DIAGNOSIS — Z800 Family history of malignant neoplasm of digestive organs: Secondary | ICD-10-CM

## 2022-01-29 DIAGNOSIS — R911 Solitary pulmonary nodule: Secondary | ICD-10-CM | POA: Diagnosis present

## 2022-01-29 DIAGNOSIS — J45909 Unspecified asthma, uncomplicated: Secondary | ICD-10-CM | POA: Diagnosis present

## 2022-01-29 DIAGNOSIS — I479 Paroxysmal tachycardia, unspecified: Secondary | ICD-10-CM | POA: Diagnosis not present

## 2022-01-29 DIAGNOSIS — Z8249 Family history of ischemic heart disease and other diseases of the circulatory system: Secondary | ICD-10-CM

## 2022-01-29 DIAGNOSIS — Z801 Family history of malignant neoplasm of trachea, bronchus and lung: Secondary | ICD-10-CM

## 2022-01-29 DIAGNOSIS — I10 Essential (primary) hypertension: Secondary | ICD-10-CM | POA: Diagnosis present

## 2022-01-29 DIAGNOSIS — C9300 Acute monoblastic/monocytic leukemia, not having achieved remission: Secondary | ICD-10-CM | POA: Diagnosis not present

## 2022-01-29 DIAGNOSIS — C9200 Acute myeloblastic leukemia, not having achieved remission: Secondary | ICD-10-CM | POA: Diagnosis not present

## 2022-01-29 DIAGNOSIS — E440 Moderate protein-calorie malnutrition: Secondary | ICD-10-CM | POA: Diagnosis present

## 2022-01-29 DIAGNOSIS — K0889 Other specified disorders of teeth and supporting structures: Secondary | ICD-10-CM | POA: Diagnosis present

## 2022-01-29 DIAGNOSIS — K5900 Constipation, unspecified: Secondary | ICD-10-CM | POA: Diagnosis not present

## 2022-01-29 DIAGNOSIS — J90 Pleural effusion, not elsewhere classified: Secondary | ICD-10-CM | POA: Diagnosis not present

## 2022-01-29 DIAGNOSIS — R7989 Other specified abnormal findings of blood chemistry: Secondary | ICD-10-CM | POA: Diagnosis present

## 2022-01-29 DIAGNOSIS — E785 Hyperlipidemia, unspecified: Secondary | ICD-10-CM | POA: Diagnosis not present

## 2022-01-29 DIAGNOSIS — F419 Anxiety disorder, unspecified: Secondary | ICD-10-CM | POA: Diagnosis present

## 2022-01-29 DIAGNOSIS — F32A Depression, unspecified: Secondary | ICD-10-CM | POA: Diagnosis not present

## 2022-01-29 DIAGNOSIS — E119 Type 2 diabetes mellitus without complications: Secondary | ICD-10-CM | POA: Diagnosis present

## 2022-01-29 DIAGNOSIS — K219 Gastro-esophageal reflux disease without esophagitis: Secondary | ICD-10-CM | POA: Diagnosis present

## 2022-01-29 DIAGNOSIS — Z7951 Long term (current) use of inhaled steroids: Secondary | ICD-10-CM

## 2022-01-29 DIAGNOSIS — E222 Syndrome of inappropriate secretion of antidiuretic hormone: Secondary | ICD-10-CM | POA: Diagnosis not present

## 2022-01-29 DIAGNOSIS — C9590 Leukemia, unspecified not having achieved remission: Secondary | ICD-10-CM

## 2022-01-29 DIAGNOSIS — Z79899 Other long term (current) drug therapy: Secondary | ICD-10-CM

## 2022-01-29 DIAGNOSIS — D649 Anemia, unspecified: Secondary | ICD-10-CM | POA: Diagnosis not present

## 2022-01-29 DIAGNOSIS — J189 Pneumonia, unspecified organism: Secondary | ICD-10-CM

## 2022-01-29 DIAGNOSIS — R5081 Fever presenting with conditions classified elsewhere: Secondary | ICD-10-CM | POA: Diagnosis not present

## 2022-01-29 DIAGNOSIS — Z818 Family history of other mental and behavioral disorders: Secondary | ICD-10-CM

## 2022-01-29 DIAGNOSIS — R0602 Shortness of breath: Secondary | ICD-10-CM | POA: Diagnosis not present

## 2022-01-29 DIAGNOSIS — Z91048 Other nonmedicinal substance allergy status: Secondary | ICD-10-CM

## 2022-01-29 DIAGNOSIS — D630 Anemia in neoplastic disease: Secondary | ICD-10-CM | POA: Diagnosis not present

## 2022-01-29 DIAGNOSIS — D696 Thrombocytopenia, unspecified: Secondary | ICD-10-CM | POA: Diagnosis not present

## 2022-01-29 DIAGNOSIS — R079 Chest pain, unspecified: Secondary | ICD-10-CM | POA: Diagnosis not present

## 2022-01-29 DIAGNOSIS — D709 Neutropenia, unspecified: Secondary | ICD-10-CM | POA: Diagnosis not present

## 2022-01-29 DIAGNOSIS — Z83438 Family history of other disorder of lipoprotein metabolism and other lipidemia: Secondary | ICD-10-CM

## 2022-01-29 DIAGNOSIS — F17290 Nicotine dependence, other tobacco product, uncomplicated: Secondary | ICD-10-CM | POA: Diagnosis present

## 2022-01-29 DIAGNOSIS — E8770 Fluid overload, unspecified: Secondary | ICD-10-CM | POA: Diagnosis not present

## 2022-01-29 DIAGNOSIS — E0781 Sick-euthyroid syndrome: Secondary | ICD-10-CM | POA: Diagnosis not present

## 2022-01-29 DIAGNOSIS — R011 Cardiac murmur, unspecified: Secondary | ICD-10-CM | POA: Diagnosis not present

## 2022-01-29 DIAGNOSIS — D72829 Elevated white blood cell count, unspecified: Secondary | ICD-10-CM | POA: Diagnosis not present

## 2022-01-29 DIAGNOSIS — Z803 Family history of malignant neoplasm of breast: Secondary | ICD-10-CM

## 2022-01-29 DIAGNOSIS — Z1152 Encounter for screening for COVID-19: Secondary | ICD-10-CM | POA: Diagnosis not present

## 2022-01-29 DIAGNOSIS — D61810 Antineoplastic chemotherapy induced pancytopenia: Secondary | ICD-10-CM | POA: Diagnosis not present

## 2022-01-29 LAB — HEPATIC FUNCTION PANEL
ALT: 21 U/L (ref 0–44)
AST: 30 U/L (ref 15–41)
Albumin: 3.3 g/dL — ABNORMAL LOW (ref 3.5–5.0)
Alkaline Phosphatase: 73 U/L (ref 38–126)
Bilirubin, Direct: <0.1 mg/dL (ref 0.0–0.2)
Total Bilirubin: 0.6 mg/dL (ref 0.3–1.2)
Total Protein: 7.3 g/dL (ref 6.5–8.1)

## 2022-01-29 LAB — BASIC METABOLIC PANEL
Anion gap: 8 (ref 5–15)
BUN: 6 mg/dL (ref 6–20)
CO2: 30 mmol/L (ref 22–32)
Calcium: 8.6 mg/dL — ABNORMAL LOW (ref 8.9–10.3)
Chloride: 98 mmol/L (ref 98–111)
Creatinine, Ser: 0.67 mg/dL (ref 0.44–1.00)
GFR, Estimated: >60 mL/min (ref >60)
Glucose, Bld: 105 mg/dL — ABNORMAL HIGH (ref 70–99)
Potassium: 3.9 mmol/L (ref 3.5–5.1)
Sodium: 136 mmol/L (ref 135–145)

## 2022-01-29 LAB — D-DIMER, QUANTITATIVE: D-Dimer, Quant: 1.75 ug/mL-FEU — ABNORMAL HIGH (ref 0.00–0.50)

## 2022-01-29 LAB — CBC
HCT: 26.7 % — ABNORMAL LOW (ref 36.0–46.0)
Hemoglobin: 9.2 g/dL — ABNORMAL LOW (ref 12.0–15.0)
MCH: 34.5 pg — ABNORMAL HIGH (ref 26.0–34.0)
MCHC: 34.5 g/dL (ref 30.0–36.0)
MCV: 100 fL (ref 80.0–100.0)
Platelets: 43 10*3/uL — ABNORMAL LOW (ref 150–400)
RBC: 2.67 MIL/uL — ABNORMAL LOW (ref 3.87–5.11)
RDW: 14.9 % (ref 11.5–15.5)
WBC: 17.4 10*3/uL — ABNORMAL HIGH (ref 4.0–10.5)
nRBC: 0.5 % — ABNORMAL HIGH (ref 0.0–0.2)

## 2022-01-29 LAB — RESP PANEL BY RT-PCR (RSV, FLU A&B, COVID)  RVPGX2
Influenza A by PCR: NEGATIVE
Influenza B by PCR: NEGATIVE
Resp Syncytial Virus by PCR: NEGATIVE
SARS Coronavirus 2 by RT PCR: NEGATIVE

## 2022-01-29 LAB — ABO/RH: ABO/RH(D): B POS

## 2022-01-29 LAB — TROPONIN I (HIGH SENSITIVITY)
Troponin I (High Sensitivity): <2 ng/L (ref <18)
Troponin I (High Sensitivity): 3 ng/L (ref <18)

## 2022-01-29 LAB — TYPE AND SCREEN
ABO/RH(D): B POS
Antibody Screen: NEGATIVE

## 2022-01-29 LAB — POC OCCULT BLOOD, ED: Fecal Occult Bld: NEGATIVE

## 2022-01-29 LAB — LIPASE, BLOOD: Lipase: 37 U/L (ref 11–51)

## 2022-01-29 LAB — BRAIN NATRIURETIC PEPTIDE: B Natriuretic Peptide: 28.4 pg/mL (ref 0.0–100.0)

## 2022-01-29 LAB — LACTIC ACID, PLASMA
Lactic Acid, Venous: 1.5 mmol/L (ref 0.5–1.9)
Lactic Acid, Venous: 1.1 mmol/L (ref 0.5–1.9)

## 2022-01-29 MED ORDER — IPRATROPIUM-ALBUTEROL 0.5-2.5 (3) MG/3ML IN SOLN
3.0000 mL | Freq: Once | RESPIRATORY_TRACT | Status: AC
  Administered 2022-01-29: 3 mL via RESPIRATORY_TRACT
  Filled 2022-01-29: qty 3

## 2022-01-29 MED ORDER — ATENOLOL 50 MG PO TABS
50.0000 mg | ORAL_TABLET | Freq: Every day | ORAL | Status: DC
  Administered 2022-01-30 – 2022-02-02 (×4): 50 mg via ORAL
  Filled 2022-01-29 (×4): qty 1

## 2022-01-29 MED ORDER — ACETAMINOPHEN 650 MG RE SUPP: 650.0000 mg | Freq: Four times a day (QID) | RECTAL | Status: DC | PRN

## 2022-01-29 MED ORDER — IOHEXOL 350 MG/ML SOLN
75.0000 mL | Freq: Once | INTRAVENOUS | Status: AC | PRN
  Administered 2022-01-29: 75 mL via INTRAVENOUS

## 2022-01-29 MED ORDER — FAMOTIDINE 20 MG PO TABS: 20.0000 mg | ORAL_TABLET | Freq: Every day | ORAL | Status: DC | PRN

## 2022-01-29 MED ORDER — FLUCONAZOLE 150 MG PO TABS
150.0000 mg | ORAL_TABLET | Freq: Once | ORAL | Status: AC
  Administered 2022-01-29: 150 mg via ORAL
  Filled 2022-01-29: qty 1

## 2022-01-29 MED ORDER — ROSUVASTATIN CALCIUM 5 MG PO TABS
5.0000 mg | ORAL_TABLET | Freq: Every day | ORAL | Status: DC
  Administered 2022-01-30 – 2022-02-02 (×4): 5 mg via ORAL
  Filled 2022-01-29 (×4): qty 1

## 2022-01-29 MED ORDER — FLUOXETINE HCL 20 MG PO CAPS
40.0000 mg | ORAL_CAPSULE | Freq: Every day | ORAL | Status: DC
  Administered 2022-01-30 – 2022-02-02 (×4): 40 mg via ORAL
  Filled 2022-01-29 (×4): qty 2

## 2022-01-29 MED ORDER — SODIUM CHLORIDE 0.9 % IV SOLN
1.0000 g | Freq: Once | INTRAVENOUS | Status: AC
  Administered 2022-01-29: 1 g via INTRAVENOUS
  Filled 2022-01-29: qty 10

## 2022-01-29 MED ORDER — ACETAMINOPHEN 325 MG PO TABS
650.0000 mg | ORAL_TABLET | Freq: Four times a day (QID) | ORAL | Status: DC | PRN
  Filled 2022-01-29: qty 2

## 2022-01-29 MED ORDER — SODIUM CHLORIDE 0.9 % IV SOLN
500.0000 mg | Freq: Once | INTRAVENOUS | Status: AC
  Administered 2022-01-29: 500 mg via INTRAVENOUS
  Filled 2022-01-29: qty 5

## 2022-01-29 MED ORDER — ALBUTEROL SULFATE HFA 108 (90 BASE) MCG/ACT IN AERS: 2.0000 | INHALATION_SPRAY | RESPIRATORY_TRACT | Status: DC | PRN

## 2022-01-29 MED ORDER — KETOROLAC TROMETHAMINE 15 MG/ML IJ SOLN
15.0000 mg | Freq: Once | INTRAMUSCULAR | Status: AC
  Administered 2022-01-29: 15 mg via INTRAVENOUS
  Filled 2022-01-29: qty 1

## 2022-01-29 MED ORDER — HYDROCODONE-ACETAMINOPHEN 10-325 MG PO TABS
1.0000 | ORAL_TABLET | Freq: Four times a day (QID) | ORAL | Status: DC | PRN
  Administered 2022-01-30 (×3): 1 via ORAL
  Filled 2022-01-29 (×3): qty 1

## 2022-01-29 NOTE — ED Provider Notes (Signed)
[facility] EMERGENCY DEPARTMENT Provider Note   CSN: [id number] Arrival date & time: 01/29/22  1240     History  Chief Complaint  Patient presents with   Chest Pain    Amanda [name] is a 57 y.o. female with a past medical history of hypertension, hyperlipidemia, type 2 diabetes and asthma presenting today due to chest pain and SOB.  She describes the chest pain as sharp and constant.  She also says that she feels as though she could not get a deep breath in or out, even at rest.  No history of DVT/PE, does endorse a recent road trip to the [facility]  No estrogen products, cough, fever or leg swelling.  Of note, patient has been on 2 courses of Augmentin after a dental procedure and presumed sinusitis.  She stopped taking the Augmentin on Monday.   Chest Pain Associated symptoms: shortness of breath   Associated symptoms: no fever        Home Medications Prior to Admission medications   Medication Sig Start Date End Date Taking? Authorizing Provider  albuterol (VENTOLIN HFA) 108 (90 Base) MCG/ACT inhaler INHALE 2 PUFFS BY MOUTH EVERY 4 HOURS AS NEEDED FOR WHEEZING 11/27/20   Burns, Claudina Lick, MD  atenolol (TENORMIN) 50 MG tablet TAKE 1 TABLET BY MOUTH DAILY 11/20/21   Burns, Claudina Lick, MD  clonazePAM (KLONOPIN) 0.5 MG tablet TAKE 1 TO 1 AND 1/2 TABLETS BY MOUTH EVERY 8 TO 12 HOURS AS NEEDED 04/12/21   Binnie Rail, MD  FLUoxetine (PROZAC) 40 MG capsule Take 1 capsule (40 mg total) by mouth daily. 06/11/21   Binnie Rail, MD  fluticasone-salmeterol (ADVAIR) 250-50 MCG/ACT AEPB SMARTSIG:1 Puff(s) Via Inhaler Morning-Night 06/11/21   Burns, Claudina Lick, MD  HYDROcodone-acetaminophen (NORCO) 10-325 MG per tablet Take 1 tablet by mouth 5 (five) times daily.     [provider]  rosuvastatin (CRESTOR) 5 MG tablet TAKE 1 TABLET BY MOUTH DAILY 11/20/21   Binnie Rail, MD  zolpidem (AMBIEN) 5 MG tablet Take 1 tablet (5 mg total) by mouth at bedtime as needed for sleep.  12/02/19   Binnie Rail, MD      Allergies    Monosodium glutamate and Cat hair extract    Review of Systems   Review of Systems  Constitutional:  Negative for chills and fever.  Respiratory:  Positive for shortness of breath. Negative for chest tightness.   Cardiovascular:  Positive for chest pain.    Physical Exam Updated Vital Signs BP (!) 143/70   Pulse 80   Temp 98.9 F (37.2 C) (Oral)   Resp 20   SpO2 92%  Physical Exam Vitals and nursing note reviewed.  Constitutional:      General: She is not in acute distress.    Appearance: Normal appearance. She is not ill-appearing.  HENT:     Head: Normocephalic and atraumatic.  Eyes:     General: No scleral icterus.    Conjunctiva/sclera: Conjunctivae normal.  Cardiovascular:     Rate and Rhythm: Normal rate and regular rhythm.  Pulmonary:     Effort: Pulmonary effort is normal. No respiratory distress.     Breath sounds: Normal breath sounds.  Genitourinary:    Rectum: Guaiac result negative. External hemorrhoid present. No tenderness or internal hemorrhoid.     Comments: Rectal exam performed in presence of nurse tech.  Patient with multiple uncomplicated external hemorrhoids.  No palpated internal hemorrhoids.  No melanotic stool.  Occult negative  Musculoskeletal:     Right lower leg: No tenderness. No edema.     Left lower leg: No tenderness. No edema.  Skin:    General: Skin is warm and dry.     Coloration: Skin is pale.     Findings: No rash.  Neurological:     Mental Status: She is alert.  Psychiatric:        Mood and Affect: Mood normal.     ED Results / Procedures / Treatments   Labs (all labs ordered are listed, but only abnormal results are displayed) Labs Reviewed  BASIC METABOLIC PANEL - Abnormal; Notable for the following components:      Result Value   Glucose, Bld 105 (*)    Calcium 8.6 (*)    All other components within normal limits  CBC - Abnormal; Notable for the following components:    WBC 17.4 (*)    RBC 2.67 (*)    Hemoglobin 9.2 (*)    HCT 26.7 (*)    MCH 34.5 (*)    Platelets 43 (*)    nRBC 0.5 (*)    All other components within normal limits  HEPATIC FUNCTION PANEL - Abnormal; Notable for the following components:   Albumin 3.3 (*)    All other components within normal limits  D-DIMER, QUANTITATIVE - Abnormal; Notable for the following components:   D-Dimer, Quant 1.75 (*)    All other components within normal limits  LIPASE, BLOOD  LACTIC ACID, PLASMA  LACTIC ACID, PLASMA  POC OCCULT BLOOD, ED  TYPE AND SCREEN  ABO/RH  TROPONIN I (HIGH SENSITIVITY)  TROPONIN I (HIGH SENSITIVITY)    EKG None  Radiology CT ABDOMEN PELVIS W CONTRAST  Result Date: 01/29/2022 CLINICAL DATA:  Pancreatitis.  Abdominal pain EXAM: CT ABDOMEN AND PELVIS WITH CONTRAST TECHNIQUE: Multidetector CT imaging of the abdomen and pelvis was performed using the standard protocol following bolus administration of intravenous contrast. RADIATION DOSE REDUCTION: This exam was performed according to the departmental dose-optimization program which includes automated exposure control, adjustment of the mA and/or kV according to patient size and/or use of iterative reconstruction technique. CONTRAST:  74m OMNIPAQUE IOHEXOL 350 MG/ML SOLN COMPARISON:  CT October 2010 FINDINGS: Lower chest: Please see separate dictation of chest CT scan from same day. Fatty liver infiltration identified. No mass lesion. Patent portal vein. Gallbladder is nondilated. Hepatobiliary: Mild fatty liver infiltration. No space-occupying lesion. Patent portal vein. Gallbladder is nondilated. Pancreas: Pancreas is mildly atrophic and has some areas of fatty replacement particularly towards the head and neck region. No adjacent inflammatory changes. No fluid collections. Spleen:Spleen has a longitudinal length of 12.7 cm, borderline in size. Preserved enhancement. Small splenule noted inferior and posterior along the splenic  margin. Adrenals/Urinary Tract: Adrenal glands are preserved. Preserved renal enhancement. Slightly malrotated right kidney. No collecting system dilatation. Preserved contours of the urinary bladder. Stomach/Bowel: On this non oral contrast exam the large bowel is of normal course and caliber with scattered stool. Normal appendix. Stomach is nondilated. Small bowel is nondilated. No free air or free fluid. Vascular/Lymphatic: Normal caliber aorta and IVC. Mild atherosclerotic calcified plaque along the aorta and branch vessels. No specific abnormal lymph node enlargement identified in the abdomen or pelvis. A few prominent lymph nodes seen towards the porta hepatis that are less than a cm in short axis and not pathologic by size criteria. Some of these were seen on the prior study of 2010. Note is made of some collateral  vessels in the upper abdomen near the margin of the stomach posteriorly with a splenorenal shunt appearing collateral. Splenic vein is patent. Reproductive: Uterus and bilateral adnexa are unremarkable. Other: No ascites. Slightly protuberant midline anterior abdominal wall with a tiny fat containing umbilical hernia. Musculoskeletal: Mild degenerative changes along the spine. IMPRESSION: Preserved overall pancreatic enhancement with some mild areas of atrophy and fatty infiltration. No adjacent inflammatory changes or fluid collections seen at this time. No CT sequela of pancreatitis. Few collateral vessels in the upper abdomen including a splenorenal shunt. Borderline spleen. Fatty liver infiltration. Please see separate dictation of chest CT examination. Electronically Signed   By: Jill Side M.D.   On: 01/29/2022 16:57   CT Angio Chest PE W and/or Wo Contrast  Result Date: 01/29/2022 CLINICAL DATA:  Chest pain and shortness of breath. EXAM: CT ANGIOGRAPHY CHEST WITH CONTRAST TECHNIQUE: Multidetector CT imaging of the chest was performed using the standard protocol during bolus  administration of intravenous contrast. Multiplanar CT image reconstructions and MIPs were obtained to evaluate the vascular anatomy. RADIATION DOSE REDUCTION: This exam was performed according to the departmental dose-optimization program which includes automated exposure control, adjustment of the mA and/or kV according to patient size and/or use of iterative reconstruction technique. CONTRAST:  37m OMNIPAQUE IOHEXOL 350 MG/ML SOLN COMPARISON:  X-ray 01/29/2022 and earlier. FINDINGS: Cardiovascular: Normal caliber thoracic aorta. Minimal atherosclerotic change. Heart is nonenlarged. No significant pericardial effusion. There is some motion. This can limit evaluation of small and peripheral emboli. No pulmonary embolism otherwise identified. Mediastinum/Nodes: No specific abnormal lymph node enlargement seen in the axillary region. There is some prominent hilar nodes. Example on the right on series 4, image 59 measures 15 by 9 mm. Smaller left hilar nodes. No clear abnormal lymph node enlargement seen in the mediastinum. Normal caliber thoracic esophagus. Lungs/Pleura: Lung windows are without pneumothorax or effusion. No consolidation. However there are some ill-defined areas of mild ground-glass changes scattered in both lungs. Slightly more in the lower lobes. Few reticular areas. Small subpleural right apical nodule posteriorly on series 5, image 23 measures 5 mm. Upper Abdomen: The adrenal glands are grossly preserved in the upper abdomen although incompletely included in the imaging field. Fatty liver infiltration. Musculoskeletal: Scattered degenerative changes along the spine. Presumed hemangioma within the vertebral body at T4. Small presumed bone island at T7. Review of the MIP images confirms the above findings. IMPRESSION: Mild breathing motion.  No pulmonary embolism poorly seen. Scattered mild areas of ground-glass changes in both lungs. Possible subtle infiltrative process including atypical  process. There are a few prominent hilar nodes which could be reactive. Simple follow-up is recommended. Please correlate with symptoms. Please see separate dictation of the abdomen and pelvis CT. Electronically Signed   By: AJill SideM.D.   On: 01/29/2022 16:50   DG Chest 2 View  Result Date: 01/29/2022 CLINICAL DATA:  Chest pain, shortness of breath EXAM: CHEST - 2 VIEW COMPARISON:  03/15/2018 FINDINGS: The heart size and mediastinal contours are within normal limits. Mildly coarsened bibasilar interstitial markings, slightly more pronounced on the right. No pleural effusion or pneumothorax. The visualized skeletal structures are unremarkable. IMPRESSION: Mildly coarsened bibasilar interstitial markings, slightly more pronounced on the right, which may reflect bronchitic lung changes versus developing infiltrate. Electronically Signed   By: NDavina PokeD.O.   On: 01/29/2022 14:20    Procedures Procedures   Medications Ordered in ED Medications  iohexol (OMNIPAQUE) 350 MG/ML injection 75 mL (75 mLs Intravenous  Contrast Given 01/29/22 1637)    ED Course/ Medical Decision Making/ A&P Clinical Course as of 01/29/22 1911  Wed Jan 29, 2022  1502 Elevated WBC. Anemia. Added lactic acid and type and screen. Patient did have an episode of dark stool.  [CA]  1527 D-dimer elevated. CTA chest to rule out PE. CT abdomen given significant epigastric tenderness. Awaiting lipase results.  [CA]    Clinical Course User Index [CA] Suzy Bouchard, PA-C                           Medical Decision Making Amount and/or Complexity of Data Reviewed Labs: ordered. Radiology: ordered.  Risk Prescription drug management. Decision regarding hospitalization.   57 year old female with past medical history of hypertension, hyperlipidemia and type 2 diabetes presenting today with chest pain and shortness of breath.  Differential includes but is not limited to to ACS, PE, pneumonia, pleural effusion,  dissection, pneumothorax, viral   this is not an exhaustive differential.    Past Medical History / Co-morbidities / Social History: HTN, HLD, T2DM   Additional history: Follows closely with  internal medicine for primary care.  No cardiology visits noted and has not had an in system echocardiogram, stress test or catheterization.  Patient also tells me that she had 1 episode of melena recently however it was after taking Pepto-Bismol so she assumed that was the cause.  Denies heavy NSAID or alcohol use.   Physical Exam: Pertinent physical exam findings include Hypoxic to upper 80s at rest, very uncomfortable when she ambulates.  Pale  Lab Tests: I ordered, and personally interpreted labs.  The pertinent results include: Negative hemoglobin 9.2, normally within normal limits.  Questionable melena?  Likely secondary to Pepto-Bismol Leukocytosis 17.4 PLT 43 Hemoccult  negative   Imaging Studies: Extensive imaging done in triage.  CT PE scan ordered in triage after elevated dimer.  It shows a likely developing pneumonia but no PE.    Medications: Toradol for chest discomfort.   Diflucan for yeast infection concern after 2 doses of antibiotics.    MDM/Disposition: This is a 57 year old female presenting today with chest pain and some dyspnea.  Has been ongoing over the past couple of days.  Has been treated for sinusitis and dental infection with 2 courses of Augmentin that she finished on Monday.  Continues to feel very short of breath, worse with exertion and inspiration.  D-dimer was elevated so CT PE was ordered.  No signs of PE however there does appear to be a developing pneumonia.  This is consistent with her leukocytosis.  She was hypoxic on ambulation and had multiple desaturations to the upper 80s.  Will require admission for hypoxic pneumonia.  Of note patient is also anemic and with some thrombocytopenia.  This can be worked up in the hospital.  Do not believe she  needs emergent transfusion at this time.  Admit to family medicine     Final Clinical Impression(s) / ED Diagnoses Final diagnoses:  Anemia, unspecified type  Thrombocytopenia ([place])    Rx / DC Orders ED Discharge Orders     None      Admit to [place], PA-C 01/29/22 2111    Fransico Meadow, MD 01/30/22 1016

## 2022-01-29 NOTE — ED Notes (Signed)
Hospitalist at bedside 

## 2022-01-29 NOTE — H&P (Incomplete)
Hospital Admission History and Physical Service Pager: [phone]  Patient name: Amanda [name] Medical record number: [id number] Date of Birth: 09-Jan-1966 Age: 57 y.o. Gender: female  Primary Care Provider: Binnie Rail, MD Consultants: none Code Status: FULL Preferred Emergency Contact: spouse [place]     Name Relation Home Work Mobile   Korte,Gregg Spouse Millry Son [phone]     Cathryn, Gallery   [phone]        Chief Complaint: dyspnea  Assessment and Plan: Amanda [name] is a 57 y.o. female presenting with SOB, pleuritic chest pain, anemia, and thrombocytopenia.   No notes have been filed under this hospital service. Service: Family Medicine  Tx for CAP - consider workup for carditis  Test for anemia  Smear for anemia and thrombo    FEN/GI: *** VTE Prophylaxis: ***  Disposition: ***  History of Present Illness:  Amanda [name] is a 57 y.o. female presenting with dyspnea.  Patient reports she developed dyspnea and pleuritic chest pain that started about 1-2 weeks ago while she was in [place], [place]. She did have a subjective fever at some point in last week. Spouse reports that she has been coughing for the past 2 days. She had a negative Covid test. She has a history of asthma but has not been needing to use her albuterol. She did try albuterol but did not notice any difference in her symptoms. She has had some burning epigastric pain as well. She noticed one day of dark stools after taking Pepto-Bismol. They recently came back from [place] 3 days ago (they drove back home). Denies congestion, rhinorrhea, sore throat, hemoptysis. Reports she is UTD on colon cancer screening (colonoscopy). No regular NSAID use.  She reports that she has been treated with antibiotics (amoxicillin, then amoxicillin-clavulanate) continuously for the past month for sinusitis. She was initially placed on Amox x10days for prophylaxis for root  canal, but then was told she had a sinus infection and was placed on a higher dose of amox. However, she ended up getting some dental procedure still done despite this infection, afterwards she was in a lot of pain. Then was started on Augmentin for the sinus infection for 10 days, then course was prolonged for another 10 days.  Patient reports she has not been eating well for the past 6 months due to her dentition. She sometimes misses meals.   In the ED, CTA was negative for PE but found possible potential developing PNA. Pt also found to have leukocytosis, anemia, and thrombocytopenia. Pt desatted to 48, subsequently placed on 2L [place].   Review Of Systems: Per HPI with the following additions:  Review of Systems  Constitutional:  Positive for fever (subjective).  Respiratory:  Positive for shortness of breath.   Cardiovascular:  Positive for chest pain.  Gastrointestinal:  Positive for abdominal pain (epigastric) and nausea.     Pertinent Past Medical History: Asthma, GERD, anxiety, HLD, T2DM, paroxysmal tachycardia No history of COPD Remainder reviewed in history tab.   Pertinent Past Surgical History: Abm hysterectomy  Remainder reviewed in history tab.   Pertinent Social History: Tobacco use: Yes/No/Former - smoked for 30 years but quit 10 years ago Alcohol use: denies Other Substance use: denies Lives with husband, dog  Pertinent Family History: Mother: CAD, HLD Father: diabetes, colon cancer Brother: diabetes  Remainder reviewed in history tab.   Important Outpatient Medications: Albuterol, atenolol, pepcid, Prozac, Norco, Crestor. - Took meds this  morning  Remainder reviewed in medication history.   Objective: BP 129/69   Pulse 89   Temp 98.8 F (37.1 C) (Oral)   Resp 15   SpO2 97%  Exam: General: Alert, pleasant, speaking in full sentences on 2L [place]. Laying comfortably in bed. NAD. Eyes: PERRLE ENTM: NCAT. MMM. Poor dentition. Cardiovascular: RRR, no  murmurs. Cap refill <2 Respiratory: CTAB, no crackles or wheezing. Normal WOB on 2L [place]. Gastrointestinal: Tender to palpation in epigastrium. Soft, nondistended. Normal BS.  Ext: No pitting edema Neuro: Alert and grossly oriented, responding appropriately.  Psych: Mood and affect congruent.  Labs:  CBC BMET  Recent Labs  Lab 01/29/22 1345  WBC 17.4*  HGB 9.2*  HCT 26.7*  PLT 43*   Recent Labs  Lab 01/29/22 1345  NA 136  K 3.9  CL 98  CO2 30  BUN 6  CREATININE 0.67  GLUCOSE 105*  CALCIUM 8.6*    FOBT neg BNP wnl Lactic acid wnl RPP neg Trop wnl D dimer 1.75 (elevated)  Imaging Studies Performed:  CTA:  Mild breathing motion.  No pulmonary embolism poorly seen.   Scattered mild areas of ground-glass changes in both lungs. Possible  subtle infiltrative process including atypical process. There are a  few prominent hilar nodes which could be reactive. Simple follow-up  is recommended. Please correlate with symptoms.   CT-AP: Preserved overall pancreatic enhancement with some mild areas of  atrophy and fatty infiltration. No adjacent inflammatory changes or  fluid collections seen at this time. No CT sequela of pancreatitis.   Few collateral vessels in the upper abdomen including a splenorenal  shunt. Borderline spleen. Fatty liver infiltration.   CXR: Mildly coarsened bibasilar interstitial markings, slightly more  pronounced on the right, which may reflect bronchitic lung changes  versus developing infiltrate.  - My interpretation: increased bibasilar interstitial markings, but no obvious consolidations.   Arlyce Dice, MD 01/29/2022, 11:55 PM PGY-1, [place] Intern pager: [phone], text pages welcome Secure chat group [employer]

## 2022-01-29 NOTE — ED Triage Notes (Signed)
Patient here with cmplaint of sharp central chest pain and shortness o breath that started a few days ago, history of oral surgery in December 2023. Denies smoking, is alert, oriented, and in no apparent distress at this time.

## 2022-01-29 NOTE — H&P (Shared)
Hospital Admission History and Physical Service Pager: [phone]  Patient name: Amanda [name] Medical record number: [id number] Date of Birth: May 01, 1965 Age: 57 y.o. Gender: female  Primary Care Provider: Binnie Rail, MD Consultants: none Code Status: FULL Preferred Emergency Contact: spouse Jewell     Name Relation Home Work Mobile   Baar,Gregg Spouse Culbertson Son [phone]     Deysy, Schabel   [phone]        Chief Complaint: dyspnea  Assessment and Plan: Amanda [name] is a 57 y.o. female presenting with *** . Differential for this patient's presentation of this includes ***.  (***describe here why less likely than primary diagnosis-delete this once complete***)  No notes have been filed under this hospital service. Service: Family Medicine  Tx for CAP - consider workup for carditis  Test for anemia  Smear for anemia and thrombo    FEN/GI: *** VTE Prophylaxis: ***  Disposition: ***  History of Present Illness:  Amanda [name] is a 57 y.o. female presenting with dyspnea.  Patient reports she developed dyspnea and pleuritic chest pain that started about 1-2 weeks ago while she was in [place], [place]. She did have a subjective fever at some point in last week. Spouse reports that she has been coughing for the past 2 days. She had a negative Covid test. She has a history of asthma but has not been needing to use her albuterol. She did try albuterol but did not notice any difference in her symptoms. She has had some burning epigastric pain as well. She noticed one day of dark stools after taking Pepto-Bismol. They recently came back from [place] 3 days ago (they drove back home). Denies congestion, rhinorrhea, sore throat, hemoptysis. Reports she is UTD on colon cancer screening (colonoscopy). No regular NSAID use.  She reports that she has been treated with antibiotics (amoxicillin, then amoxicillin-clavulanate) continuously  for the past month for sinusitis. She was initially placed on Amox x10days for prophylaxis for root canal, but then was told she had a sinus infection and was placed on a higher dose of amox. However, she ended up getting some dental procedure still done despite this infection, afterwards she was in a lot of pain. Then was started on Augmentin for the sinus infection for 10 days, then course was prolonged for another 10 days.  Patient reports she has not been eating well for the past 6 months due to her dentition. She sometimes misses meals.   In the ED, ***  Review Of Systems: Per HPI with the following additions:  Review of Systems  Constitutional:  Positive for fever (subjective).  Respiratory:  Positive for shortness of breath.   Cardiovascular:  Positive for chest pain.  Gastrointestinal:  Positive for abdominal pain (epigastric) and nausea.     Pertinent Past Medical History: Asthma No history of COPD Remainder reviewed in history tab.   Pertinent Past Surgical History: ***  Remainder reviewed in history tab.  Pertinent Social History: Tobacco use: Yes/No/Former - smoked for 30 years but quit 10 years ago Alcohol use: denies Other Substance use: denies Lives with husband, dog  Pertinent Family History: ***  Remainder reviewed in history tab.   Important Outpatient Medications: *** Remainder reviewed in medication history.   Objective: BP (!) 144/79   Pulse 80   Temp 98.7 F (37.1 C) (Oral)   Resp 18   SpO2 (!) 88%  Exam: General: Alert, pleasant, speaking in  full sentences on 2L [place]. Laying comfortably in bed. NAD. Eyes: PERRLE ENTM: NCAT. MMM. Poor dentition. Cardiovascular: RRR, no murmurs. Cap refill <2 Respiratory: CTAB, no crackles or wheezing. Normal WOB on 2L [place]. Gastrointestinal: Tender to palpation in epigastrium. Soft, nondistended. Normal BS.  Ext: No pitting edema Neuro: Alert and grossly oriented, responding appropriately.  Psych: Mood and affect  congruent.  Labs:  CBC BMET  Recent Labs  Lab 01/29/22 1345  WBC 17.4*  HGB 9.2*  HCT 26.7*  PLT 43*   Recent Labs  Lab 01/29/22 1345  NA 136  K 3.9  CL 98  CO2 30  BUN 6  CREATININE 0.67  GLUCOSE 105*  CALCIUM 8.6*    Pertinent additional labs ***.  EKG: My own interpretation (not copied from electronic read) ***    Imaging Studies Performed:  Imaging Study (ie. Chest x-ray) Impression from Radiologist: ***   My Interpretation: Zola Button, MD 01/29/2022, 9:19 PM PGY-***, [place] Intern pager: [phone], text pages welcome Secure chat group [place]

## 2022-01-29 NOTE — ED Notes (Addendum)
Pt placed on 2L of O2 and sats increased from 89% to 96% with oxygen therapy

## 2022-01-29 NOTE — ED Provider Triage Note (Signed)
Emergency Medicine Provider Triage Evaluation Note  Amanda [name] , a 57 y.o. female  was evaluated in triage.  Pt complains of chest pain that radiates to back associated with shortness of breath for the past few days.  [place]  Denies lower extremity edema.  No history of blood clots.  Patient had recent oral surgery in December 2023 and has been on numerous rounds of antibiotics.  No cardiac history. Some pain also in upper abdomen. Admits to dark stool after taking pepto.   Review of Systems  Positive: Chest pain, abdominal pain Negative: fever  Physical Exam  BP 124/75   Pulse 77   Temp 98.1 F (36.7 C)   Resp 19   SpO2 91%  Gen:   Awake, no distress   Resp:  Normal effort  MSK:   Moves extremities without difficulty  Other:  Epigastric tenderness. No lower extremity edema  Medical Decision Making  Medically screening exam initiated at 1:48 PM.  Appropriate orders placed.  Allyne B Dillion was informed that the remainder of the evaluation will be completed by another provider, this initial triage assessment does not replace that evaluation, and the importance of remaining in the ED until their evaluation is complete.  Stable vitals in triage, lower suspicion for dissection Labs D-dimer. Once d-dimer results will order +/-CTA chest and Ct abdomen. Question if pain is abdominal in nature vs. Chest.    Suzy Bouchard, PA-C 01/29/22 1350

## 2022-01-29 NOTE — Hospital Course (Addendum)
Amanda [name] is a 57 y.o.female with a history of *** who was admitted to the *** Teaching Service at [facility] for ***. {Blank single:19197::"His","Her"} hospital course is detailed below:  * Acute hypoxic respiratory failure ([place]) Pt presents w/ 1-2 wk hx of pleuritic chest pain and SOB. Of note, pt recently completed 2 rounds of amoxicillin and 2 rounds of Augmentin in the past month (consecutively) for dental/sinus infection. She also underwent a dental procedure at this time (maybe root canal). She also took a long road trip to and back from [facility] prior to SOB and pleuritic chest pain starting, pt initially thought it was from the antibiotics making her feel weird. She has had a subjective fever, but no recorded fevers during this time. Denies congestion, sore throat. Pt is now requiring 2L [place] (no oxygen '@baseline'$ ). CXR and CTA notable for potential developing PNA. WBC elevated. Differential includes PNA, PE, and endocarditis. PNA is most likely given new O2 requirement, leukocytosis, and imaging c/f developing PNA. PE is considered given recent travel and pleuritic chest pain, but less likely given negative CTA. Endocarditis is considered given recent dental infection and procedure, but less likely given lack of murmur on exam and lack of fever. ACS workup was negative.   Patient still having pleuritic chest pain, no dyspnea at rest on 2L O2 by .  Remains afebrile.  No autoimmune (skin, eye, joint) symptoms.  Etiology of hypoxia remains unclear and will consult pulmonology at this time.  Considering hemolysis as leading differential, particularly considering anemia and elevated LDH.  CXR and CTA notable for interstitial infiltrates but no lobar consolidation, thus inconsistent with typical pneumonia.  Atypical pneumonia possible, remains on differential.  Also considering endocarditis as do not miss Dx given recent dental infection and procedure, though lack of fevers make this less likely.  Does  have documented history of murmur, but did not hear on physical exam.  PE was considered given recent travel and pleuritic chest pain, but less likely given negative CTA. - Consult pulmonology - 2L , wean as tolerated (no O2 @ baseline) - Continuous pulse ox - f/u echo - Tylenol prn for fever and pain - Discontinue antibiotics for CAP (see below) - Consider blood Cx if pt fevers - AM CBC and BMP  Anemia Hgb incidentally noted to be 9.2. Pt reports one episode of dark stool after taking Pepto Bismol a few weeks ago, but none since. Abm exam notable for epigastric tenderness. Denies chronic NSAID use. Pt reports being UTD on colonscopies. Pt also reports poor diet for the past 6 months given her dentition. Differential includes GI bleed, malnutrition, and malignancy. GI bleed is considered given epigastric tenderness and episode of potential melena, but less likely given FOBT negative. Malnutrition is considered given pt hx of poor diet for past 6 months. Malignancy is considered given concomitant thrombocytopenia and leukocytosis.   Hgb 9.2>7.9.  B12, iron studies, INR, and PT WNL.  Ferritin elevated, though is an acute phase reactant.  LDH is elevated and immature reticulocytes are high, potentially consistent with hemolysis.  Differential includes hemolysis, GI bleed, malnutrition, and malignancy.  Possible that recent amoxicillin or Augmentin use has lead to autoimmune hemolysis.  Patient reports one episode of dark stool after taking Pepto Bismol a few weeks ago, but none since.  Also notes she previously had ulcer found with EGD several years ago.  Abdominal exam notable for epigastric tenderness.  Denies chronic NSAID use.  Patient reports being UTD on colonscopies,  father had colon cancer at 19.  Has had poor nutrition 2/2 dental pain and discomfort.  GI bleed is considered given epigastric tenderness and episode of potential melena, but less likely given FOBT negative. Malnutrition is  considered given pt hx of poor diet for past 6 months, but B12 normal.  Malignancy is considered given concomitant thrombocytopenia and leukocytosis, heme-onc is onboard. - Monitor for active bleeding - f/u peripheral blood smear - Daily CBC, trend H&H - UA with microscopy given hemolysis - Heme-onc recs  Thrombocytopenia (HCC) Incidentally noted to have thrombocytopenia on admission, now 43>32.  Etiology unclear.  No bleeding or bruising noted on exam, patient has not noticed such symptoms either.  No current indication for transfusion.  Heme-onc consulted and onboard. - f/u peripheral blood smear - Daily CBC, trend platelets - Heme-onc recs  Other chronic conditions were medically managed with home medications and formulary alternatives as necessary (anxiety, HLD, GERD, paroxysmal tachycardia, asthma, DDD)  PCP Follow-up Recommendations: Follow up pulmonary nodule in 6 months to one year as outpatient

## 2022-01-29 NOTE — ED Notes (Signed)
Provider aware of pt's decreasing O2 sats (88%) while resting on room air.

## 2022-01-30 ENCOUNTER — Observation Stay (HOSPITAL_COMMUNITY): Payer: BC Managed Care – PPO

## 2022-01-30 ENCOUNTER — Encounter (HOSPITAL_COMMUNITY): Payer: Self-pay | Admitting: Family Medicine

## 2022-01-30 ENCOUNTER — Other Ambulatory Visit: Payer: Self-pay

## 2022-01-30 DIAGNOSIS — Z91048 Other nonmedicinal substance allergy status: Secondary | ICD-10-CM | POA: Diagnosis not present

## 2022-01-30 DIAGNOSIS — R7989 Other specified abnormal findings of blood chemistry: Secondary | ICD-10-CM | POA: Diagnosis present

## 2022-01-30 DIAGNOSIS — D630 Anemia in neoplastic disease: Secondary | ICD-10-CM | POA: Diagnosis not present

## 2022-01-30 DIAGNOSIS — J45909 Unspecified asthma, uncomplicated: Secondary | ICD-10-CM | POA: Diagnosis present

## 2022-01-30 DIAGNOSIS — R911 Solitary pulmonary nodule: Secondary | ICD-10-CM | POA: Diagnosis present

## 2022-01-30 DIAGNOSIS — Z1152 Encounter for screening for COVID-19: Secondary | ICD-10-CM | POA: Diagnosis not present

## 2022-01-30 DIAGNOSIS — F17290 Nicotine dependence, other tobacco product, uncomplicated: Secondary | ICD-10-CM | POA: Diagnosis present

## 2022-01-30 DIAGNOSIS — E785 Hyperlipidemia, unspecified: Secondary | ICD-10-CM | POA: Diagnosis present

## 2022-01-30 DIAGNOSIS — J9601 Acute respiratory failure with hypoxia: Secondary | ICD-10-CM | POA: Diagnosis present

## 2022-01-30 DIAGNOSIS — R011 Cardiac murmur, unspecified: Secondary | ICD-10-CM | POA: Diagnosis not present

## 2022-01-30 DIAGNOSIS — I479 Paroxysmal tachycardia, unspecified: Secondary | ICD-10-CM | POA: Diagnosis present

## 2022-01-30 DIAGNOSIS — I10 Essential (primary) hypertension: Secondary | ICD-10-CM | POA: Diagnosis present

## 2022-01-30 DIAGNOSIS — K219 Gastro-esophageal reflux disease without esophagitis: Secondary | ICD-10-CM | POA: Diagnosis present

## 2022-01-30 DIAGNOSIS — Z800 Family history of malignant neoplasm of digestive organs: Secondary | ICD-10-CM | POA: Diagnosis not present

## 2022-01-30 DIAGNOSIS — F32A Depression, unspecified: Secondary | ICD-10-CM | POA: Diagnosis present

## 2022-01-30 DIAGNOSIS — Z833 Family history of diabetes mellitus: Secondary | ICD-10-CM | POA: Diagnosis not present

## 2022-01-30 DIAGNOSIS — Z6838 Body mass index (BMI) 38.0-38.9, adult: Secondary | ICD-10-CM | POA: Diagnosis not present

## 2022-01-30 DIAGNOSIS — E669 Obesity, unspecified: Secondary | ICD-10-CM | POA: Diagnosis present

## 2022-01-30 DIAGNOSIS — E119 Type 2 diabetes mellitus without complications: Secondary | ICD-10-CM | POA: Diagnosis present

## 2022-01-30 DIAGNOSIS — F419 Anxiety disorder, unspecified: Secondary | ICD-10-CM | POA: Diagnosis present

## 2022-01-30 DIAGNOSIS — C9200 Acute myeloblastic leukemia, not having achieved remission: Secondary | ICD-10-CM | POA: Diagnosis present

## 2022-01-30 DIAGNOSIS — D539 Nutritional anemia, unspecified: Secondary | ICD-10-CM | POA: Diagnosis present

## 2022-01-30 DIAGNOSIS — Z8249 Family history of ischemic heart disease and other diseases of the circulatory system: Secondary | ICD-10-CM | POA: Diagnosis not present

## 2022-01-30 DIAGNOSIS — E440 Moderate protein-calorie malnutrition: Secondary | ICD-10-CM | POA: Diagnosis present

## 2022-01-30 DIAGNOSIS — Z9102 Food additives allergy status: Secondary | ICD-10-CM | POA: Diagnosis not present

## 2022-01-30 DIAGNOSIS — D696 Thrombocytopenia, unspecified: Secondary | ICD-10-CM | POA: Diagnosis present

## 2022-01-30 LAB — TECHNOLOGIST SMEAR REVIEW: Plt Morphology: DECREASED

## 2022-01-30 LAB — URINALYSIS, ROUTINE W REFLEX MICROSCOPIC
Bacteria, UA: NONE SEEN
Bilirubin Urine: NEGATIVE
Glucose, UA: NEGATIVE mg/dL
Hgb urine dipstick: NEGATIVE
Ketones, ur: NEGATIVE mg/dL
Nitrite: NEGATIVE
Protein, ur: NEGATIVE mg/dL
Specific Gravity, Urine: 1.017 (ref 1.005–1.030)
pH: 7 (ref 5.0–8.0)

## 2022-01-30 LAB — RESPIRATORY PANEL BY PCR

## 2022-01-30 LAB — ECHOCARDIOGRAM COMPLETE
Area-P 1/2: 3.97 cm2
Height: 63 in
S' Lateral: 2.5 cm
Weight: 3472 oz

## 2022-01-30 LAB — COMPREHENSIVE METABOLIC PANEL
ALT: 16 U/L (ref 0–44)
AST: 24 U/L (ref 15–41)
Albumin: 3.1 g/dL — ABNORMAL LOW (ref 3.5–5.0)
Alkaline Phosphatase: 74 U/L (ref 38–126)
Anion gap: 11 (ref 5–15)
BUN: 9 mg/dL (ref 6–20)
CO2: 27 mmol/L (ref 22–32)
Calcium: 8.4 mg/dL — ABNORMAL LOW (ref 8.9–10.3)
Chloride: 100 mmol/L (ref 98–111)
Creatinine, Ser: 0.79 mg/dL (ref 0.44–1.00)
GFR, Estimated: >60 mL/min (ref >60)
Glucose, Bld: 92 mg/dL (ref 70–99)
Potassium: 3.7 mmol/L (ref 3.5–5.1)
Sodium: 138 mmol/L (ref 135–145)
Total Bilirubin: 0.7 mg/dL (ref 0.3–1.2)
Total Protein: 6.2 g/dL — ABNORMAL LOW (ref 6.5–8.1)

## 2022-01-30 LAB — CBC WITH DIFFERENTIAL/PLATELET
Basophils Absolute: 0 10*3/uL (ref 0.0–0.1)
Basophils Relative: 0 %
Eosinophils Absolute: 0.3 10*3/uL (ref 0.0–0.5)
Eosinophils Relative: 2 %
HCT: 23.7 % — ABNORMAL LOW (ref 36.0–46.0)
Hemoglobin: 7.9 g/dL — ABNORMAL LOW (ref 12.0–15.0)
Lymphocytes Relative: 34 %
Lymphs Abs: 5.2 10*3/uL — ABNORMAL HIGH (ref 0.7–4.0)
MCH: 34.3 pg — ABNORMAL HIGH (ref 26.0–34.0)
MCHC: 33.3 g/dL (ref 30.0–36.0)
MCV: 103 fL — ABNORMAL HIGH (ref 80.0–100.0)
Metamyelocytes Relative: 1 %
Monocytes Absolute: 0.2 10*3/uL (ref 0.1–1.0)
Monocytes Relative: 1 %
Myelocytes: 3 %
Neutro Abs: 6.2 10*3/uL (ref 1.7–7.7)
Neutrophils Relative %: 40 %
Other: 19 %
Platelets: 32 10*3/uL — ABNORMAL LOW (ref 150–400)
RBC: 2.3 MIL/uL — ABNORMAL LOW (ref 3.87–5.11)
RDW: 15.3 % (ref 11.5–15.5)
Smear Review: DECREASED
WBC: 15.4 10*3/uL — ABNORMAL HIGH (ref 4.0–10.5)
nRBC: 0.5 % — ABNORMAL HIGH (ref 0.0–0.2)
nRBC: 1 /100 WBC — ABNORMAL HIGH

## 2022-01-30 LAB — PATHOLOGIST SMEAR REVIEW

## 2022-01-30 LAB — IRON AND TIBC
Iron: 95 ug/dL (ref 28–170)
Saturation Ratios: 30 % (ref 10.4–31.8)
TIBC: 322 ug/dL (ref 250–450)
UIBC: 227 ug/dL

## 2022-01-30 LAB — CBC
HCT: 23.9 % — ABNORMAL LOW (ref 36.0–46.0)
Hemoglobin: 8.5 g/dL — ABNORMAL LOW (ref 12.0–15.0)
MCH: 36 pg — ABNORMAL HIGH (ref 26.0–34.0)
MCHC: 35.6 g/dL (ref 30.0–36.0)
MCV: 101.3 fL — ABNORMAL HIGH (ref 80.0–100.0)
Platelets: 30 10*3/uL — ABNORMAL LOW (ref 150–400)
RBC: 2.36 MIL/uL — ABNORMAL LOW (ref 3.87–5.11)
RDW: 15.1 % (ref 11.5–15.5)
WBC: 15.6 10*3/uL — ABNORMAL HIGH (ref 4.0–10.5)
nRBC: 0.4 % — ABNORMAL HIGH (ref 0.0–0.2)

## 2022-01-30 LAB — FERRITIN: Ferritin: 730 ng/mL — ABNORMAL HIGH (ref 11–307)

## 2022-01-30 LAB — PROTIME-INR
INR: 1.2 (ref 0.8–1.2)
Prothrombin Time: 15.5 seconds — ABNORMAL HIGH (ref 11.4–15.2)

## 2022-01-30 LAB — LACTATE DEHYDROGENASE: LDH: 333 U/L — ABNORMAL HIGH (ref 98–192)

## 2022-01-30 LAB — RETICULOCYTES
Immature Retic Fract: 17.7 % — ABNORMAL HIGH (ref 2.3–15.9)
RBC.: 2.35 MIL/uL — ABNORMAL LOW (ref 3.87–5.11)
Retic Count, Absolute: 15.3 10*3/uL — ABNORMAL LOW (ref 19.0–186.0)
Retic Ct Pct: 0.7 % (ref 0.4–3.1)

## 2022-01-30 LAB — VITAMIN B12: Vitamin B-12: 210 pg/mL (ref 180–914)

## 2022-01-30 LAB — SURGICAL PATHOLOGY

## 2022-01-30 MED ORDER — ADULT MULTIVITAMIN LIQUID CH
15.0000 mL | Freq: Every day | ORAL | Status: DC
  Administered 2022-01-30 – 2022-02-02 (×4): 15 mL via ORAL
  Filled 2022-01-30 (×4): qty 15

## 2022-01-30 MED ORDER — BUDESONIDE 0.5 MG/2ML IN SUSP
0.5000 mg | Freq: Two times a day (BID) | RESPIRATORY_TRACT | Status: DC
  Administered 2022-01-30 – 2022-02-02 (×5): 0.5 mg via RESPIRATORY_TRACT
  Filled 2022-01-30 (×6): qty 2

## 2022-01-30 MED ORDER — FAMOTIDINE 20 MG PO TABS
20.0000 mg | ORAL_TABLET | Freq: Two times a day (BID) | ORAL | Status: DC
  Administered 2022-01-30 – 2022-02-02 (×6): 20 mg via ORAL
  Filled 2022-01-30 (×6): qty 1

## 2022-01-30 MED ORDER — AZITHROMYCIN 500 MG PO TABS
500.0000 mg | ORAL_TABLET | Freq: Every day | ORAL | Status: DC
  Administered 2022-01-30: 500 mg via ORAL
  Filled 2022-01-30: qty 1

## 2022-01-30 MED ORDER — ARFORMOTEROL TARTRATE 15 MCG/2ML IN NEBU
15.0000 ug | INHALATION_SOLUTION | Freq: Two times a day (BID) | RESPIRATORY_TRACT | Status: DC
  Administered 2022-01-30 – 2022-02-02 (×5): 15 ug via RESPIRATORY_TRACT
  Filled 2022-01-30 (×6): qty 2

## 2022-01-30 MED ORDER — ENSURE ENLIVE PO LIQD
237.0000 mL | Freq: Three times a day (TID) | ORAL | Status: DC
  Administered 2022-01-30 – 2022-02-02 (×7): 237 mL via ORAL
  Filled 2022-01-30 (×2): qty 237

## 2022-01-30 MED ORDER — VITAMIN B-12 1000 MCG PO TABS
1000.0000 ug | ORAL_TABLET | Freq: Every day | ORAL | Status: DC
  Administered 2022-01-30 – 2022-02-02 (×4): 1000 ug via ORAL
  Filled 2022-01-30 (×4): qty 1

## 2022-01-30 MED ORDER — SODIUM CHLORIDE 0.9 % IV SOLN: 1.0000 g | INTRAVENOUS | Status: DC

## 2022-01-30 NOTE — Assessment & Plan Note (Addendum)
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  No bleeding, some bruising near blood draw sites noted on exam.  No current indication for transfusion.  Heme-onc consulted and onboard. - Daily CBC, trend platelets - Transfuse platelets <10 or if symptomatic

## 2022-01-30 NOTE — Assessment & Plan Note (Addendum)
Hgb 9.2>7.9, unchanged today.  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  No evidence of schistocytes or immature blasts on peripheral smear.  Anemia likely exacerbating dyspnea. - Monitor for active bleeding - Daily CBC (with diff), trend H&H - Transfuse Hgb<7.0

## 2022-01-30 NOTE — Progress Notes (Addendum)
Daily Progress Note Intern Pager: [phone]  Patient name: Amanda [name] Medical record number: [id number] Date of birth: April 02, 1965 Age: 57 y.o. Gender: female  Primary Care Provider: Binnie Rail, MD Consultants: Heme-onc, pulm Code Status: FULL  Pt Overview and Major Events to Date:  1/10 - Admitted, began CAP treatment 1/11 - CAP treatment discontinued, heme and pulm consulted  Assessment and Plan: Amanda [name] is a 57 y.o. female presenting with SOB, pleuritic chest pain, anemia, and thrombocytopenia.   Pertinent PMH/PSH includes asthma, allergic rhinitis, GERD, diet-controlled T2DM, depression.  * Acute hypoxic respiratory failure ([place]) Patient still having pleuritic chest pain, no dyspnea at rest on 2L O2 by [place].  Remains afebrile.  No autoimmune (skin, eye, joint) symptoms.  Etiology of hypoxia remains unclear and will consult pulmonology at this time.  Considering hemolysis as leading differential, particularly considering anemia and elevated LDH.  CXR and CTA notable for interstitial infiltrates but no lobar consolidation, thus inconsistent with typical pneumonia.  Atypical pneumonia possible, remains on differential.  Also considering endocarditis as do not miss Dx given recent dental infection and procedure, though lack of fevers make this less likely.  Does have documented history of murmur, but did not hear on physical exam.  PE was considered given recent travel and pleuritic chest pain, but less likely given negative CTA. - Consult pulmonology - 2L [place], wean as tolerated (no O2 @ baseline) - Continuous pulse ox - f/u echo - Tylenol prn for fever and pain - Discontinue antibiotics for CAP (see below) - Consider blood Cx if pt fevers - AM CBC and BMP  Anemia Hgb 9.2>7.9.  B12, iron studies, INR, and PT WNL.  Ferritin elevated, though is an acute phase reactant.  LDH is elevated and immature reticulocytes are high, potentially consistent with hemolysis.  Differential includes  hemolysis, GI bleed, malnutrition, and malignancy.  Possible that recent amoxicillin or Augmentin use has lead to autoimmune hemolysis.  Patient reports one episode of dark stool after taking Pepto Bismol a few weeks ago, but none since.  Also notes she previously had ulcer found with EGD several years ago.  Abdominal exam notable for epigastric tenderness.  Denies chronic NSAID use.  Patient reports being UTD on colonscopies, father had colon cancer at 75.  Has had poor nutrition 2/2 dental pain and discomfort.  GI bleed is considered given epigastric tenderness and episode of potential melena, but less likely given FOBT negative. Malnutrition is considered given pt hx of poor diet for past 6 months, but B12 normal.  Malignancy is considered given concomitant thrombocytopenia and leukocytosis, heme-onc is onboard. - Monitor for active bleeding - f/u peripheral blood smear - Daily CBC, trend H&H - UA with microscopy given hemolysis - Heme-onc recs  Thrombocytopenia (HCC) Incidentally noted to have thrombocytopenia on admission, now 43>32.  Etiology unclear.  No bleeding or bruising noted on exam, patient has not noticed such symptoms either.  No current indication for transfusion.  Heme-onc consulted and onboard. - f/u peripheral blood smear - Daily CBC, trend platelets - Heme-onc recs  Community acquired pneumonia CT and CXR not consistent with lobar pneumonia given lack of focal consolidation, will discontinue antibiotics.  Atypical pneumonia possible, though less likely given lack of fevers.  Also considered coccidiomycosis given recent travel to endemic region [place]).  If not improving, will consider further testing for coccidioidomycosis. - Discontinue CTX and azithromycin  Chronic and stable: Anxiety: Cont home Prozac HLD: Cont home rosuvastatin GERD: Cont home Pepcid  prn Paroxysmal Tachycardia: Cont home atenolol Asthma: Cont home albuterol PRN DDD: Cont home Norco prn  FEN/GI:  Regular diet, PIV x2 PPx: SCDs, no heparin given thrombocytopenia and anemia Dispo:Home pending clinical improvement . Barriers include heme workup and respiratory status (wean off O2).   Subjective:  This morning, patient states that her breathing is slightly improved and she feels like her cough is "looser" and she may be able to cough something up soon.  She notes some bilateral pain right around her ribs.  She has been getting up to go to the restroom and has felt only mild shortness of breath when ambulating.  She is very hungry this morning and breakfast arrived while we were talking.  Patient denies joint pains, eye issues/vision changes, and rashes.  Further discussed patient's history and learned that her father had fatal colon cancer at age 64.  She has remembered that she previously had an endoscopy and colonoscopy together several years ago and at that time she was diagnosed with an ulcer.  Patient requests that her PCP be notified she is hospitalized and PCP has been contacted at this time.  Objective: Temp:  [98.1 F (36.7 C)-98.9 F (37.2 C)] 98.3 F (36.8 C) (01/11 0756) Pulse Rate:  [77-92] 92 (01/11 0756) Resp:  [15-20] 16 (01/11 0756) BP: (124-145)/(51-79) 128/59 (01/11 0756) SpO2:  [88 %-97 %] 95 % (01/11 0756) Weight:  [98.4 kg] 98.4 kg (01/11 0315)  Physical Exam: General: Age-appropriate, resting comfortably in bed, NAD, alert and oriented x4. HEENT: MMM, normocephalic. PERRLA. Cardiovascular: Regular rate and rhythm. Normal S1/S2. No murmurs, rubs, or gallops appreciated. 2+ radial pulses. Pulmonary: Breath sounds quiet bilaterally.  Normal WOB on 2L O2 by [place], no accessory muscle usage. No wheezes, rales, or crackles.  Some pain reproducible with palpation over chest wall bilaterally. Abdominal: Normoactive bowel sounds. TTP over epigastrium. No rebound or guarding. Skin: Warm and dry. No rashes grossly. Extremities: No peripheral edema bilaterally, lower  extremities equal in diameter.  Laboratory: Most recent CBC Lab Results  Component Value Date   WBC 15.4 (H) 01/30/2022   HGB 7.9 (L) 01/30/2022   HCT 23.7 (L) 01/30/2022   MCV 103.0 (H) 01/30/2022   PLT 32 (L) 01/30/2022   Most recent BMP    Latest Ref Rng & Units 01/30/2022    2:05 AM  BMP  Glucose 70 - 99 mg/dL 92   BUN 6 - 20 mg/dL 9   Creatinine 0.44 - 1.00 mg/dL 0.79   Sodium 135 - 145 mmol/L 138   Potassium 3.5 - 5.1 mmol/L 3.7   Chloride 98 - 111 mmol/L 100   CO2 22 - 32 mmol/L 27   Calcium 8.9 - 10.3 mg/dL 8.4    Other pertinent labs: - Retics, LDH, haptoglobin: Pending - B12: WNL - INR and PT: Normal-high (borderline) - Iron studies: WNL - Ferritin: Elevated - Leukocytosis: 17.4 > 15.4 - Hgb: 9.2 > 7.2 - Platelets: 43 > 32  Imaging/Diagnostic Tests: - CXR: Mildly coarsened bibasilar interstitial markings, slightly more pronounced on the right, which may reflect bronchitic lung changes versus developing infiltrate. - CTA chest:  No pulmonary embolism seen.  Scattered mild areas of ground-glass changes in both lungs.  Possible subtle infiltrative process including atypical process. - CTAP w/ contrast:  Fatty liver infiltration.  Borderline spleen.  Splenorenal shunt blood vessel.  Some pancreatic enhancement not suggestive of pancreatitis. - FOBT: Negative - Peripheral blood smear: Pending  Shitarev, Dimitry, Medical Student 01/30/2022, 11:55 AM  I was personally present and performed or re-performed the history, physical exam and medical decision making activities of this service and have verified that the service and findings are accurately documented in the student's note.  Holley Bouche, MD                  01/30/2022, 1:31 PM   MS4, [facility] SOM, [place] Intern pager: [phone], text pages welcome Secure chat group: [facility] Teaching Service

## 2022-01-30 NOTE — Assessment & Plan Note (Addendum)
Patient still having pleuritic chest pain, no dyspnea at rest on 2L O2 by [place].  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  Remains afebrile.  Pulmonology did not find any specific cause of dyspnea yesterday, but likely related to anemia and AML.  Echo unremarkable (#F 60-65%) except benign tricuspid aortic valve. - 2L [place] (no O2 @ baseline) - Continuous pulse ox - Tylenol Q6H PRN, scheduled Norco for pain - AM CBC with diff

## 2022-01-30 NOTE — Plan of Care (Signed)
Patient provided with urine sample cup. Will call out when done.

## 2022-01-30 NOTE — Assessment & Plan Note (Deleted)
CT and CXR not consistent with lobar pneumonia given lack of focal consolidation, will discontinue antibiotics.  Atypical pneumonia possible, though less likely given lack of fevers.  Also considered coccidiomycosis given recent travel to endemic region [place]).  If not improving, will consider further testing for coccidioidomycosis. - Discontinue CTX and azithromycin

## 2022-01-30 NOTE — Plan of Care (Signed)

## 2022-01-30 NOTE — Progress Notes (Signed)
The results of the path review of the smear noted. I advised the primary team that the patient needs to be transferred to [facility] ASAP(am is fine, as the patient is stable). The primary team should use the physician acess line and ask for the leukemia service and arrange for the patient to be transferred. I will answer any questions the patient might have in the meantime

## 2022-01-30 NOTE — Progress Notes (Signed)
Spoke with Dr. 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 He advised that he will see patient tomorrow and that in the morning we will need to initiate transfer to [facility] as they do not treat AML here at [facility]. We will discuss new diagnosis with patient.

## 2022-01-30 NOTE — ED Notes (Signed)
ED TO INPATIENT HANDOFF REPORT  ED Nurse Name and Phone #: Cammie Mcgee, RN [phone]  S Name/Age/Gender Amanda [name] 57 y.o. female Room/Bed: 024C/024C  Code Status   Code Status: Full Code  Home/SNF/Other Home Patient oriented to: self, place, time, and situation Is this baseline? Yes   Triage Complete: Triage complete  Chief Complaint Acute hypoxic respiratory failure ([place]) [J96.01]  Triage Note Patient here with cmplaint of sharp central chest pain and shortness o breath that started a few days ago, history of oral surgery in December 2023. Denies smoking, is alert, oriented, and in no apparent distress at this time.   Allergies Allergies  Allergen Reactions   Monosodium Glutamate Shortness Of Breath   Cat Hair Extract     Other reaction(s): Eye redness    Level of Care/Admitting Diagnosis ED Disposition     ED Disposition  Admit   Condition  --   Comment  Hospital Area: [place] [[id number]]  Level of Care: Med-Surg [16]  May place patient in observation at [facility] or [facility] if equivalent level of care is available:: No  Covid Evaluation: Confirmed COVID Negative  Diagnosis: Acute hypoxic respiratory failure [facility]) [[id number]]  Admitting Physician: [place], [place]  Attending Physician: Lind Covert [[id number]]          B Medical/Surgery History Past Medical History:  Diagnosis Date   Allergy    rhinitis, seasonal   Anxiety    Asthma    extrinsic   GERD (gastroesophageal reflux disease)    Hemorrhoids    Hx of colonic polyps 2010   hyperplastic   Hyperglycemia    borderline   Hyperlipidemia    borderline   Tachycardia, paroxysmal ([place])    Dr Caryl Comes   Past Surgical History:  Procedure Laterality Date   ABDOMINAL HYSTERECTOMY  2004   For Abnormal Pap & painful menses, Dr Deatra Ina   COLONOSCOPY W/ POLYPECTOMY  11/2008   Dr Oletta Lamas   ESI     X 2; Dr Nelva Bush   UPPER GI ENDOSCOPY  04/2012   esophageal  erosions ; Dr Oletta Lamas   WISDOM TOOTH EXTRACTION  02/13/2009     A IV Location/Drains/Wounds Patient Lines/Drains/Airways Status     Active Line/Drains/Airways     Name Placement date Placement time Site Days   Peripheral IV 01/29/22 20 G Left Antecubital 01/29/22  1343  Antecubital  1            Intake/Output Last 24 hours  Intake/Output Summary (Last 24 hours) at 01/30/2022 0135 Last data filed at 01/29/2022 2344 Gross per 24 hour  Intake 350 ml  Output --  Net 350 ml    Labs/Imaging Results for orders placed or performed during the hospital encounter of 01/29/22 (from the past 48 hour(s))  Basic metabolic panel     Status: Abnormal   Collection Time: 01/29/22  1:45 PM  Result Value Ref Range   Sodium 136 135 - 145 mmol/L   Potassium 3.9 3.5 - 5.1 mmol/L   Chloride 98 98 - 111 mmol/L   CO2 30 22 - 32 mmol/L   Glucose, Bld 105 (H) 70 - 99 mg/dL    Comment: Glucose reference range applies only to samples taken after fasting for at least 8 hours.   BUN 6 6 - 20 mg/dL   Creatinine, Ser 0.67 0.44 - 1.00 mg/dL   Calcium 8.6 (L) 8.9 - 10.3 mg/dL   GFR, Estimated >60 >60 mL/min  Comment: (NOTE) Calculated using the CKD-EPI Creatinine Equation (2021)    Anion gap 8 5 - 15    Comment: Performed at [facility], [place] [address], [place], [place] [address]  CBC     Status: Abnormal   Collection Time: 01/29/22  1:45 PM  Result Value Ref Range   WBC 17.4 (H) 4.0 - 10.5 K/uL   RBC 2.67 (L) 3.87 - 5.11 MIL/uL   Hemoglobin 9.2 (L) 12.0 - 15.0 g/dL   HCT 26.7 (L) 36.0 - 46.0 %   MCV 100.0 80.0 - 100.0 fL   MCH 34.5 (H) 26.0 - 34.0 pg   MCHC 34.5 30.0 - 36.0 g/dL   RDW 14.9 11.5 - 15.5 %   Platelets 43 (L) 150 - 400 K/uL    Comment: Immature Platelet Fraction may be clinically indicated, consider ordering this additional test [id number] REPEATED TO VERIFY    nRBC 0.5 (H) 0.0 - 0.2 %    Comment: Performed at [facility], [place] [address], [place], [place]  [address]  Troponin I (High Sensitivity)     Status: None   Collection Time: 01/29/22  1:45 PM  Result Value Ref Range   Troponin I (High Sensitivity) <2 <18 ng/L    Comment: (NOTE) Elevated high sensitivity troponin I (hsTnI) values and significant  changes across serial measurements may suggest ACS but many other  chronic and acute conditions are known to elevate hsTnI results.  Refer to the "Links" section for chest pain algorithms and additional  guidance. Performed at [facility], [place] [address], [place], [place] [address]   Hepatic function panel     Status: Abnormal   Collection Time: 01/29/22  1:49 PM  Result Value Ref Range   Total Protein 7.3 6.5 - 8.1 g/dL   Albumin 3.3 (L) 3.5 - 5.0 g/dL   AST 30 15 - 41 U/L   ALT 21 0 - 44 U/L   Alkaline Phosphatase 73 38 - 126 U/L   Total Bilirubin 0.6 0.3 - 1.2 mg/dL   Bilirubin, Direct <0.1 0.0 - 0.2 mg/dL   Indirect Bilirubin NOT CALCULATED 0.3 - 0.9 mg/dL    Comment: Performed at [facility] [address] [place], [place] [address]  Lipase, blood     Status: None   Collection Time: 01/29/22  1:49 PM  Result Value Ref Range   Lipase 37 11 - 51 U/L    Comment: Performed at [facility] [address] [place], [place] [address]  D-dimer, quantitative     Status: Abnormal   Collection Time: 01/29/22  1:50 PM  Result Value Ref Range   D-Dimer, Quant 1.75 (H) 0.00 - 0.50 ug/mL-FEU    Comment: (NOTE) At the manufacturer cut-off value of 0.5 g/mL FEU, this assay has a negative predictive value of 95-100%.This assay is intended for use in conjunction with a clinical pretest probability (PTP) assessment model to exclude pulmonary embolism (PE) and deep venous thrombosis (DVT) in outpatients suspected of PE or DVT. Results should be correlated with clinical presentation. Performed at [facility], [place] [address], [place], [place] [address]   Type and screen [place]     Status: None    Collection Time: 01/29/22  6:30 PM  Result Value Ref Range   ABO/RH(D) B POS    Antibody Screen NEG    Sample Expiration      02/01/2022,2359 Performed at [facility], [address], [place], [place] [address]   Troponin I (High Sensitivity)     Status: None   Collection Time: 01/29/22  6:30 PM  Result Value Ref Range   Troponin I (High Sensitivity) 3 <18 ng/L    Comment: (NOTE) Elevated high sensitivity troponin I (hsTnI) values and significant  changes across serial measurements may suggest ACS but many other  chronic and acute conditions are known to elevate hsTnI results.  Refer to the "Links" section for chest pain algorithms and additional  guidance. Performed at [facility], [place] [address] [place], [place] [address]   Lactic acid, plasma     Status: None   Collection Time: 01/29/22  6:38 PM  Result Value Ref Range   Lactic Acid, Venous 1.5 0.5 - 1.9 mmol/L    Comment: Performed at [facility] [address] [place], [place] [address]  ABO/Rh     Status: None   Collection Time: 01/29/22  6:38 PM  Result Value Ref Range   ABO/RH(D)      B POS Performed at [facility] [address], [place], [place] [address]   Resp panel by RT-PCR (RSV, Flu A&B, Covid) Anterior Nasal Swab     Status: None   Collection Time: 01/29/22  8:06 PM   Specimen: Anterior Nasal Swab  Result Value Ref Range   SARS Coronavirus 2 by RT PCR NEGATIVE NEGATIVE    Comment: (NOTE) SARS-CoV-2 target nucleic acids are NOT DETECTED.  The SARS-CoV-2 RNA is generally detectable in upper respiratory specimens during the acute phase of infection. The lowest concentration of SARS-CoV-2 viral copies this assay can detect is 138 copies/mL. A negative result does not preclude SARS-Cov-2 infection and should not be used as the sole basis for treatment or other patient management decisions. A negative result may occur with  improper specimen collection/handling, submission of  specimen other than nasopharyngeal swab, presence of viral mutation(s) within the areas targeted by this assay, and inadequate number of viral copies(<138 copies/mL). A negative result must be combined with clinical observations, patient history, and epidemiological information. The expected result is Negative.  Fact Sheet for Patients:  EntrepreneurPulse.com.au  Fact Sheet for Healthcare Providers:  IncredibleEmployment.be  This test is no t yet approved or cleared by the [place] FDA and  has been authorized for detection and/or diagnosis of SARS-CoV-2 by FDA under an Emergency Use Authorization (EUA). This EUA will remain  in effect (meaning this test can be used) for the duration of the COVID-19 declaration under Section 564(b)(1) of the Act, 21 U.S.C.section 360bbb-3(b)(1), unless the authorization is terminated  or revoked sooner.       Influenza A by PCR NEGATIVE NEGATIVE   Influenza B by PCR NEGATIVE NEGATIVE    Comment: (NOTE) The Xpert Xpress SARS-CoV-2/FLU/RSV plus assay is intended as an aid in the diagnosis of influenza from Nasopharyngeal swab specimens and should not be used as a sole basis for treatment. Nasal washings and aspirates are unacceptable for Xpert Xpress SARS-CoV-2/FLU/RSV testing.  Fact Sheet for Patients: EntrepreneurPulse.com.au  Fact Sheet for Healthcare Providers: IncredibleEmployment.be  This test is not yet approved or cleared by the [place] FDA and has been authorized for detection and/or diagnosis of SARS-CoV-2 by FDA under an Emergency Use Authorization (EUA). This EUA will remain in effect (meaning this test can be used) for the duration of the COVID-19 declaration under Section 564(b)(1) of the Act, 21 U.S.C. section 360bbb-3(b)(1), unless the  authorization is terminated or revoked.     Resp Syncytial Virus by PCR NEGATIVE NEGATIVE    Comment:  (NOTE) Fact Sheet for Patients: EntrepreneurPulse.com.au  Fact Sheet for Healthcare Providers: IncredibleEmployment.be  This test is not yet approved or cleared by the [place] FDA and has been authorized for detection and/or diagnosis of SARS-CoV-2 by FDA under an Emergency Use Authorization (EUA). This EUA will remain in effect (meaning this test can be used) for the duration of the COVID-19 declaration under Section 564(b)(1) of the Act, 21 U.S.C. section 360bbb-3(b)(1), unless the authorization is terminated or revoked.  Performed at [facility], [place] [address], [place], [place] [address]   Lactic acid, plasma     Status: None   Collection Time: 01/29/22  8:10 PM  Result Value Ref Range   Lactic Acid, Venous 1.1 0.5 - 1.9 mmol/L    Comment: Performed at [facility] [address], [place], [place] [address]  Brain natriuretic peptide     Status: None   Collection Time: 01/29/22  8:10 PM  Result Value Ref Range   B Natriuretic Peptide 28.4 0.0 - 100.0 pg/mL    Comment: Performed at [facility] [address], [place], [place] [address]  POC occult blood, ED     Status: None   Collection Time: 01/29/22  8:21 PM  Result Value Ref Range   Fecal Occult Bld NEGATIVE NEGATIVE   CT ABDOMEN PELVIS W CONTRAST  Result Date: 01/29/2022 CLINICAL DATA:  Pancreatitis.  Abdominal pain EXAM: CT ABDOMEN AND PELVIS WITH CONTRAST TECHNIQUE: Multidetector CT imaging of the abdomen and pelvis was performed using the standard protocol following bolus administration of intravenous contrast. RADIATION DOSE REDUCTION: This exam was performed according to the departmental dose-optimization program which includes automated exposure control, adjustment of the mA and/or kV according to patient size and/or use of iterative reconstruction technique. CONTRAST:  67m OMNIPAQUE IOHEXOL 350 MG/ML SOLN COMPARISON:  CT October 2010 FINDINGS: Lower  chest: Please see separate dictation of chest CT scan from same day. Fatty liver infiltration identified. No mass lesion. Patent portal vein. Gallbladder is nondilated. Hepatobiliary: Mild fatty liver infiltration. No space-occupying lesion. Patent portal vein. Gallbladder is nondilated. Pancreas: Pancreas is mildly atrophic and has some areas of fatty replacement particularly towards the head and neck region. No adjacent inflammatory changes. No fluid collections. Spleen:Spleen has a longitudinal length of 12.7 cm, borderline in size. Preserved enhancement. Small splenule noted inferior and posterior along the splenic margin. Adrenals/Urinary Tract: Adrenal glands are preserved. Preserved renal enhancement. Slightly malrotated right kidney. No collecting system dilatation. Preserved contours of the urinary bladder. Stomach/Bowel: On this non oral contrast exam the large bowel is of normal course and caliber with scattered stool. Normal appendix. Stomach is nondilated. Small bowel is nondilated. No free air or free fluid. Vascular/Lymphatic: Normal caliber aorta and IVC. Mild atherosclerotic calcified plaque along the aorta and branch vessels. No specific abnormal lymph node enlargement identified in the abdomen or pelvis. A few prominent lymph nodes seen towards the porta hepatis that are less than a cm in short axis and not pathologic by size criteria. Some of these were seen on the prior study of 2010. Note is made of some collateral vessels in the upper abdomen near the margin of the stomach posteriorly with a splenorenal shunt appearing collateral. Splenic vein is patent. Reproductive: Uterus and bilateral adnexa are unremarkable. Other: No ascites. Slightly protuberant midline anterior abdominal wall with a tiny  fat containing umbilical hernia. Musculoskeletal: Mild degenerative changes along the spine. IMPRESSION: Preserved overall pancreatic enhancement with some mild areas of atrophy and fatty infiltration.  No adjacent inflammatory changes or fluid collections seen at this time. No CT sequela of pancreatitis. Few collateral vessels in the upper abdomen including a splenorenal shunt. Borderline spleen. Fatty liver infiltration. Please see separate dictation of chest CT examination. Electronically Signed   By: Jill Side M.D.   On: 01/29/2022 16:57   CT Angio Chest PE W and/or Wo Contrast  Result Date: 01/29/2022 CLINICAL DATA:  Chest pain and shortness of breath. EXAM: CT ANGIOGRAPHY CHEST WITH CONTRAST TECHNIQUE: Multidetector CT imaging of the chest was performed using the standard protocol during bolus administration of intravenous contrast. Multiplanar CT image reconstructions and MIPs were obtained to evaluate the vascular anatomy. RADIATION DOSE REDUCTION: This exam was performed according to the departmental dose-optimization program which includes automated exposure control, adjustment of the mA and/or kV according to patient size and/or use of iterative reconstruction technique. CONTRAST:  29m OMNIPAQUE IOHEXOL 350 MG/ML SOLN COMPARISON:  X-ray 01/29/2022 and earlier. FINDINGS: Cardiovascular: Normal caliber thoracic aorta. Minimal atherosclerotic change. Heart is nonenlarged. No significant pericardial effusion. There is some motion. This can limit evaluation of small and peripheral emboli. No pulmonary embolism otherwise identified. Mediastinum/Nodes: No specific abnormal lymph node enlargement seen in the axillary region. There is some prominent hilar nodes. Example on the right on series 4, image 59 measures 15 by 9 mm. Smaller left hilar nodes. No clear abnormal lymph node enlargement seen in the mediastinum. Normal caliber thoracic esophagus. Lungs/Pleura: Lung windows are without pneumothorax or effusion. No consolidation. However there are some ill-defined areas of mild ground-glass changes scattered in both lungs. Slightly more in the lower lobes. Few reticular areas. Small subpleural right  apical nodule posteriorly on series 5, image 23 measures 5 mm. Upper Abdomen: The adrenal glands are grossly preserved in the upper abdomen although incompletely included in the imaging field. Fatty liver infiltration. Musculoskeletal: Scattered degenerative changes along the spine. Presumed hemangioma within the vertebral body at T4. Small presumed bone island at T7. Review of the MIP images confirms the above findings. IMPRESSION: Mild breathing motion.  No pulmonary embolism poorly seen. Scattered mild areas of ground-glass changes in both lungs. Possible subtle infiltrative process including atypical process. There are a few prominent hilar nodes which could be reactive. Simple follow-up is recommended. Please correlate with symptoms. Please see separate dictation of the abdomen and pelvis CT. Electronically Signed   By: AJill SideM.D.   On: 01/29/2022 16:50   DG Chest 2 View  Result Date: 01/29/2022 CLINICAL DATA:  Chest pain, shortness of breath EXAM: CHEST - 2 VIEW COMPARISON:  03/15/2018 FINDINGS: The heart size and mediastinal contours are within normal limits. Mildly coarsened bibasilar interstitial markings, slightly more pronounced on the right. No pleural effusion or pneumothorax. The visualized skeletal structures are unremarkable. IMPRESSION: Mildly coarsened bibasilar interstitial markings, slightly more pronounced on the right, which may reflect bronchitic lung changes versus developing infiltrate. Electronically Signed   By: NDavina PokeD.O.   On: 01/29/2022 14:20    Pending Labs Unresulted Labs (From admission, onward)     Start     Ordered   01/30/22 0500  HIV Antibody (routine testing w rflx)  (HIV Antibody (Routine testing w reflex) panel)  Tomorrow morning,   R        01/29/22 2200   01/30/22 0500  Comprehensive metabolic panel  Tomorrow  morning,   R        01/29/22 2200   01/30/22 0500  CBC  Tomorrow morning,   R        01/29/22 2200   01/30/22 0500  Protime-INR   Tomorrow morning,   R        01/29/22 2200   01/30/22 0500  Ferritin  Tomorrow morning,   R        01/29/22 2200   01/30/22 0500  Iron and TIBC  Tomorrow morning,   R        01/29/22 2200   01/29/22 2157  Technologist smear review  (Technologist smear review)  Add-on,   AD       Question:  Clinical information:  Answer:  SOB, pleuritic chest pain. unexplained new thrombocytopenia and anemia   01/29/22 2200   01/29/22 2157  CBC with Differential/Platelet  (Technologist smear review)  Once,   R        01/29/22 2200   01/29/22 2155  Vitamin B12  Once,   R        01/29/22 2200            Vitals/Pain Today's Vitals   01/29/22 2130 01/29/22 2145 01/29/22 2345 01/30/22 0115  BP: (!) 127/51 126/60 129/69 134/70  Pulse: 84 81 89 84  Resp: '16 15  15  '$ Temp:   98.8 F (37.1 C) 98.8 F (37.1 C)  TempSrc:   Oral Oral  SpO2: 97% 96% 97% 96%  PainSc:   0-No pain     Isolation Precautions No active isolations  Medications Medications  acetaminophen (TYLENOL) tablet 650 mg (has no administration in time range)    Or  acetaminophen (TYLENOL) suppository 650 mg (has no administration in time range)  atenolol (TENORMIN) tablet 50 mg (has no administration in time range)  rosuvastatin (CRESTOR) tablet 5 mg (has no administration in time range)  FLUoxetine (PROZAC) capsule 40 mg (has no administration in time range)  famotidine (PEPCID) tablet 20 mg (has no administration in time range)  albuterol (VENTOLIN HFA) 108 (90 Base) MCG/ACT inhaler 2 puff (has no administration in time range)  HYDROcodone-acetaminophen (NORCO) 10-325 MG per tablet 1 tablet (has no administration in time range)  iohexol (OMNIPAQUE) 350 MG/ML injection 75 mL (75 mLs Intravenous Contrast Given 01/29/22 1637)  fluconazole (DIFLUCAN) tablet 150 mg (150 mg Oral Given 01/29/22 2005)  ketorolac (TORADOL) 15 MG/ML injection 15 mg (15 mg Intravenous Given 01/29/22 2005)  ipratropium-albuterol (DUONEB) 0.5-2.5 (3) MG/3ML  nebulizer solution 3 mL (3 mLs Nebulization Given 01/29/22 2139)  cefTRIAXone (ROCEPHIN) 1 g in sodium chloride 0.9 % 100 mL IVPB (0 g Intravenous Stopped 01/29/22 2235)  azithromycin (ZITHROMAX) 500 mg in sodium chloride 0.9 % 250 mL IVPB (0 mg Intravenous Stopped 01/29/22 2344)    Mobility walks Low fall risk   Focused Assessments Cardiac Assessment Handoff:    Lab Results  Component Value Date   CKTOTAL 113 11/03/2012   Lab Results  Component Value Date   DDIMER 1.75 (H) 01/29/2022   Does the Patient currently have chest pain? No    R Recommendations: See Admitting Provider Note  Report given to:   Additional Notes:

## 2022-01-30 NOTE — H&P (Addendum)
Hospital Admission History and Physical Service Pager: [phone]  Patient name: Amanda [name] Medical record number: [id number] Date of Birth: 20-Nov-1965 Age: 57 y.o. Gender: female  Primary Care Provider: Binnie Rail, MD Consultants: none Code Status: FULL Preferred Emergency Contact: spouse [place]     Name Relation Home Work Mobile   Reep,Gregg Spouse [place] Son [phone]     Charlane, Westry   [phone]        Chief Complaint: dyspnea  Assessment and Plan: Noreta B Sian is a 57 y.o. female presenting with SOB, pleuritic chest pain, anemia, and thrombocytopenia.   * Acute hypoxic respiratory failure (HCC) Pt presents w/ 1-2 wk hx of pleuritic chest pain and SOB. Of note, pt recently completed 2 rounds of amoxicillin and 2 rounds of Augmentin in the past month (consecutively) for dental/sinus infection. She also underwent a dental procedure at this time (maybe root canal). She also took a long road trip to and back from [facility] prior to SOB and pleuritic chest pain starting, pt initially thought it was from the antibiotics making her feel weird. She has had a subjective fever, but no recorded fevers during this time. Denies congestion, sore throat. Pt is now requiring 2L [place] (no oxygen '@baseline'$ ). CXR and CTA notable for potential developing PNA. WBC elevated. Differential includes PNA, PE, and endocarditis. PNA is most likely given new O2 requirement, leukocytosis, and imaging c/f developing PNA. PE is considered given recent travel and pleuritic chest pain, but less likely given negative CTA. Endocarditis is considered given recent dental infection and procedure, but less likely given lack of murmur on exam and lack of fever. ACS workup was negative. - Admit to [facility] w/ attending Dr. Erin Hearing - 2L , wean as tolerated (no O2 @ baseline) - Tylenol prn for fever and pain - Antibiotics for CAP (see below) - Consider Bcx  and echo if pt fevers - if not improving with antibiotics, consider further testing for coccidioidomycosis given recent travel to endemic region - Vitals per unit - AM CBC and CMP  Anemia Hgb incidentally noted to be 9.2. Pt reports one episode of dark stool after taking Pepto Bismol a few weeks ago, but none since. Abm exam notable for epigastric tenderness. Denies chronic NSAID use. Pt reports being UTD on colonscopies. Pt also reports poor diet for the past 6 months given her dentition. Differential includes GI bleed, malnutrition, and malignancy. GI bleed is considered given epigastric tenderness and episode of potential melena, but less likely given FOBT negative. Malnutrition is considered given pt hx of poor diet for past 6 months. Malignancy is considered given concomitant thrombocytopenia and leukocytosis. - monitor for sources of active bleed - f/u blood smear - AM CBC, PT/INR, iron studies, B12  Thrombocytopenia (HCC) Incidentally noted to have plt 43, etiology unclear. No bleeding or bruising noted on exam. No current indication for transfusion. - f/u blood smear - AM CBC  Community acquired pneumonia CT and CXR c/f developing PNA. WBC elevated. Notably, pt has been on Augmentin and just finished this past Monday (2 days ago).  - Cont CTX (likely 5 day course) - Cont Azithro (likely 3 day course)   Chronic Conditions: Anxiety: Cont home Prozac.  HLD: Cont home Rosuvastatin GERD: Cont home pepcid Paroxysmal Tachycardia: Cont home atenolol Asthma: cont home prn albuterol DDD: Cont home Norco  FEN/GI: Regular VTE Prophylaxis: SCD's   Disposition: Med Surg  History of  Present Illness:  Amanda [name] is a 57 y.o. female presenting with dyspnea.  Patient reports she developed dyspnea and pleuritic chest pain that started about 1-2 weeks ago while she was in [place], [place]. She did have a subjective fever at some point in last week. Spouse reports that she has been coughing  for the past 2 days. She had a negative Covid test. She has a history of asthma but has not been needing to use her albuterol. She did try albuterol but did not notice any difference in her symptoms. She has had some burning epigastric pain as well. She noticed one day of dark stools after taking Pepto-Bismol. They recently came back from [place] 3 days ago (they drove back home). Denies congestion, rhinorrhea, sore throat, hemoptysis. Reports she is UTD on colon cancer screening (colonoscopy). No regular NSAID use.  She reports that she has been treated with antibiotics (amoxicillin, then amoxicillin-clavulanate) continuously for the past month for sinusitis. She was initially placed on Amox x10days for prophylaxis for root canal, but then was told she had a sinus infection and was placed on a higher dose of amox. However, she ended up getting some dental procedure still done despite this infection, afterwards she was in a lot of pain. Then was started on Augmentin for the sinus infection for 10 days, then course was prolonged for another 10 days.  Patient reports she has not been eating well for the past 6 months due to her dentition. She sometimes misses meals.   In the ED, CTA was negative for PE but found possible potential developing PNA. Pt also found to have leukocytosis, anemia, and thrombocytopenia. Pt desatted to 38, subsequently placed on 2L [place].   Review Of Systems: Per HPI with the following additions:  Review of Systems  Constitutional:  Positive for fever (subjective).  Respiratory:  Positive for shortness of breath.   Cardiovascular:  Positive for chest pain.  Gastrointestinal:  Positive for abdominal pain (epigastric) and nausea.     Pertinent Past Medical History: Asthma, GERD, anxiety, HLD, T2DM, paroxysmal tachycardia No history of COPD Remainder reviewed in history tab.   Pertinent Past Surgical History: Abm hysterectomy  Remainder reviewed in history tab.   Pertinent  Social History: Tobacco use: Yes/No/Former - smoked for 30 years but quit 10 years ago Alcohol use: denies Other Substance use: denies Lives with husband, dog  Pertinent Family History: Mother: CAD, HLD Father: diabetes, colon cancer Brother: diabetes  Remainder reviewed in history tab.   Important Outpatient Medications: Albuterol, atenolol, pepcid, Prozac, Norco, Crestor. - Took meds this morning  Remainder reviewed in medication history.   Objective: BP 129/69   Pulse 89   Temp 98.8 F (37.1 C) (Oral)   Resp 15   SpO2 97%  Exam: General: Alert, pleasant, speaking in full sentences on 2L [place]. Laying comfortably in bed. NAD. Eyes: PERRLE ENTM: NCAT. MMM. Poor dentition. Cardiovascular: RRR, no murmurs. Cap refill <2 Respiratory: CTAB, no crackles or wheezing. Normal WOB on 2L [place]. Gastrointestinal: Tender to palpation in epigastrium. Soft, nondistended. Normal BS.  Ext: No pitting edema Neuro: Alert and grossly oriented, responding appropriately.  Psych: Mood and affect congruent.  Labs:  CBC BMET  Recent Labs  Lab 01/29/22 1345  WBC 17.4*  HGB 9.2*  HCT 26.7*  PLT 43*    Recent Labs  Lab 01/29/22 1345  NA 136  K 3.9  CL 98  CO2 30  BUN 6  CREATININE 0.67  GLUCOSE 105*  CALCIUM 8.6*     FOBT neg BNP wnl Lactic acid wnl RPP neg Trop wnl D dimer 1.75 (elevated)  Imaging Studies Performed:  CTA:  Mild breathing motion.  No pulmonary embolism poorly seen.   Scattered mild areas of ground-glass changes in both lungs. Possible  subtle infiltrative process including atypical process. There are a  few prominent hilar nodes which could be reactive. Simple follow-up  is recommended. Please correlate with symptoms.   CT-AP: Preserved overall pancreatic enhancement with some mild areas of  atrophy and fatty infiltration. No adjacent inflammatory changes or  fluid collections seen at this time. No CT sequela of pancreatitis.   Few collateral vessels  in the upper abdomen including a splenorenal  shunt. Borderline spleen. Fatty liver infiltration.   CXR: Mildly coarsened bibasilar interstitial markings, slightly more  pronounced on the right, which may reflect bronchitic lung changes  versus developing infiltrate.  - My interpretation: increased bibasilar interstitial markings, but no obvious consolidations.   Arlyce Dice, MD 01/30/2022, 12:59 AM PGY-1, [place] Intern pager: [phone], text pages welcome Secure chat group [facility] Teaching Service   Upper Level Addendum:  I have reviewed the above note, making necessary revisions as appropriate.  I agree with the medical decision making and physical exam by the resident as noted above.  Will treat for CAP for now, though interestingly she recently completed antibiotics that would cover for pneumonia. Further work-up for new anemia and thrombocytopenia.  Zola Button, MD PGY-3 [facility]

## 2022-01-30 NOTE — Consult Note (Signed)
Reason for the request:   Anemia, thrombocytopenia with leukocytosis  HPI: I was asked by Dr. Erin Hearing to evaluate Amanda [name] for the evaluation of abnormal CBC.  She is a 57 year old woman without any significant comorbid condition who has undergone dental work including teeth extraction 6 months ago and possible insertion of implants and root canal in the last few weeks.  However due to sinus infection she was started on antibiotics initially 2 weeks of amoxicillin and subsequently 2 weeks of Augmentin for about 4 weeks.  Towards the end of December, she took a trip to [facility] with a drive via [place]. She spent few weeks in that area including travel to [place] although did not do an extensive amount of hiking in that area.  She reports that she was feeling poorly even before her trip and was more fatigued and tired throughout her trip.  She returned to the area and was attempting to go back to work clinical started having symptoms of of chest pain and difficulty breathing.  CT scan was negative for PE although did show mild areas of groundglass in both lungs and abdominal lymph node.  She had no evidence of splenomegaly or other acute abnormality and CT scan of the abdomen.   CBC obtained on January 29, 2022 showed a hemoglobin of 9.2, white cell count of 17.4 and platelet count of 43.  She had a normal CBC in May 2023.  Repeat CBC on January 30, 2021 showed a white cell count of 15.4, hemoglobin of 7.9 and platelets of 32.  Her MCV is 103 with low reticulocyte count.  She has mild elevation in her LDH 333, normal chemistry and electrolytes, normal liver function test.  Is slightly declining albumin and total protein.  Her white cell count differential showed essentially normal differential with increased monocytes and metamyelocytes.  Peripheral smear does not show any evidence of immature blasts or red cell fragmentation or schistocytes.  Platelet morphology was normal although decreased in number.      Clinically, she reports overall fatigue and tiredness with abdominal discomfort at times.  She denies any chest pain but does report dyspnea on exertion.  She denies any hematochezia melena or hemoptysis or hematemesis.    Past Medical History:  Diagnosis Date   Allergy    rhinitis, seasonal   Anxiety    Asthma    extrinsic   GERD (gastroesophageal reflux disease)    Hemorrhoids    Hx of colonic polyps 2010   hyperplastic   Hyperglycemia    borderline   Hyperlipidemia    borderline   Tachycardia, paroxysmal (HCC)    Dr Caryl Comes  :   Past Surgical History:  Procedure Laterality Date   ABDOMINAL HYSTERECTOMY  2004   For Abnormal Pap & painful menses, Dr Deatra Ina   COLONOSCOPY W/ POLYPECTOMY  11/2008   Dr Oletta Lamas   ESI     X 2; Dr Nelva Bush   UPPER GI ENDOSCOPY  04/2012   esophageal erosions ; Dr Skip Mayer TOOTH EXTRACTION  02/13/2009  :   Current Facility-Administered Medications:    acetaminophen (TYLENOL) tablet 650 mg, 650 mg, Oral, Q6H PRN **OR** acetaminophen (TYLENOL) suppository 650 mg, 650 mg, Rectal, Q6H PRN, Arlyce Dice, MD   albuterol (VENTOLIN HFA) 108 (90 Base) MCG/ACT inhaler 2 puff, 2 puff, Inhalation, Q4H PRN, Arlyce Dice, MD   atenolol (TENORMIN) tablet 50 mg, 50 mg, Oral, Daily, Arlyce Dice, MD, 50 mg at 01/30/22 0837   cyanocobalamin (VITAMIN  B12) tablet 1,000 mcg, 1,000 mcg, Oral, Daily, Ollis, Brandi L, NP   famotidine (PEPCID) tablet 20 mg, 20 mg, Oral, Daily PRN, Arlyce Dice, MD   feeding supplement (ENSURE ENLIVE / ENSURE PLUS) liquid 237 mL, 237 mL, Oral, TID BM, Ollis, Brandi L, NP   FLUoxetine (PROZAC) capsule 40 mg, 40 mg, Oral, Daily, Arlyce Dice, MD, 40 mg at 01/30/22 0837   HYDROcodone-acetaminophen (NORCO) 10-325 MG per tablet 1 tablet, 1 tablet, Oral, Q6H PRN, Arlyce Dice, MD, 1 tablet at 01/30/22 0304   multivitamin liquid 15 mL, 15 mL, Oral, Daily, Ollis, Brandi L, NP   rosuvastatin (CRESTOR) tablet 5 mg, 5 mg, Oral, Daily,  Arlyce Dice, MD, 5 mg at 01/30/22 0837:   Allergies  Allergen Reactions   Monosodium Glutamate Shortness Of Breath   Cat Hair Extract     Other reaction(s): Eye redness  :   Family History  Problem Relation Age of Onset   Colon cancer Father        Stage 4   Diabetes Father    Coronary artery disease Mother        angioplasty in late 8s   Hyperlipidemia Mother    Depression Brother    Bipolar disorder Brother    Diabetes Brother    Colon cancer Paternal Uncle    Lung cancer Paternal Grandfather    Colon cancer Paternal Grandfather    Breast cancer Other        MGAunts   Stroke Neg Hx   :   Social History   Socioeconomic History   Marital status: Married    Spouse name: Not on file   Number of children: Not on file   Years of education: Not on file   Highest education level: Not on file  Occupational History   Not on file  Tobacco Use   Smoking status: Former    Types: Cigarettes    Quit date: 12/25/2011    Years since quitting: 10.1   Smokeless tobacco: Never   Tobacco comments:     smoked 1982-2013,less than 1 pack a day   Substance and Sexual Activity   Alcohol use: Yes    Comment: Rarely   Drug use: No   Sexual activity: Yes    Birth control/protection: None  Other Topics Concern   Not on file  Social History Narrative   Regular exercise- no   Social Determinants of Health   Financial Resource Strain: Not on file  Food Insecurity: No Food Insecurity (01/30/2022)   Hunger Vital Sign    Worried About Running Out of Food in the Last Year: Never true    Ran Out of Food in the Last Year: Never true  Transportation Needs: No Transportation Needs (01/30/2022)   PRAPARE - Hydrologist (Medical): No    Lack of Transportation (Non-Medical): No  Physical Activity: Not on file  Stress: Not on file  Social Connections: Not on file  Intimate Partner Violence: Not At Risk (01/30/2022)   Humiliation, Afraid, Rape, and Kick  questionnaire    Fear of Current or Ex-Partner: No    Emotionally Abused: No    Physically Abused: No    Sexually Abused: No  :  Pertinent items are noted in HPI.  Exam: Blood pressure (!) 128/59, pulse 92, temperature 98.3 F (36.8 C), temperature source Oral, resp. rate 16, height '5\' 3"'$  (1.6 m), weight 217 lb (98.4 kg), SpO2 95 %.  General appearance: alert and cooperative  appeared without distress. Head: atraumatic without any abnormalities. Eyes: conjunctivae/corneas clear. PERRL.  Sclera anicteric. Throat: Edentulous without any evidence of oral ulcers or lesions or bleeding. Resp: clear to auscultation bilaterally without rhonchi, wheezes or dullness to percussion. Cardio: regular rate and rhythm, S1, S2 normal, no murmur, click, rub or gallop GI: soft, non-tender; bowel sounds normal; no masses,  no organomegaly Skin: Skin color, texture, turgor normal. No rashes or lesions Lymph nodes: Cervical, supraclavicular, and axillary nodes normal. Neurologic: Grossly normal without any motor, sensory or deep tendon reflexes. Musculoskeletal: No joint deformity or effusion.   Recent Labs    01/29/22 1345 01/30/22 0205  WBC 17.4* 15.4*  HGB 9.2* 7.9*  HCT 26.7* 23.7*  PLT 43* 32*    Recent Labs    01/29/22 1345 01/30/22 0205  NA 136 138  K 3.9 3.7  CL 98 100  CO2 30 27  GLUCOSE 105* 92  BUN 6 9  CREATININE 0.67 0.79  CALCIUM 8.6* 8.4*      ECHOCARDIOGRAM COMPLETE  Result Date: 01/30/2022    ECHOCARDIOGRAM REPORT   Patient Name:   Amanda [name] Date of Exam: 01/30/2022 Medical Rec #:  [id number]  Height:       63.0 in Accession #:    [id number] Weight:       217.0 lb Date of Birth:  31-Jan-1965 BSA:          2.002 m Patient Age:    26 years   BP:           128/59 mmHg Patient Gender: F          HR:           92 bpm. Exam Location:  Inpatient Procedure: 2D Echo, Cardiac Doppler and Color Doppler Indications:    Murmur R01.1  History:        Patient has no prior history of  Echocardiogram examinations.                 Arrythmias:Tachycardia; Risk Factors:Dyslipidemia. GERD. Asthma.                 Pneumonia.  Sonographer:    Darlina Sicilian RDCS Referring Phys: [place]  1. Left ventricular ejection fraction, by estimation, is 60 to 65%. The left ventricle has normal function. The left ventricle has no regional wall motion abnormalities. There is mild concentric left ventricular hypertrophy. Left ventricular diastolic parameters were normal.  2. Right ventricular systolic function is normal. The right ventricular size is normal. Tricuspid regurgitation signal is inadequate for assessing PA pressure.  3. The mitral valve is normal in structure. Trivial mitral valve regurgitation. No evidence of mitral stenosis.  4. The aortic valve is tricuspid. Aortic valve regurgitation is not visualized. No aortic stenosis is present.  5. The inferior vena cava is normal in size with greater than 50% respiratory variability, suggesting right atrial pressure of 3 mmHg. Comparison(s): No prior Echocardiogram. FINDINGS  Left Ventricle: Left ventricular ejection fraction, by estimation, is 60 to 65%. The left ventricle has normal function. The left ventricle has no regional wall motion abnormalities. The left ventricular internal cavity size was normal in size. There is  mild concentric left ventricular hypertrophy. Left ventricular diastolic parameters were normal. Right Ventricle: The right ventricular size is normal. No increase in right ventricular wall thickness. Right ventricular systolic function is normal. Tricuspid regurgitation signal is inadequate for assessing PA pressure. Left Atrium: Left atrial size was normal in size.  Right Atrium: Right atrial size was normal in size. Pericardium: There is no evidence of pericardial effusion. Mitral Valve: The mitral valve is normal in structure. Trivial mitral valve regurgitation. No evidence of mitral valve stenosis.  Tricuspid Valve: The tricuspid valve is normal in structure. Tricuspid valve regurgitation is trivial. No evidence of tricuspid stenosis. Aortic Valve: The aortic valve is tricuspid. Aortic valve regurgitation is not visualized. No aortic stenosis is present. Pulmonic Valve: The pulmonic valve was grossly normal. Pulmonic valve regurgitation is trivial. No evidence of pulmonic stenosis. Aorta: The aortic root and ascending aorta are structurally normal, with no evidence of dilitation. Venous: The inferior vena cava is normal in size with greater than 50% respiratory variability, suggesting right atrial pressure of 3 mmHg. IAS/Shunts: The atrial septum is grossly normal.  LEFT VENTRICLE PLAX 2D LVIDd:         4.40 cm   Diastology LVIDs:         2.50 cm   LV e' medial:    7.80 cm/s LV PW:         1.10 cm   LV E/e' medial:  15.6 LV IVS:        1.10 cm   LV e' lateral:   10.40 cm/s LVOT diam:     1.80 cm   LV E/e' lateral: 11.7 LV SV:         59 LV SV Index:   30 LVOT Area:     2.54 cm  RIGHT VENTRICLE RV S prime:     13.10 cm/s TAPSE (M-mode): 2.7 cm LEFT ATRIUM             Index        RIGHT ATRIUM           Index LA diam:        3.50 cm 1.75 cm/m   RA Area:     14.30 cm LA Vol (A2C):   30.6 ml 15.29 ml/m  RA Volume:   35.30 ml  17.63 ml/m LA Vol (A4C):   37.2 ml 18.58 ml/m LA Biplane Vol: 35.3 ml 17.63 ml/m  AORTIC VALVE LVOT Vmax:   115.00 cm/s LVOT Vmean:  74.300 cm/s LVOT VTI:    0.233 m  AORTA Ao Root diam: 2.60 cm Ao Asc diam:  2.30 cm MITRAL VALVE MV Area (PHT): 3.97 cm     SHUNTS MV Decel Time: 191 msec     Systemic VTI:  0.23 m MV E velocity: 122.00 cm/s  Systemic Diam: 1.80 cm MV A velocity: 60.00 cm/s MV E/A ratio:  2.03 Gwyndolyn Kaufman MD Electronically signed by Gwyndolyn Kaufman MD Signature Date/Time: 01/30/2022/1:01:45 PM    Final    CT ABDOMEN PELVIS W CONTRAST  Result Date: 01/29/2022 CLINICAL DATA:  Pancreatitis.  Abdominal pain EXAM: CT ABDOMEN AND PELVIS WITH CONTRAST TECHNIQUE:  Multidetector CT imaging of the abdomen and pelvis was performed using the standard protocol following bolus administration of intravenous contrast. RADIATION DOSE REDUCTION: This exam was performed according to the departmental dose-optimization program which includes automated exposure control, adjustment of the mA and/or kV according to patient size and/or use of iterative reconstruction technique. CONTRAST:  64m OMNIPAQUE IOHEXOL 350 MG/ML SOLN COMPARISON:  CT October 2010 FINDINGS: Lower chest: Please see separate dictation of chest CT scan from same day. Fatty liver infiltration identified. No mass lesion. Patent portal vein. Gallbladder is nondilated. Hepatobiliary: Mild fatty liver infiltration. No space-occupying lesion. Patent portal vein. Gallbladder is nondilated. Pancreas: Pancreas is mildly  atrophic and has some areas of fatty replacement particularly towards the head and neck region. No adjacent inflammatory changes. No fluid collections. Spleen:Spleen has a longitudinal length of 12.7 cm, borderline in size. Preserved enhancement. Small splenule noted inferior and posterior along the splenic margin. Adrenals/Urinary Tract: Adrenal glands are preserved. Preserved renal enhancement. Slightly malrotated right kidney. No collecting system dilatation. Preserved contours of the urinary bladder. Stomach/Bowel: On this non oral contrast exam the large bowel is of normal course and caliber with scattered stool. Normal appendix. Stomach is nondilated. Small bowel is nondilated. No free air or free fluid. Vascular/Lymphatic: Normal caliber aorta and IVC. Mild atherosclerotic calcified plaque along the aorta and branch vessels. No specific abnormal lymph node enlargement identified in the abdomen or pelvis. A few prominent lymph nodes seen towards the porta hepatis that are less than a cm in short axis and not pathologic by size criteria. Some of these were seen on the prior study of 2010. Note is made of some  collateral vessels in the upper abdomen near the margin of the stomach posteriorly with a splenorenal shunt appearing collateral. Splenic vein is patent. Reproductive: Uterus and bilateral adnexa are unremarkable. Other: No ascites. Slightly protuberant midline anterior abdominal wall with a tiny fat containing umbilical hernia. Musculoskeletal: Mild degenerative changes along the spine. IMPRESSION: Preserved overall pancreatic enhancement with some mild areas of atrophy and fatty infiltration. No adjacent inflammatory changes or fluid collections seen at this time. No CT sequela of pancreatitis. Few collateral vessels in the upper abdomen including a splenorenal shunt. Borderline spleen. Fatty liver infiltration. Please see separate dictation of chest CT examination. Electronically Signed   By: Jill Side M.D.   On: 01/29/2022 16:57   CT Angio Chest PE W and/or Wo Contrast  Result Date: 01/29/2022 CLINICAL DATA:  Chest pain and shortness of breath. EXAM: CT ANGIOGRAPHY CHEST WITH CONTRAST TECHNIQUE: Multidetector CT imaging of the chest was performed using the standard protocol during bolus administration of intravenous contrast. Multiplanar CT image reconstructions and MIPs were obtained to evaluate the vascular anatomy. RADIATION DOSE REDUCTION: This exam was performed according to the departmental dose-optimization program which includes automated exposure control, adjustment of the mA and/or kV according to patient size and/or use of iterative reconstruction technique. CONTRAST:  25m OMNIPAQUE IOHEXOL 350 MG/ML SOLN COMPARISON:  X-ray 01/29/2022 and earlier. FINDINGS: Cardiovascular: Normal caliber thoracic aorta. Minimal atherosclerotic change. Heart is nonenlarged. No significant pericardial effusion. There is some motion. This can limit evaluation of small and peripheral emboli. No pulmonary embolism otherwise identified. Mediastinum/Nodes: No specific abnormal lymph node enlargement seen in the  axillary region. There is some prominent hilar nodes. Example on the right on series 4, image 59 measures 15 by 9 mm. Smaller left hilar nodes. No clear abnormal lymph node enlargement seen in the mediastinum. Normal caliber thoracic esophagus. Lungs/Pleura: Lung windows are without pneumothorax or effusion. No consolidation. However there are some ill-defined areas of mild ground-glass changes scattered in both lungs. Slightly more in the lower lobes. Few reticular areas. Small subpleural right apical nodule posteriorly on series 5, image 23 measures 5 mm. Upper Abdomen: The adrenal glands are grossly preserved in the upper abdomen although incompletely included in the imaging field. Fatty liver infiltration. Musculoskeletal: Scattered degenerative changes along the spine. Presumed hemangioma within the vertebral body at T4. Small presumed bone island at T7. Review of the MIP images confirms the above findings. IMPRESSION: Mild breathing motion.  No pulmonary embolism poorly seen. Scattered mild areas of  ground-glass changes in both lungs. Possible subtle infiltrative process including atypical process. There are a few prominent hilar nodes which could be reactive. Simple follow-up is recommended. Please correlate with symptoms. Please see separate dictation of the abdomen and pelvis CT. Electronically Signed   By: Jill Side M.D.   On: 01/29/2022 16:50   DG Chest 2 View  Result Date: 01/29/2022 CLINICAL DATA:  Chest pain, shortness of breath EXAM: CHEST - 2 VIEW COMPARISON:  03/15/2018 FINDINGS: The heart size and mediastinal contours are within normal limits. Mildly coarsened bibasilar interstitial markings, slightly more pronounced on the right. No pleural effusion or pneumothorax. The visualized skeletal structures are unremarkable. IMPRESSION: Mildly coarsened bibasilar interstitial markings, slightly more pronounced on the right, which may reflect bronchitic lung changes versus developing infiltrate.  Electronically Signed   By: Davina Poke D.O.   On: 01/29/2022 14:20    Assessment and Plan:   57 year old with:  1.  Macrocytic anemia associated with severe thrombocytopenia and leukocytosis.  She presented with a hemoglobin of 9.2 and currently has a hemoglobin of 7.9.  Platelet count currently at 32 with MCV 103.  She has nucleated red cell and the smear, decreased reticulocyte count and mild elevation in her LDH.  The differential diagnosis of these hematological findings were discussed at this time.  Reactive findings related to sinopulmonary infection versus drug related myelosuppression from Augmentin and amoxicillin are the more likely consideration.  Other infectious etiologies could be also considered such as acute sinus infection and less likely [place] fever.  Coccidiosis infection would be less likely given the atypical presentation at this time but certainly a consideration.  Primary hematological disorder is certainly on the differential diagnosis at this time.  Myeloproliferative disorder such as CML, myelofibrosis is a consideration.  The lack of splenomegaly certainly goes against these findings.  Acute leukemia is considered less likely at this time.  From a management standpoint, I recommend supportive care at this time potentially changing of her antibiotics if she needs 1 at all.  If her counts do not improve in the next 24 to 48 hours a bone marrow biopsy could be considered.  In the meantime, I recommended supportive care with supportive packed red cell transfusion if she becomes symptomatic or hemoglobin less than 7.  Platelet transfusion can be also given if her platelet is less than 10 or active bleeding is noted.  I will also obtain molecular testing for BCR/ABL and JAK2 mutation to evaluate for myeloproliferative disorder.  2.  Respiratory failure: Likely exacerbated by her anemia in addition to history of tobacco use.   3.  Disposition and follow-up: We will  continue to follow her progress during her hospitalization.  80  minutes were dedicated to this visit.  50% time was face-to-face.  The time was spent on reviewing laboratory data, imaging studies, discussing treatment options, discussing differential diagnosis and answering questions regarding future plan.

## 2022-01-30 NOTE — Consult Note (Addendum)
Amanda [name], MRN:  [id number], DOB:  [birth year], LOS: 0 ADMISSION DATE:  01/29/2022, CONSULTATION DATE:  01/30/21 REFERRING MD:  Dr. Erin Hearing, CHIEF COMPLAINT:  SOB   History of Present Illness:  57 y/o F who presented to [facility] ER on 1/10 with reports of shortness of breath and pain.  At baseline, the patient lives with her husband.  They have children in [place] and and [place].  She started smoking around age 36 and quit in 2014.  At her heaviest she said 1 pack/day.  Since that time she vapes, noting 3 mg of nicotine per day.  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  Since COVID she has been largely sedentary and does not leave the house very often.  She reports she is up-to-date on her cancer screening except for mammogram.  The patient reports she has had dental issues in the last 6 months.  She had to have her upper teeth removed and dental implants placed.  She reports she has been on Augmentin for 4 weeks.  While she was on Augmentin she noted difficulty swallowing the tablets with sensation of them being hung in her mid chest, burning in her chest and reflux.  She has taken Pepto-Bismol and famotidine intermittently to help with heartburn and sensation of indigestion.  Patient reports that the difficulties with taking the pills became so severe that she stopped taking the medications with 3 days left in her 4-week treatment plan.  She notes that she has had concerns for possible sinus infection during this time.  Due to dental difficulties with eating she has largely been consuming cottage cheese and fruit that are soft.  She has not had variety in her diet in the last 6 months.  Her husband notes that she is paler than normal.  She and her husband recently drove to [place] and then to [place] and back to visit with their children.  She denies calf pain or swelling.  On presentation she reported pain with a deep breath and chest discomfort.  When further probed on chest discomfort she  does points to her mid epigastric and describes a burning sensation that fans out and is uncomfortable to take a deep breath.  She reports clear to creamy nasal discharge, sinus pressure.  Denies fevers or chills.  On presentation to the ER on 1/10, she had a new oxygen requirement and mildly elevated D-dimer prompting CTA of the chest which was negative for PE but showed mild areas of groundglass in both lungs, prominent hilar nodes, 1 isolated subpleural apical nodule measuring 5 mm.  In addition CT abdomen pelvis was completed which was largely negative.  Initial labs notable for albumin 3.1, total protein 6.2, LDH 333, negative troponin and BNP, WBC 17.4, initial hemoglobin 9.2 down to 7.9, MCV 103, and platelets 32.  Reticulocyte count 15.3, RBC 2.35.  Peripheral smear with decreased platelets but RBC morphology unremarkable.  PCCM consulted for evaluation of oxygen requirements.  Pertinent  Medical History  GERD-prior upper GI with esophageal erosions per Dr. Oletta Lamas Colon polyps Hemorrhoids Paroxysmal tachycardia-followed Dr. Caryl Comes Asthma Seasonal allergies Former tobacco abuse Current vaping Anxiety Abdominal hysterectomy  Significant Hospital Events: Including procedures, antibiotic start and stop dates in addition to other pertinent events   1/10 Admit 1/11 PCCM consulted   Interim History / Subjective:  As above   Objective   Blood pressure (!) 128/59, pulse 92, temperature 98.3 F (36.8 C), temperature source Oral, resp. rate 16,  height '5\' 3"'$  (1.6 m), weight 98.4 kg, SpO2 95 %.        Intake/Output Summary (Last 24 hours) at 01/30/2022 1410 Last data filed at 01/30/2022 0331 Gross per 24 hour  Intake 590 ml  Output --  Net 590 ml   Filed Weights   01/30/22 0315  Weight: 98.4 kg    Examination: General: chronically ill appearing adult female lying in bed in NAD HENT: MM pink/moist, pale, anicteric, upper dental implants noted Lungs: non-labored at rest, on 2L,  lungs bilaterally diminished but clear, no crackles or wheezes  Cardiovascular: S1S2 RRR, no m/r/g Abdomen: soft/non-tender, bsx4 active, tolerating PO's  Extremities: warm/dry, no edema, pale  Neuro: AAOx4, speech clear, MAE / non-focal     Resolved Hospital Problem list     Assessment & Plan:   Acute Hypoxemic Respiratory Failure  Asthma  47m Subpleural Pulmonary Nodule Tobacco Abuse Hx Current Vaping  In setting of prior prolonged tobacco abuse, suspected shunt due to inactivity / prolonged sedentary lifestyle and obesity, anemia and +/- sleep disordered breathing. No significant consolidation on radiographic review - possible air trapping, vs recent viral illness or vaping hx in terms of appearance seen on CT.  -add incentive spirometry, suspect her O2 need will disappear with use  -mobilize / OOB  -wean O2 off for sats >90% -follow intermittent CXR  -consider outpatient sleep evaluation with PSG -follow up pulmonary nodule in 6 months to one year as outpatient  -PRN albuterol, pt not taking home advair but add brovana + pulmicort while inpatient   Macrocytic Anemia Thrombocytopenia  Suspect multifactorial anemia due to poor nutritional intake. FOBT negative. Up to date on cancer screenings. Thrombocytopenia most likely side effect of Augmentin use.  Pepcid can also have ~3% associated risk of thrombocytopenia.  -trend CBC -add MVI, B12 (does not drink alcohol) -hold further Augmentin  -peripheral smear pending   Moderate Protein Calorie Malnutrition (POA) Low Protein, Albumin Due to poor intake with dental pain  -diet as tolerated -add ensure TID  -assess UA, ? If she will have ketones to support   Possible Sinusitis Recent Dental Work  S/p prolonged course of Augmentin  -reasonable to consider CT Maxillofacial to ensure no developing abscess as source of thrombocytopenia given recent extensive dental work     Educated patient on diarrhea, risks of C-diff.  She  indicates understanding to inform staff if she were to develop diarrhea/fever.    Best Practice (right click and "Reselect all SmartList Selections" daily)  Per Primary SVC   Labs   CBC: Recent Labs  Lab 01/29/22 1345 01/30/22 0205  WBC 17.4* 15.4*  NEUTROABS  --  6.2  HGB 9.2* 7.9*  HCT 26.7* 23.7*  MCV 100.0 103.0*  PLT 43* 32*    Basic Metabolic Panel: Recent Labs  Lab 01/29/22 1345 01/30/22 0205  NA 136 138  K 3.9 3.7  CL 98 100  CO2 30 27  GLUCOSE 105* 92  BUN 6 9  CREATININE 0.67 0.79  CALCIUM 8.6* 8.4*   GFR: Estimated Creatinine Clearance: 86.7 mL/min (by C-G formula based on SCr of 0.79 mg/dL). Recent Labs  Lab 01/29/22 1345 01/29/22 1838 01/29/22 2010 01/30/22 0205  WBC 17.4*  --   --  15.4*  LATICACIDVEN  --  1.5 1.1  --     Liver Function Tests: Recent Labs  Lab 01/29/22 1349 01/30/22 0205  AST 30 24  ALT 21 16  ALKPHOS 73 74  BILITOT 0.6 0.7  PROT 7.3 6.2*  ALBUMIN 3.3* 3.1*   Recent Labs  Lab 01/29/22 1349  LIPASE 37   No results for input(s): "AMMONIA" in the last 168 hours.  ABG No results found for: "PHART", "PCO2ART", "PO2ART", "HCO3", "TCO2", "ACIDBASEDEF", "O2SAT"   Coagulation Profile: Recent Labs  Lab 01/30/22 0205  INR 1.2    Cardiac Enzymes: No results for input(s): "CKTOTAL", "CKMB", "CKMBINDEX", "TROPONINI" in the last 168 hours.  HbA1C: Hemoglobin A1C  Date/Time Value Ref Range Status  11/25/2019 12:00 AM 6.5  Final  10/30/2016 12:00 AM 5.5  Final   Hgb A1c MFr Bld  Date/Time Value Ref Range Status  06/11/2021 10:07 AM 6.4 4.6 - 6.5 % Final    Comment:    Glycemic Control Guidelines for People with Diabetes:Non Diabetic:  <6%Goal of Therapy: <7%Additional Action Suggested:  >8%   12/11/2020 10:36 AM 6.5 4.6 - 6.5 % Final    Comment:    Glycemic Control Guidelines for People with Diabetes:Non Diabetic:  <6%Goal of Therapy: <7%Additional Action Suggested:  >8%     CBG: No results for input(s):  "GLUCAP" in the last 168 hours.  Review of Systems:   Gen: Denies fever, chills, weight change, fatigue, night sweats HEENT: Denies blurred vision, double vision, hearing loss, tinnitus, sinus congestion, rhinorrhea, sore throat, neck stiffness, dysphagia PULM: Denies shortness of breath, cough, sputum production, hemoptysis, wheezing CV: Denies chest pain, edema, orthopnea, paroxysmal nocturnal dyspnea, palpitations GI: Denies epigastric pain with swallowing Augmentin, nausea, vomiting, 2 episodes of diarrhea on 1/11, hematochezia, melena, constipation, change in bowel habits GU: Denies dysuria, hematuria, polyuria, oliguria, urethral discharge Endocrine: Denies hot or cold intolerance, polyuria, polyphagia or appetite change Derm: Denies rash, dry skin, scaling or peeling skin change Heme: Denies easy bruising, bleeding, bleeding gums Neuro: Denies headache, numbness, weakness, slurred speech, loss of memory or consciousness  Past Medical History:  She,  has a past medical history of Allergy, Anxiety, Asthma, GERD (gastroesophageal reflux disease), Hemorrhoids, colonic polyps (2010), Hyperglycemia, Hyperlipidemia, and Tachycardia, paroxysmal ([place]).   Surgical History:   Past Surgical History:  Procedure Laterality Date   ABDOMINAL HYSTERECTOMY  2004   For Abnormal Pap & painful menses, Dr Deatra Ina   COLONOSCOPY W/ POLYPECTOMY  11/2008   Dr Oletta Lamas   ESI     X 2; Dr Nelva Bush   UPPER GI ENDOSCOPY  04/2012   esophageal erosions ; Dr Skip Mayer TOOTH EXTRACTION  02/13/2009     Social History:   reports that she quit smoking about 10 years ago. Her smoking use included cigarettes. She has never used smokeless tobacco. She reports current alcohol use. She reports that she does not use drugs.   Family History:  Her family history includes Bipolar disorder in her brother; Breast cancer in an other family member; Colon cancer in her father, paternal grandfather, and paternal uncle;  Coronary artery disease in her mother; Depression in her brother; Diabetes in her brother and father; Hyperlipidemia in her mother; Lung cancer in her paternal grandfather. There is no history of Stroke.   Allergies Allergies  Allergen Reactions   Monosodium Glutamate Shortness Of Breath   Cat Hair Extract     Other reaction(s): Eye redness     Home Medications  Prior to Admission medications   Medication Sig Start Date End Date Taking? Authorizing Provider  albuterol (VENTOLIN HFA) 108 (90 Base) MCG/ACT inhaler INHALE 2 PUFFS BY MOUTH EVERY 4 HOURS AS NEEDED FOR WHEEZING Patient taking differently: Inhale 2 puffs  into the lungs every 4 (four) hours as needed. INHALE 2 PUFFS BY MOUTH EVERY 4 HOURS AS NEEDED FOR WHEEZING 11/27/20  Yes Burns, Claudina Lick, MD  amoxicillin-clavulanate (AUGMENTIN) 875-125 MG tablet Take 1 tablet by mouth 2 (two) times daily. 01/27/22  Yes [provider]  atenolol (TENORMIN) 50 MG tablet TAKE 1 TABLET BY MOUTH DAILY 11/20/21  Yes Burns, Claudina Lick, MD  clonazePAM (KLONOPIN) 0.5 MG tablet TAKE 1 TO 1 AND 1/2 TABLETS BY MOUTH EVERY 8 TO 12 HOURS AS NEEDED 04/12/21  Yes Burns, Claudina Lick, MD  famotidine (PEPCID) 20 MG tablet Take 20 mg by mouth daily as needed for heartburn or indigestion.   Yes [provider]  FLUoxetine (PROZAC) 40 MG capsule Take 1 capsule (40 mg total) by mouth daily. 06/11/21  Yes Burns, Claudina Lick, MD  HYDROcodone-acetaminophen (NORCO) 10-325 MG per tablet Take 1 tablet by mouth 5 (five) times daily.    Yes [provider]  ibuprofen (ADVIL) 200 MG tablet Take 200 mg by mouth every 6 (six) hours as needed for mild pain.   Yes [provider]  Menthol-Methyl Salicylate (SALONPAS PAIN RELIEF PATCH EX) Apply 1 patch topically as needed (muscle pain in neck).   Yes [provider]  rosuvastatin (CRESTOR) 5 MG tablet TAKE 1 TABLET BY MOUTH DAILY Patient taking differently: Take 5 mg by mouth daily. TAKE 1 TABLET BY MOUTH  DAILY. 11/20/21  Yes Burns, Claudina Lick, MD  zolpidem (AMBIEN) 5 MG tablet Take 1 tablet (5 mg total) by mouth at bedtime as needed for sleep. 12/02/19  Yes Burns, Claudina Lick, MD  fluticasone-salmeterol (ADVAIR) 250-50 MCG/ACT AEPB SMARTSIG:1 Puff(s) Via Inhaler Morning-Night Patient not taking: Reported on 01/29/2022 06/11/21   Binnie Rail, MD     Critical care time: n/a     Noe Gens, MSN, APRN, NP-C, AGACNP-BC [facility] 01/30/2022, 2:10 PM   Please see [email or web address] for pager details.   From 7A-7P if no response, please call [phone] After hours, please call ELink [phone]

## 2022-01-31 DIAGNOSIS — C9200 Acute myeloblastic leukemia, not having achieved remission: Secondary | ICD-10-CM | POA: Diagnosis not present

## 2022-01-31 DIAGNOSIS — D630 Anemia in neoplastic disease: Secondary | ICD-10-CM | POA: Diagnosis not present

## 2022-01-31 DIAGNOSIS — C9590 Leukemia, unspecified not having achieved remission: Secondary | ICD-10-CM

## 2022-01-31 DIAGNOSIS — D696 Thrombocytopenia, unspecified: Secondary | ICD-10-CM | POA: Diagnosis not present

## 2022-01-31 DIAGNOSIS — J9601 Acute respiratory failure with hypoxia: Secondary | ICD-10-CM | POA: Diagnosis not present

## 2022-01-31 LAB — CBC
HCT: 23.3 % — ABNORMAL LOW (ref 36.0–46.0)
HCT: 23.4 % — ABNORMAL LOW (ref 36.0–46.0)
Hemoglobin: 7.9 g/dL — ABNORMAL LOW (ref 12.0–15.0)
Hemoglobin: 8.1 g/dL — ABNORMAL LOW (ref 12.0–15.0)
MCH: 34.3 pg — ABNORMAL HIGH (ref 26.0–34.0)
MCH: 35.2 pg — ABNORMAL HIGH (ref 26.0–34.0)
MCHC: 33.9 g/dL (ref 30.0–36.0)
MCHC: 34.6 g/dL (ref 30.0–36.0)
MCV: 101.3 fL — ABNORMAL HIGH (ref 80.0–100.0)
MCV: 101.7 fL — ABNORMAL HIGH (ref 80.0–100.0)
Platelets: 28 10*3/uL — CL (ref 150–400)
Platelets: 30 10*3/uL — ABNORMAL LOW (ref 150–400)
RBC: 2.3 MIL/uL — ABNORMAL LOW (ref 3.87–5.11)
RBC: 2.3 MIL/uL — ABNORMAL LOW (ref 3.87–5.11)
RDW: 15.2 % (ref 11.5–15.5)
RDW: 15.2 % (ref 11.5–15.5)
WBC: 15.3 10*3/uL — ABNORMAL HIGH (ref 4.0–10.5)
WBC: 16.7 10*3/uL — ABNORMAL HIGH (ref 4.0–10.5)
nRBC: 0.5 % — ABNORMAL HIGH (ref 0.0–0.2)
nRBC: 0.7 % — ABNORMAL HIGH (ref 0.0–0.2)

## 2022-01-31 LAB — BASIC METABOLIC PANEL
Anion gap: 10 (ref 5–15)
BUN: 6 mg/dL (ref 6–20)
CO2: 27 mmol/L (ref 22–32)
Calcium: 8.3 mg/dL — ABNORMAL LOW (ref 8.9–10.3)
Chloride: 102 mmol/L (ref 98–111)
Creatinine, Ser: 0.55 mg/dL (ref 0.44–1.00)
GFR, Estimated: >60 mL/min (ref >60)
Glucose, Bld: 93 mg/dL (ref 70–99)
Potassium: 3.2 mmol/L — ABNORMAL LOW (ref 3.5–5.1)
Sodium: 139 mmol/L (ref 135–145)

## 2022-01-31 LAB — HIV ANTIBODY (ROUTINE TESTING W REFLEX): HIV Screen 4th Generation wRfx: NONREACTIVE

## 2022-01-31 LAB — HAPTOGLOBIN: Haptoglobin: 329 mg/dL (ref 33–346)

## 2022-01-31 MED ORDER — CLONAZEPAM 0.5 MG PO TABS
0.5000 mg | ORAL_TABLET | Freq: Once | ORAL | Status: AC
  Administered 2022-01-31: 0.5 mg via ORAL
  Filled 2022-01-31: qty 1

## 2022-01-31 MED ORDER — HYDROCODONE-ACETAMINOPHEN 10-325 MG PO TABS
1.0000 | ORAL_TABLET | Freq: Four times a day (QID) | ORAL | Status: DC
  Administered 2022-01-31 – 2022-02-02 (×10): 1 via ORAL
  Filled 2022-01-31 (×10): qty 1

## 2022-01-31 MED ORDER — POTASSIUM CHLORIDE 20 MEQ PO PACK
40.0000 meq | PACK | Freq: Once | ORAL | Status: AC
  Administered 2022-01-31: 40 meq via ORAL
  Filled 2022-01-31: qty 2

## 2022-01-31 NOTE — Progress Notes (Signed)
Called PAL line at [facility] to initiate patient transfer. They stated they will call me back about bed availability

## 2022-01-31 NOTE — Progress Notes (Cosign Needed Addendum)
Daily Progress Note Intern Pager: [phone]  Patient name: Amanda [name] Medical record number: [id number] Date of birth: 07-14-1965 Age: 57 y.o. 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 female presenting with SOB, pleuritic chest pain, anemia, and thrombocytopenia.    Pertinent PMH/PSH includes asthma, allergic rhinitis, GERD, diet-controlled T2DM, depression.  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  CML or myelofibrosis also listed as DDx, but less likely per oncologist.  Labs ordered to evaluate.  Transfer to [facility] for leukemia treatment is underway.  Currently reports pain, most notably in posterior ribs (anterior previously).  Did not receive morning dose of Norco at time of discussion, will schedule home Norco.  - Transfer to [facility], will call back with room availability - Per oncology, molecular testing for BCR/ABL and JAK2 mutation to evaluate for myeloproliferative disorder, considering bone marrow biopsy - Pain management with Norco (10-325 hydrocodone-acetaminophen) Q6H scheduled and acetaminophen 650 mg Q6H PRN PO or suppository - Transfuse PRBC Hgb<7 and platelets if <10 or actively bleeding  Anemia Hgb 9.2>7.9, unchanged today.  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  No evidence of schistocytes or immature blasts on peripheral smear.  Anemia likely exacerbating dyspnea. - Monitor for active bleeding - Daily CBC (with diff), trend H&H - Transfuse Hgb<7.0  Thrombocytopenia (HCC) Incidentally noted to have thrombocytopenia on admission, now 43>32>28, suspect due to AML.  No bleeding or bruising  noted on exam, patient has not noticed such symptoms either.  No current indication for transfusion.  Heme-onc consulted and onboard. - Daily CBC, trend platelets - Transfuse platelets <10 or if symptomatic  Acute hypoxic respiratory failure ([place]) Patient still having pleuritic chest pain, no dyspnea at rest on 2L O2 by [place].  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  Remains afebrile.  Pulmonology did not find any specific cause of dyspnea yesterday, but likely related to anemia and AML.  Echo unremarkable (#F 60-65%) except benign tricuspid aortic valve. - 2L [place] (no O2 @ baseline) - Continuous pulse ox - Tylenol Q6H PRN, scheduled Norco for pain - AM CBC with diff  FEN/GI: Regular diet, PIV x2 PPx: SCDs, no heparin given thrombocytopenia and anemia Dispo: [facility] for leukemia treatment  Subjective:  This morning, patient is still processing her diagnosis.  Husband is at bedside and is supporting patient as well.  Patient did become tearful when discussing her diagnosis and expresses that it is very difficult to cope.  However, she also reaffirms that she wants to be strong and pursue treatment.  She notes significant discomfort and generalized pain at this time, she had not received her morning Norco.  Notes pleuritic chest pain localizing to ribs and shortness of breath.  Still has mild abdominal pain.  PCP updated on patient developments per pt request.  Objective: Temp:  [97.8 F (36.6 C)-98.6 F (37 C)] 98.1 F (36.7 C) (01/12 0900) Pulse Rate:  [78-83] 82 (01/12 0900) Resp:  [16] 16 (01/12 0900) BP: (118-137)/(45-64) 130/64 (01/12 0900) SpO2:  [96 %-98 %] 98 % (01/12 0900)  Physical Exam: General: Resting in bed, husband at bedside. NAD.  Became tearful when  discussing diagnosis.  Alert and at baseline. HEENT: MMM. Cardiovascular: Regular rate and rhythm. Normal S1/S2. No murmurs, rubs, or gallops appreciated. 2+ radial pulses. Pulmonary: No increased WOB, no accessory muscle usage  on 2L O2 by [place]. No wheezes, rales, or crackles. Abdominal: Normoactive bowel sounds. Mild epigastric TTP. No rebound or guarding. No HSM. Skin: Warm and dry. Extremities: No peripheral edema bilaterally.  Laboratory: Most recent CBC Lab Results  Component Value Date   WBC 15.3 (H) 01/31/2022   HGB 7.9 (L) 01/31/2022   HCT 23.3 (L) 01/31/2022   MCV 101.3 (H) 01/31/2022   PLT 28 (LL) 01/31/2022   Most recent BMP    Latest Ref Rng & Units 01/31/2022    4:02 AM  BMP  Glucose 70 - 99 mg/dL 93   BUN 6 - 20 mg/dL 6   Creatinine 0.44 - 1.00 mg/dL 0.55   Sodium 135 - 145 mmol/L 139   Potassium 3.5 - 5.1 mmol/L 3.2   Chloride 98 - 111 mmol/L 102   CO2 22 - 32 mmol/L 27   Calcium 8.9 - 10.3 mg/dL 8.3    Other pertinent labs: - BCR-ABL1: Pending - JAK2: Pending  Imaging/Diagnostic Tests: - Peripheral blood smear (path review): IMMATURE MONONUCLEAR CELLS.  The morphologic differential diagnosis includes an Acute Leukemia or the leukemic phase of a lymphoproliferative process. No evidence of immature blasts or red cell fragmentation or schistocytes.  Dimitri Shitarev MS4, [employer] SOM, [place] Intern pager: [phone], text pages welcome Secure chat group [place]    I was personally present and re-performed the exam and medical decision making and verified the service and findings are accurately documented in the student's note.  Precious Gilding, DO 01/31/2022 2:42 PM

## 2022-01-31 NOTE — Progress Notes (Signed)
Date and time results received: 01/31/22  0620  Test: Platelets. Critical Value: 28  Name of Provider Notified: Arlyce Dice MD  No new orders received. Rounding MD will follow up.

## 2022-01-31 NOTE — Assessment & Plan Note (Addendum)
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  CML or myelofibrosis also listed as DDx, but less likely per oncologist.  Labs ordered to evaluate.  Transfer to [facility] for leukemia treatment is underway.  Currently reports pain, most notably in posterior ribs (anterior previously).  Did not receive morning dose of Norco at time of discussion, will schedule home Norco.  - Transfer to [facility], will call back with room availability - Per oncology, molecular testing for BCR/ABL and JAK2 mutation to evaluate for myeloproliferative disorder, considering bone marrow biopsy - Pain management with Norco (10-325 hydrocodone-acetaminophen) Q6H scheduled and acetaminophen 650 mg Q6H PRN PO or suppository - Transfuse PRBC Hgb<7 and platelets if <10 or actively bleeding

## 2022-02-01 LAB — BASIC METABOLIC PANEL
Anion gap: 10 (ref 5–15)
BUN: 6 mg/dL (ref 6–20)
CO2: 25 mmol/L (ref 22–32)
Calcium: 8.6 mg/dL — ABNORMAL LOW (ref 8.9–10.3)
Chloride: 103 mmol/L (ref 98–111)
Creatinine, Ser: 0.56 mg/dL (ref 0.44–1.00)
GFR, Estimated: >60 mL/min (ref >60)
Glucose, Bld: 152 mg/dL — ABNORMAL HIGH (ref 70–99)
Potassium: 4.3 mmol/L (ref 3.5–5.1)
Sodium: 138 mmol/L (ref 135–145)

## 2022-02-01 LAB — CBC WITH DIFFERENTIAL/PLATELET
Abs Immature Granulocytes: 0.8 10*3/uL — ABNORMAL HIGH (ref 0.00–0.07)
Basophils Absolute: 0 10*3/uL (ref 0.0–0.1)
Basophils Relative: 0 %
Eosinophils Absolute: 0.2 10*3/uL (ref 0.0–0.5)
Eosinophils Relative: 1 %
HCT: 22.3 % — ABNORMAL LOW (ref 36.0–46.0)
Hemoglobin: 7.9 g/dL — ABNORMAL LOW (ref 12.0–15.0)
Lymphocytes Relative: 38 %
Lymphs Abs: 7.4 10*3/uL — ABNORMAL HIGH (ref 0.7–4.0)
MCH: 34.2 pg — ABNORMAL HIGH (ref 26.0–34.0)
MCHC: 35.4 g/dL (ref 30.0–36.0)
MCV: 96.5 fL (ref 80.0–100.0)
Metamyelocytes Relative: 2 %
Monocytes Absolute: 0.6 10*3/uL (ref 0.1–1.0)
Monocytes Relative: 3 %
Myelocytes: 2 %
Neutro Abs: 4.1 10*3/uL (ref 1.7–7.7)
Neutrophils Relative %: 21 %
Other: 33 %
Platelets: 27 10*3/uL — CL (ref 150–400)
RBC: 2.31 MIL/uL — ABNORMAL LOW (ref 3.87–5.11)
RDW: 15.2 % (ref 11.5–15.5)
WBC: 19.6 10*3/uL — ABNORMAL HIGH (ref 4.0–10.5)
nRBC: 0.6 % — ABNORMAL HIGH (ref 0.0–0.2)
nRBC: 2 /100 WBC — ABNORMAL HIGH

## 2022-02-01 NOTE — Progress Notes (Signed)
Daily Progress Note Intern Pager: [phone]  Patient name: Amanda [name] Medical record number: [id number] Date of birth: 1965/11/08 Age: 57 y.o. 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 Pertinent PMH/PSH includes asthma, allergic rhinitis, GERD, diet-controlled T2DM, depression.  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  Labs ordered to evaluate.  Transfer to [facility] for leukemia treatment is underway.   - Transfer to [facility], will call back with room availability - Per oncology, molecular testing for BCR/ABL and JAK2 mutation to evaluate for myeloproliferative disorder, considering bone marrow biopsy - Pain management with Norco (10-325 hydrocodone-acetaminophen) Q6H scheduled and acetaminophen 650 mg Q6H PRN PO or suppository - Transfuse PRBC Hgb<7 and platelets if <10 or actively bleeding  Acute hypoxic respiratory failure ([place]) Patient no longer complaining of chest pain, and reports increased walking/activity tolerance. Reports getting dyspneic less frequently with shorter time to recover. On 1L O2 by [place].  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  Remains afebrile. Echo unremarkable (EF 60-65%).  Pulmonology did not find any specific cause of dyspnea yesterday, but likely related to AML.  - Wean O2 as tolerated - Continuous pulse ox - Incentive spirometer - Tylenol Q6H PRN, scheduled Norco q6h for pain - AM CBC  Anemia Hgb 9.2 on admission, fell to 7.9, and  is stable today.  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   - Monitor for active bleeding - Daily CBC (with diff), trend H&H - Transfuse Hgb<7.0  Thrombocytopenia (HCC) Incidentally noted to have thrombocytopenia on admission, now 43>32>28>27 today, suspect due to AML.  No bleeding or bruising noted on exam, patient has not noticed such symptoms either.  No current indication for transfusion.  Heme-onc consulted and onboard. - Daily CBC, trend platelets - Transfuse platelets <10 or if symptomatic   FEN/GI: Regular Diet, PIV x 2 PPx: SCD, no heparin given thrombocytopenia Dispo: [place] [facility] service   pending availability .   Subjective:  Patient reports she is doing well today. Notes she has no pain since her meds have been scheduled and that her respiratory status has improved. She can do more before getting dyspneic and has a quicker return to baseline with rest.   Objective: Temp:  [97.9 F (36.6 C)-98.4 F (36.9 C)] 97.9 F (36.6 C) (01/13 0804) Pulse Rate:  [78-90] 78 (01/13 0804) Resp:  [16-20] 16 (01/13 0804) BP: (116-128)/(51-58) 125/58 (01/13 0804) SpO2:  [95 %-98 %] 97 % (01/13 0817) Physical Exam: General: NAD, polite, conversational, hopeful outlook Mood: Normal, not emotionally labile, or sad Cardiovascular: RRR, NRMG Respiratory: CTABL, no wheezing or rhonci Abdomen: Soft, NTTP, non-distended Extremities: Moving all extremities independently, cap refill < 2sec, no edema  Laboratory: Most recent CBC Lab Results  Component Value Date   WBC 19.6 (H) 02/01/2022   HGB 7.9 (L) 02/01/2022   HCT 22.3 (L) 02/01/2022   MCV 96.5 02/01/2022   PLT 27 (LL) 02/01/2022   Most recent BMP    Latest Ref Rng &  Units 02/01/2022    2:27 AM  BMP  Glucose 70 - 99 mg/dL 152   BUN 6 - 20 mg/dL 6   Creatinine 0.44 - 1.00 mg/dL 0.56   Sodium 135 - 145 mmol/L 138   Potassium 3.5 - 5.1 mmol/L 4.3   Chloride 98 - 111 mmol/L 103   CO2 22 - 32 mmol/L 25    Calcium 8.9 - 10.3 mg/dL 8.6      Holley Bouche, MD 02/01/2022, 2:48 PM  PGY-2, [place] Intern pager: [phone], text pages welcome Secure chat group [place]

## 2022-02-01 NOTE — Progress Notes (Signed)
Daily Progress Note Intern Pager: [phone]  Patient name: LIAHNA BRICKNER Medical record number: [id number] Date of birth: 1965/04/29 Age: 57 y.o. 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 Pertinent PMH/PSH includes asthma, allergic rhinitis, GERD, diet-controlled T2DM, depression.   * Leukemia ([place]) Peripheral blood smear most consistent with AML, CML or myelofibrosis also listed as DDx, but less likely per oncologist.  Labs ordered to evaluate.  Transfer to [facility] for leukemia treatment is underway.   - Transfer to [facility], will call back with room availability - Per oncology, molecular testing for BCR/ABL and JAK2 mutation to evaluate for myeloproliferative disorder, considering bone marrow biopsy - Pain management with Norco (10-325 hydrocodone-acetaminophen) Q6H scheduled and acetaminophen 650 mg Q6H PRN PO or suppository - Transfuse PRBC Hgb<7 and platelets if <10 or actively bleeding  Acute hypoxic respiratory failure ([place]) Patient no longer complaining of chest pain, and reports increased walking/activity tolerance. Reports getting dyspneic less frequently with shorter time to recover. On 1L O2 by [place].  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  Remains afebrile. Echo unremarkable (EF 60-65%).  Pulmonology did not find any specific cause of dyspnea yesterday, but likely related to AML.  - Wean O2 as tolerated - Continuous pulse ox - Incentive spirometer - Tylenol Q6H PRN, scheduled Norco q6h for pain - AM CBC  Anemia Hgb 9.2 on admission, fell to 7.9, and  is stable today.  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   - Monitor for active bleeding - Daily CBC (with diff), trend H&H - Transfuse Hgb<7.0  Thrombocytopenia (HCC) Incidentally noted to have thrombocytopenia on admission, now 43>32>28>27 today, suspect due to AML.  No bleeding or bruising noted on exam, patient has not noticed such symptoms either.  No current indication for transfusion.  Heme-onc consulted and onboard. - Daily CBC, trend platelets - Transfuse platelets <10 or if symptomatic  FEN/GI: Regular Diet, PIV x 2 PPx: SCD, no heparin given thrombocytopenia Dispo: [facility] Leukemia on waitlist pending bed placement     Subjective:  ***  Objective: Temp:  [97.9 F (36.6 C)-98.7 F (37.1 C)] 98.7 F (37.1 C) (01/13 1949) Pulse Rate:  [78-87] 86 (01/13 1949) Resp:  [15-20] 15 (01/13 1949) BP: (113-128)/(50-58) 123/50 (01/13 1949) SpO2:  [93 %-98 %] 93 % (01/13 1949) Physical Exam: General: *** Cardiovascular: *** Respiratory: *** Abdomen: *** Extremities: ***  Laboratory: Most recent CBC Lab Results  Component Value Date   WBC 19.6 (H) 02/01/2022   HGB 7.9 (L) 02/01/2022   HCT 22.3 (L) 02/01/2022   MCV 96.5 02/01/2022   PLT 27 (LL) 02/01/2022   Most recent BMP    Latest Ref Rng & Units 02/01/2022    2:27 AM  BMP  Glucose 70 - 99 mg/dL 152   BUN 6 - 20 mg/dL 6   Creatinine 0.44 - 1.00 mg/dL 0.56   Sodium 135 - 145 mmol/L 138   Potassium 3.5 - 5.1 mmol/L 4.3   Chloride 98 - 111 mmol/L 103   CO2 22 - 32 mmol/L 25  Calcium 8.9 - 10.3 mg/dL 8.6     Other pertinent labs ***   Imaging/Diagnostic Tests: Radiologist Impression: *** My interpretation: Gerrit Heck, MD 02/01/2022, 8:15 PM  PGY-2, [facility] Intern pager: [phone], text pages welcome Secure chat group [place]

## 2022-02-02 DIAGNOSIS — I452 Bifascicular block: Secondary | ICD-10-CM | POA: Diagnosis not present

## 2022-02-02 DIAGNOSIS — F419 Anxiety disorder, unspecified: Secondary | ICD-10-CM | POA: Diagnosis not present

## 2022-02-02 DIAGNOSIS — I493 Ventricular premature depolarization: Secondary | ICD-10-CM | POA: Diagnosis not present

## 2022-02-02 DIAGNOSIS — D709 Neutropenia, unspecified: Secondary | ICD-10-CM | POA: Diagnosis not present

## 2022-02-02 DIAGNOSIS — M79602 Pain in left arm: Secondary | ICD-10-CM | POA: Diagnosis not present

## 2022-02-02 DIAGNOSIS — I517 Cardiomegaly: Secondary | ICD-10-CM | POA: Diagnosis not present

## 2022-02-02 DIAGNOSIS — J90 Pleural effusion, not elsewhere classified: Secondary | ICD-10-CM | POA: Diagnosis not present

## 2022-02-02 DIAGNOSIS — J9691 Respiratory failure, unspecified with hypoxia: Secondary | ICD-10-CM | POA: Diagnosis not present

## 2022-02-02 DIAGNOSIS — M7989 Other specified soft tissue disorders: Secondary | ICD-10-CM | POA: Diagnosis not present

## 2022-02-02 DIAGNOSIS — D61818 Other pancytopenia: Secondary | ICD-10-CM | POA: Diagnosis not present

## 2022-02-02 DIAGNOSIS — J45909 Unspecified asthma, uncomplicated: Secondary | ICD-10-CM | POA: Diagnosis not present

## 2022-02-02 DIAGNOSIS — R0902 Hypoxemia: Secondary | ICD-10-CM | POA: Diagnosis not present

## 2022-02-02 DIAGNOSIS — K5900 Constipation, unspecified: Secondary | ICD-10-CM | POA: Diagnosis not present

## 2022-02-02 DIAGNOSIS — D649 Anemia, unspecified: Secondary | ICD-10-CM | POA: Diagnosis not present

## 2022-02-02 DIAGNOSIS — R5081 Fever presenting with conditions classified elsewhere: Secondary | ICD-10-CM | POA: Diagnosis not present

## 2022-02-02 DIAGNOSIS — E785 Hyperlipidemia, unspecified: Secondary | ICD-10-CM | POA: Diagnosis not present

## 2022-02-02 DIAGNOSIS — R9431 Abnormal electrocardiogram [ECG] [EKG]: Secondary | ICD-10-CM | POA: Diagnosis not present

## 2022-02-02 DIAGNOSIS — J9601 Acute respiratory failure with hypoxia: Secondary | ICD-10-CM | POA: Diagnosis not present

## 2022-02-02 DIAGNOSIS — C9300 Acute monoblastic/monocytic leukemia, not having achieved remission: Secondary | ICD-10-CM | POA: Diagnosis not present

## 2022-02-02 DIAGNOSIS — D61810 Antineoplastic chemotherapy induced pancytopenia: Secondary | ICD-10-CM | POA: Diagnosis not present

## 2022-02-02 DIAGNOSIS — R509 Fever, unspecified: Secondary | ICD-10-CM | POA: Diagnosis not present

## 2022-02-02 DIAGNOSIS — E119 Type 2 diabetes mellitus without complications: Secondary | ICD-10-CM | POA: Diagnosis not present

## 2022-02-02 DIAGNOSIS — R040 Epistaxis: Secondary | ICD-10-CM | POA: Diagnosis not present

## 2022-02-02 DIAGNOSIS — I3139 Other pericardial effusion (noninflammatory): Secondary | ICD-10-CM | POA: Diagnosis not present

## 2022-02-02 DIAGNOSIS — D696 Thrombocytopenia, unspecified: Secondary | ICD-10-CM | POA: Diagnosis not present

## 2022-02-02 DIAGNOSIS — D708 Other neutropenia: Secondary | ICD-10-CM | POA: Diagnosis not present

## 2022-02-02 DIAGNOSIS — I444 Left anterior fascicular block: Secondary | ICD-10-CM | POA: Diagnosis not present

## 2022-02-02 DIAGNOSIS — E8770 Fluid overload, unspecified: Secondary | ICD-10-CM | POA: Diagnosis not present

## 2022-02-02 DIAGNOSIS — J189 Pneumonia, unspecified organism: Secondary | ICD-10-CM | POA: Diagnosis not present

## 2022-02-02 DIAGNOSIS — I4510 Unspecified right bundle-branch block: Secondary | ICD-10-CM | POA: Diagnosis not present

## 2022-02-02 DIAGNOSIS — I10 Essential (primary) hypertension: Secondary | ICD-10-CM | POA: Diagnosis not present

## 2022-02-02 DIAGNOSIS — Z5111 Encounter for antineoplastic chemotherapy: Secondary | ICD-10-CM | POA: Diagnosis not present

## 2022-02-02 DIAGNOSIS — D630 Anemia in neoplastic disease: Secondary | ICD-10-CM | POA: Diagnosis not present

## 2022-02-02 DIAGNOSIS — A419 Sepsis, unspecified organism: Secondary | ICD-10-CM | POA: Diagnosis not present

## 2022-02-02 DIAGNOSIS — I472 Ventricular tachycardia, unspecified: Secondary | ICD-10-CM | POA: Diagnosis not present

## 2022-02-02 DIAGNOSIS — E222 Syndrome of inappropriate secretion of antidiuretic hormone: Secondary | ICD-10-CM | POA: Diagnosis not present

## 2022-02-02 DIAGNOSIS — C9200 Acute myeloblastic leukemia, not having achieved remission: Secondary | ICD-10-CM | POA: Diagnosis not present

## 2022-02-02 DIAGNOSIS — K219 Gastro-esophageal reflux disease without esophagitis: Secondary | ICD-10-CM | POA: Diagnosis not present

## 2022-02-02 DIAGNOSIS — E0781 Sick-euthyroid syndrome: Secondary | ICD-10-CM | POA: Diagnosis not present

## 2022-02-02 DIAGNOSIS — R0602 Shortness of breath: Secondary | ICD-10-CM | POA: Diagnosis not present

## 2022-02-02 LAB — CBC WITH DIFFERENTIAL/PLATELET
Abs Immature Granulocytes: 3.3 10*3/uL — ABNORMAL HIGH (ref 0.00–0.07)
Band Neutrophils: 5 %
Basophils Absolute: 0 10*3/uL (ref 0.0–0.1)
Basophils Relative: 0 %
Blasts: 33 %
Eosinophils Absolute: 0 10*3/uL (ref 0.0–0.5)
Eosinophils Relative: 0 %
HCT: 22.9 % — ABNORMAL LOW (ref 36.0–46.0)
Hemoglobin: 7.8 g/dL — ABNORMAL LOW (ref 12.0–15.0)
Lymphocytes Relative: 29 %
Lymphs Abs: 7.3 10*3/uL — ABNORMAL HIGH (ref 0.7–4.0)
MCH: 34.2 pg — ABNORMAL HIGH (ref 26.0–34.0)
MCHC: 34.1 g/dL (ref 30.0–36.0)
MCV: 100.4 fL — ABNORMAL HIGH (ref 80.0–100.0)
Metamyelocytes Relative: 2 %
Monocytes Absolute: 0.5 10*3/uL (ref 0.1–1.0)
Monocytes Relative: 2 %
Myelocytes: 2 %
Neutro Abs: 5.8 10*3/uL (ref 1.7–7.7)
Neutrophils Relative %: 18 %
Platelets: 28 10*3/uL — CL (ref 150–400)
Promyelocytes Relative: 9 %
RBC: 2.28 MIL/uL — ABNORMAL LOW (ref 3.87–5.11)
RDW: 15.4 % (ref 11.5–15.5)
Smear Review: DECREASED
WBC: 25.3 10*3/uL — ABNORMAL HIGH (ref 4.0–10.5)
nRBC: 0.6 % — ABNORMAL HIGH (ref 0.0–0.2)
nRBC: 1 /100 WBC — ABNORMAL HIGH

## 2022-02-02 LAB — BASIC METABOLIC PANEL
Anion gap: 10 (ref 5–15)
BUN: 8 mg/dL (ref 6–20)
CO2: 29 mmol/L (ref 22–32)
Calcium: 9 mg/dL (ref 8.9–10.3)
Chloride: 99 mmol/L (ref 98–111)
Creatinine, Ser: 0.68 mg/dL (ref 0.44–1.00)
GFR, Estimated: >60 mL/min (ref >60)
Glucose, Bld: 142 mg/dL — ABNORMAL HIGH (ref 70–99)
Potassium: 3.8 mmol/L (ref 3.5–5.1)
Sodium: 138 mmol/L (ref 135–145)

## 2022-02-02 MED ORDER — CLONAZEPAM 0.5 MG PO TABS
0.5000 mg | ORAL_TABLET | Freq: Three times a day (TID) | ORAL | Status: DC | PRN
  Administered 2022-02-02: 0.5 mg via ORAL
  Filled 2022-02-02: qty 1

## 2022-02-02 MED ORDER — ACETAMINOPHEN 325 MG PO TABS: 650.0000 mg | ORAL_TABLET | Freq: Four times a day (QID) | ORAL | Status: DC | PRN

## 2022-02-02 MED ORDER — CYANOCOBALAMIN 1000 MCG PO TABS: 1000.0000 ug | ORAL_TABLET | Freq: Every day | ORAL | Status: AC

## 2022-02-02 MED ORDER — ARFORMOTEROL TARTRATE 15 MCG/2ML IN NEBU: 15.0000 ug | INHALATION_SOLUTION | Freq: Two times a day (BID) | RESPIRATORY_TRACT | Status: DC

## 2022-02-02 MED ORDER — BUDESONIDE 0.5 MG/2ML IN SUSP: 0.5000 mg | Freq: Two times a day (BID) | RESPIRATORY_TRACT | 12 refills | Status: DC

## 2022-02-02 NOTE — Discharge Summary (Cosign Needed Addendum)
[facility] Discharge/Transfer Summary  Patient name: Amanda [name] Medical record number: [id number] Date of birth: 07-May-1965 Age: 57 y.o. Gender: female Date of Admission: 01/29/2022  Date of Transfer: 02/02/2022 Admitting Physician: Lind Covert, MD  Primary Care Provider: Binnie Rail, MD Consultants: Heme-Onc  Indication for Hospitalization: Acute hypoxemic respiratory failure and thrombocytopenia  Discharge Diagnoses/Problem List: Acute myeloid leukemia  Principal Problem for Admission: Acute hypoxic respiratory failure and thrombocytopenia   Brief Hospital Course:  ETHELYN [name] is a 57 y.o. 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 Pertinent PMH/PSH includes asthma, allergic rhinitis, GERD, diet-controlled T2DM, depression.     Acute Myeloid Leukemia  Patient had thrombocytopenia with platelets of 43 in ED, Hgb 9.2, LDH 333, D-dimer 1.75. CT abdomen pelvis showed some areas of atrophy and fatty infiltration of pancreas and fatty liver and a splenorenal shunt. Peripheral blood smear obtained revealed immature mononuclear cells. Heme-onc consulted.  Flow cytometry showed 20% CD34 positive blasts consistent with acute myeloid leukemia. Oncology also ordered molecular testing for BCR/ABL and JAK2 mutation which are pending.  Oncologist recommended transfer to [facility] for further testing and treatment for AML.  Hemoglobin and platelets monitored, hemoglobin downtrending to 7.8 and platelets to 28 on day of transfer. Patient did not require any blood transfusions or platelet transfusions during admission. Vital signs remained stable.   Acute Hypoxemic Respiratory Failure  Pleuritic chest pain Patient initially presented due to pleuritic chest pain and shortness of breath, requiring 2 L oxygen via nasal cannula d/t desaturations into the upper 80s.  CXR showed mild bibasilar interstitial  markings, CTA ruled out PE and showed scattered mild areas of groundglass changes in bilateral lungs and a few prominent hilar nodes.  ED initiated ceftriaxone and azithromycin to treat CAP but this was discontinued after admission as pt presentation was not consistent with PNA.  Pulmonary was consulted and recommended incentive spirometry, adding Brovana/Pulmicort while admitted, B12 supplementation. Pleuritic chest pain continued and was thought possibly be related to AML.  Patient was continued on home Norco every 6 hours scheduled.  Patient was able to wean to room air without desaturations but utilized 2 L O2 [place] intermittently for comfort.   Other chronic conditions were medically managed with home medications and formulary alternatives as necessary (anxiety, HLD, GERD, paroxysmal tachycardia, asthma, DDD)   Disposition: transfer to [facility]   Discharge Condition: stable   Discharge Exam:  Vitals:   02/02/22 1621 02/02/22 1948  BP: 122/61 (!) 130/59  Pulse: 86 90  Resp: 16 18  Temp: 98.6 F (37 C) 98.6 F (37 C)  SpO2: 94% 96%   Physical exam per Dr. Jinny Sanders: General: NAD, laying in bed, alert and responsive to questions Cardiovascular: RRR no m/r/g Respiratory: CTAB no w/r/c Abdomen: Soft, nontender to palpation Extremities: No LE edema  Significant Labs and Imaging:  Recent Labs  Lab 02/01/22 0234 02/02/22 0249  WBC 19.6* 25.3*  HGB 7.9* 7.8*  HCT 22.3* 22.9*  PLT 27* 28*   Recent Labs  Lab 02/01/22 0227 02/02/22 0249  NA 138 138  K 4.3 3.8  CL 103 99  CO2 25 29  GLUCOSE 152* 142*  BUN 6 8  CREATININE 0.56 0.68  CALCIUM 8.6* 9.0     Results/Tests Pending at Time of Discharge: molecular testing for BCR/ABL and JAK2 mutation   Discharge Medications:  Allergies as of 02/02/2022       Reactions  Monosodium Glutamate Shortness Of Breath   Cat Hair Extract    Other reaction(s): Eye redness        Medication List     STOP taking these  medications    amoxicillin-clavulanate 875-125 MG tablet Commonly known as: AUGMENTIN   fluticasone-salmeterol 250-50 MCG/ACT Aepb Commonly known as: ADVAIR   ibuprofen 200 MG tablet Commonly known as: ADVIL   zolpidem 5 MG tablet Commonly known as: AMBIEN       TAKE these medications    acetaminophen 325 MG tablet Commonly known as: TYLENOL Take 2 tablets (650 mg total) by mouth every 6 (six) hours as needed for mild pain (or Fever >/= 101).   albuterol 108 (90 Base) MCG/ACT inhaler Commonly known as: VENTOLIN HFA INHALE 2 PUFFS BY MOUTH EVERY 4 HOURS AS NEEDED FOR WHEEZING What changed: See the new instructions.   arformoterol 15 MCG/2ML Nebu Commonly known as: BROVANA Take 2 mLs (15 mcg total) by nebulization 2 (two) times daily.   atenolol 50 MG tablet Commonly known as: TENORMIN TAKE 1 TABLET BY MOUTH DAILY   budesonide 0.5 MG/2ML nebulizer solution Commonly known as: PULMICORT Take 2 mLs (0.5 mg total) by nebulization 2 (two) times daily.   clonazePAM 0.5 MG tablet Commonly known as: KLONOPIN TAKE 1 TO 1 AND 1/2 TABLETS BY MOUTH EVERY 8 TO 12 HOURS AS NEEDED   cyanocobalamin 1000 MCG tablet Take 1 tablet (1,000 mcg total) by mouth daily. Start taking on: February 03, 2022   famotidine 20 MG tablet Commonly known as: PEPCID Take 20 mg by mouth daily as needed for heartburn or indigestion.   FLUoxetine 40 MG capsule Commonly known as: PROZAC Take 1 capsule (40 mg total) by mouth daily.   HYDROcodone-acetaminophen 10-325 MG tablet Commonly known as: NORCO Take 1 tablet by mouth 5 (five) times daily.   rosuvastatin 5 MG tablet Commonly known as: CRESTOR TAKE 1 TABLET BY MOUTH DAILY What changed: additional instructions   SALONPAS PAIN RELIEF PATCH EX Apply 1 patch topically as needed (muscle pain in neck).         Zola Button, MD 02/02/2022, 7:54 PM PGY-3, [place]

## 2022-02-02 NOTE — Progress Notes (Signed)
Amanda [name]

## 2022-02-04 LAB — CULTURE, BLOOD (ROUTINE X 2)
Culture: NO GROWTH
Culture: NO GROWTH
Special Requests: ADEQUATE
Special Requests: ADEQUATE

## 2022-02-06 LAB — JAK2 GENOTYPR

## 2022-02-11 LAB — BCR-ABL1, CML/ALL, PCR, QUANT: Interpretation (BCRAL):: NEGATIVE

## 2022-02-18 ENCOUNTER — Other Ambulatory Visit: Payer: Self-pay | Admitting: Internal Medicine

## 2022-03-03 DIAGNOSIS — C9200 Acute myeloblastic leukemia, not having achieved remission: Secondary | ICD-10-CM | POA: Diagnosis not present

## 2022-03-04 ENCOUNTER — Encounter: Payer: Self-pay | Admitting: *Deleted

## 2022-03-04 ENCOUNTER — Telehealth: Payer: Self-pay | Admitting: *Deleted

## 2022-03-04 DIAGNOSIS — C9200 Acute myeloblastic leukemia, not having achieved remission: Secondary | ICD-10-CM | POA: Diagnosis not present

## 2022-03-04 NOTE — Transitions of Care (Post Inpatient/ED Visit) (Signed)
   03/04/2022  Name: Amanda [name] MRN: [id number] DOB: May 12, 1965  Today's TOC FU Call Status: Today's TOC FU Call Status:: Unsuccessul Call (1st Attempt) Unsuccessful Call (1st Attempt) Date: 03/04/22  Attempted to reach the patient regarding the most recent Inpatient/ED visit.  Follow Up Plan: Additional outreach attempts will be made to reach the patient to complete the Transitions of Care (Post Inpatient visit) call. Left voice message requesting call back  Oneta Rack, RN, BSN, CCRN Alumnus RN CM Care Coordination/ Transition of [place] Management [phone]: direct office

## 2022-03-05 ENCOUNTER — Telehealth: Payer: Self-pay | Admitting: *Deleted

## 2022-03-05 ENCOUNTER — Encounter: Payer: Self-pay | Admitting: *Deleted

## 2022-03-05 DIAGNOSIS — C9200 Acute myeloblastic leukemia, not having achieved remission: Secondary | ICD-10-CM | POA: Diagnosis not present

## 2022-03-05 NOTE — Transitions of Care (Post Inpatient/ED Visit) (Signed)
   03/05/2022  Name: Amanda [name] MRN: [id number] DOB: 1965/09/25  Today's TOC FU Call Status: Today's TOC FU Call Status:: Unsuccessful Call (2nd Attempt) Unsuccessful Call (2nd Attempt) Date: 03/05/22  Attempted to reach the patient regarding the most recent Inpatient/ED visit. Left voice message requesting call back  Follow Up Plan: Additional outreach attempts will be made to reach the patient to complete the Transitions of Care (Post Inpatient/ED visit) call.   Oneta Rack, RN, BSN, CCRN Alumnus RN CM Care Coordination/ Transition of [place] Management [phone]: direct office

## 2022-03-06 ENCOUNTER — Telehealth: Payer: Self-pay | Admitting: *Deleted

## 2022-03-06 ENCOUNTER — Encounter: Payer: Self-pay | Admitting: *Deleted

## 2022-03-06 DIAGNOSIS — C9200 Acute myeloblastic leukemia, not having achieved remission: Secondary | ICD-10-CM | POA: Diagnosis not present

## 2022-03-06 NOTE — Transitions of Care (Post Inpatient/ED Visit) (Signed)
   03/06/2022  Name: Amanda [name] MRN: [id number] DOB: [birth year]  Today's TOC FU Call Status: Today's TOC FU Call Status:: Unsuccessful Call (3rd Attempt) Unsuccessful Call (3rd Attempt) Date: 03/06/22  Attempted to reach the patient regarding the most recent Inpatient/ED visit.  Follow Up Plan: No further outreach attempts will be made at this time. We have been unable to contact the patient.  Left voice message requesting call back  Oneta Rack, RN, BSN, CCRN Alumnus RN CM Care Coordination/ Transition of [place] Management [phone]: direct office

## 2022-03-07 DIAGNOSIS — C9200 Acute myeloblastic leukemia, not having achieved remission: Secondary | ICD-10-CM | POA: Diagnosis not present

## 2022-03-08 DIAGNOSIS — C9200 Acute myeloblastic leukemia, not having achieved remission: Secondary | ICD-10-CM | POA: Diagnosis not present

## 2022-03-09 DIAGNOSIS — C9200 Acute myeloblastic leukemia, not having achieved remission: Secondary | ICD-10-CM | POA: Diagnosis not present

## 2022-03-11 DIAGNOSIS — Z452 Encounter for adjustment and management of vascular access device: Secondary | ICD-10-CM | POA: Diagnosis not present

## 2022-03-11 DIAGNOSIS — C9200 Acute myeloblastic leukemia, not having achieved remission: Secondary | ICD-10-CM | POA: Diagnosis not present

## 2022-03-13 DIAGNOSIS — E785 Hyperlipidemia, unspecified: Secondary | ICD-10-CM | POA: Diagnosis not present

## 2022-03-13 DIAGNOSIS — G8929 Other chronic pain: Secondary | ICD-10-CM | POA: Diagnosis not present

## 2022-03-13 DIAGNOSIS — Z8639 Personal history of other endocrine, nutritional and metabolic disease: Secondary | ICD-10-CM | POA: Diagnosis not present

## 2022-03-13 DIAGNOSIS — R0789 Other chest pain: Secondary | ICD-10-CM | POA: Diagnosis not present

## 2022-03-13 DIAGNOSIS — F419 Anxiety disorder, unspecified: Secondary | ICD-10-CM | POA: Diagnosis not present

## 2022-03-13 DIAGNOSIS — C9200 Acute myeloblastic leukemia, not having achieved remission: Secondary | ICD-10-CM | POA: Diagnosis not present

## 2022-03-13 DIAGNOSIS — J9601 Acute respiratory failure with hypoxia: Secondary | ICD-10-CM | POA: Diagnosis not present

## 2022-03-13 DIAGNOSIS — Z8719 Personal history of other diseases of the digestive system: Secondary | ICD-10-CM | POA: Diagnosis not present

## 2022-03-13 DIAGNOSIS — Z8679 Personal history of other diseases of the circulatory system: Secondary | ICD-10-CM | POA: Diagnosis not present

## 2022-03-13 DIAGNOSIS — M542 Cervicalgia: Secondary | ICD-10-CM | POA: Diagnosis not present

## 2022-03-27 DIAGNOSIS — L709 Acne, unspecified: Secondary | ICD-10-CM | POA: Diagnosis not present

## 2022-03-27 DIAGNOSIS — Z87891 Personal history of nicotine dependence: Secondary | ICD-10-CM | POA: Diagnosis not present

## 2022-03-27 DIAGNOSIS — R9431 Abnormal electrocardiogram [ECG] [EKG]: Secondary | ICD-10-CM | POA: Diagnosis not present

## 2022-03-27 DIAGNOSIS — C9200 Acute myeloblastic leukemia, not having achieved remission: Secondary | ICD-10-CM | POA: Diagnosis not present

## 2022-03-27 DIAGNOSIS — C9591 Leukemia, unspecified, in remission: Secondary | ICD-10-CM | POA: Diagnosis not present

## 2022-03-27 DIAGNOSIS — I452 Bifascicular block: Secondary | ICD-10-CM | POA: Diagnosis not present

## 2022-03-27 DIAGNOSIS — E119 Type 2 diabetes mellitus without complications: Secondary | ICD-10-CM | POA: Diagnosis not present

## 2022-03-27 DIAGNOSIS — K219 Gastro-esophageal reflux disease without esophagitis: Secondary | ICD-10-CM | POA: Diagnosis not present

## 2022-03-27 DIAGNOSIS — G8929 Other chronic pain: Secondary | ICD-10-CM | POA: Diagnosis not present

## 2022-03-27 DIAGNOSIS — E785 Hyperlipidemia, unspecified: Secondary | ICD-10-CM | POA: Diagnosis not present

## 2022-03-27 DIAGNOSIS — K029 Dental caries, unspecified: Secondary | ICD-10-CM | POA: Diagnosis not present

## 2022-03-27 DIAGNOSIS — Z5111 Encounter for antineoplastic chemotherapy: Secondary | ICD-10-CM | POA: Diagnosis not present

## 2022-03-27 DIAGNOSIS — C9300 Acute monoblastic/monocytic leukemia, not having achieved remission: Secondary | ICD-10-CM | POA: Diagnosis not present

## 2022-03-27 DIAGNOSIS — I10 Essential (primary) hypertension: Secondary | ICD-10-CM | POA: Diagnosis not present

## 2022-03-27 DIAGNOSIS — I4510 Unspecified right bundle-branch block: Secondary | ICD-10-CM | POA: Diagnosis not present

## 2022-03-27 DIAGNOSIS — R197 Diarrhea, unspecified: Secondary | ICD-10-CM | POA: Diagnosis not present

## 2022-03-27 DIAGNOSIS — D649 Anemia, unspecified: Secondary | ICD-10-CM | POA: Diagnosis not present

## 2022-03-27 DIAGNOSIS — F32A Depression, unspecified: Secondary | ICD-10-CM | POA: Diagnosis not present

## 2022-03-27 DIAGNOSIS — I444 Left anterior fascicular block: Secondary | ICD-10-CM | POA: Diagnosis not present

## 2022-03-27 DIAGNOSIS — J45909 Unspecified asthma, uncomplicated: Secondary | ICD-10-CM | POA: Diagnosis not present

## 2022-03-27 DIAGNOSIS — F419 Anxiety disorder, unspecified: Secondary | ICD-10-CM | POA: Diagnosis not present

## 2022-03-28 DIAGNOSIS — I4510 Unspecified right bundle-branch block: Secondary | ICD-10-CM | POA: Diagnosis not present

## 2022-03-28 DIAGNOSIS — I452 Bifascicular block: Secondary | ICD-10-CM | POA: Diagnosis not present

## 2022-03-28 DIAGNOSIS — I444 Left anterior fascicular block: Secondary | ICD-10-CM | POA: Diagnosis not present

## 2022-03-31 DIAGNOSIS — I444 Left anterior fascicular block: Secondary | ICD-10-CM | POA: Diagnosis not present

## 2022-03-31 DIAGNOSIS — I452 Bifascicular block: Secondary | ICD-10-CM | POA: Diagnosis not present

## 2022-03-31 DIAGNOSIS — I4510 Unspecified right bundle-branch block: Secondary | ICD-10-CM | POA: Diagnosis not present

## 2022-03-31 DIAGNOSIS — C9201 Acute myeloblastic leukemia, in remission: Secondary | ICD-10-CM | POA: Diagnosis not present

## 2022-04-01 ENCOUNTER — Encounter: Payer: Self-pay | Admitting: Internal Medicine

## 2022-04-03 DIAGNOSIS — C9201 Acute myeloblastic leukemia, in remission: Secondary | ICD-10-CM | POA: Diagnosis not present

## 2022-04-07 DIAGNOSIS — C9201 Acute myeloblastic leukemia, in remission: Secondary | ICD-10-CM | POA: Diagnosis not present

## 2022-04-08 DIAGNOSIS — C9201 Acute myeloblastic leukemia, in remission: Secondary | ICD-10-CM | POA: Diagnosis not present

## 2022-04-08 DIAGNOSIS — I4510 Unspecified right bundle-branch block: Secondary | ICD-10-CM | POA: Diagnosis not present

## 2022-04-09 DIAGNOSIS — C9201 Acute myeloblastic leukemia, in remission: Secondary | ICD-10-CM | POA: Diagnosis not present

## 2022-04-11 DIAGNOSIS — C9201 Acute myeloblastic leukemia, in remission: Secondary | ICD-10-CM | POA: Diagnosis not present

## 2022-04-13 DIAGNOSIS — Z01818 Encounter for other preprocedural examination: Secondary | ICD-10-CM | POA: Diagnosis not present

## 2022-04-14 DIAGNOSIS — C9201 Acute myeloblastic leukemia, in remission: Secondary | ICD-10-CM | POA: Diagnosis not present

## 2022-04-15 DIAGNOSIS — Z01818 Encounter for other preprocedural examination: Secondary | ICD-10-CM | POA: Diagnosis not present

## 2022-04-15 DIAGNOSIS — C9201 Acute myeloblastic leukemia, in remission: Secondary | ICD-10-CM | POA: Diagnosis not present

## 2022-04-15 DIAGNOSIS — I4510 Unspecified right bundle-branch block: Secondary | ICD-10-CM | POA: Diagnosis not present

## 2022-04-17 DIAGNOSIS — Z7689 Persons encountering health services in other specified circumstances: Secondary | ICD-10-CM | POA: Diagnosis not present

## 2022-04-17 DIAGNOSIS — C9201 Acute myeloblastic leukemia, in remission: Secondary | ICD-10-CM | POA: Diagnosis not present

## 2022-04-17 DIAGNOSIS — Z01818 Encounter for other preprocedural examination: Secondary | ICD-10-CM | POA: Diagnosis not present

## 2022-04-18 DIAGNOSIS — R0609 Other forms of dyspnea: Secondary | ICD-10-CM | POA: Diagnosis not present

## 2022-04-21 DIAGNOSIS — R109 Unspecified abdominal pain: Secondary | ICD-10-CM | POA: Diagnosis not present

## 2022-04-21 DIAGNOSIS — Z01818 Encounter for other preprocedural examination: Secondary | ICD-10-CM | POA: Diagnosis not present

## 2022-04-21 DIAGNOSIS — C9201 Acute myeloblastic leukemia, in remission: Secondary | ICD-10-CM | POA: Diagnosis not present

## 2022-04-21 DIAGNOSIS — C9200 Acute myeloblastic leukemia, not having achieved remission: Secondary | ICD-10-CM | POA: Diagnosis not present

## 2022-04-28 DIAGNOSIS — C9201 Acute myeloblastic leukemia, in remission: Secondary | ICD-10-CM | POA: Diagnosis not present

## 2022-04-28 DIAGNOSIS — R109 Unspecified abdominal pain: Secondary | ICD-10-CM | POA: Diagnosis not present

## 2022-04-28 DIAGNOSIS — Z01818 Encounter for other preprocedural examination: Secondary | ICD-10-CM | POA: Diagnosis not present

## 2022-04-28 DIAGNOSIS — C9200 Acute myeloblastic leukemia, not having achieved remission: Secondary | ICD-10-CM | POA: Diagnosis not present

## 2022-05-05 ENCOUNTER — Other Ambulatory Visit: Payer: Self-pay | Admitting: Internal Medicine

## 2022-05-05 DIAGNOSIS — M5136 Other intervertebral disc degeneration, lumbar region: Secondary | ICD-10-CM | POA: Diagnosis not present

## 2022-05-07 DIAGNOSIS — Z9484 Stem cells transplant status: Secondary | ICD-10-CM | POA: Diagnosis not present

## 2022-05-07 DIAGNOSIS — Z01818 Encounter for other preprocedural examination: Secondary | ICD-10-CM | POA: Diagnosis not present

## 2022-05-07 DIAGNOSIS — Z79899 Other long term (current) drug therapy: Secondary | ICD-10-CM | POA: Diagnosis not present

## 2022-05-08 DIAGNOSIS — R9431 Abnormal electrocardiogram [ECG] [EKG]: Secondary | ICD-10-CM | POA: Diagnosis not present

## 2022-05-08 DIAGNOSIS — I499 Cardiac arrhythmia, unspecified: Secondary | ICD-10-CM | POA: Diagnosis not present

## 2022-05-08 DIAGNOSIS — T451X5A Adverse effect of antineoplastic and immunosuppressive drugs, initial encounter: Secondary | ICD-10-CM | POA: Diagnosis not present

## 2022-05-08 DIAGNOSIS — E785 Hyperlipidemia, unspecified: Secondary | ICD-10-CM | POA: Diagnosis not present

## 2022-05-08 DIAGNOSIS — I452 Bifascicular block: Secondary | ICD-10-CM | POA: Diagnosis not present

## 2022-05-08 DIAGNOSIS — C9201 Acute myeloblastic leukemia, in remission: Secondary | ICD-10-CM | POA: Diagnosis not present

## 2022-05-08 DIAGNOSIS — F419 Anxiety disorder, unspecified: Secondary | ICD-10-CM | POA: Diagnosis not present

## 2022-05-08 DIAGNOSIS — E119 Type 2 diabetes mellitus without complications: Secondary | ICD-10-CM | POA: Diagnosis not present

## 2022-05-08 DIAGNOSIS — E876 Hypokalemia: Secondary | ICD-10-CM | POA: Diagnosis not present

## 2022-05-08 DIAGNOSIS — D6481 Anemia due to antineoplastic chemotherapy: Secondary | ICD-10-CM | POA: Diagnosis not present

## 2022-05-08 DIAGNOSIS — Z87891 Personal history of nicotine dependence: Secondary | ICD-10-CM | POA: Diagnosis not present

## 2022-05-08 DIAGNOSIS — I10 Essential (primary) hypertension: Secondary | ICD-10-CM | POA: Diagnosis not present

## 2022-05-08 DIAGNOSIS — K047 Periapical abscess without sinus: Secondary | ICD-10-CM | POA: Diagnosis not present

## 2022-05-08 DIAGNOSIS — Z978 Presence of other specified devices: Secondary | ICD-10-CM | POA: Diagnosis not present

## 2022-05-08 DIAGNOSIS — C9200 Acute myeloblastic leukemia, not having achieved remission: Secondary | ICD-10-CM | POA: Diagnosis not present

## 2022-05-08 DIAGNOSIS — Z5111 Encounter for antineoplastic chemotherapy: Secondary | ICD-10-CM | POA: Diagnosis not present

## 2022-05-12 ENCOUNTER — Telehealth: Payer: Self-pay

## 2022-05-12 DIAGNOSIS — Z01818 Encounter for other preprocedural examination: Secondary | ICD-10-CM | POA: Diagnosis not present

## 2022-05-12 DIAGNOSIS — C9200 Acute myeloblastic leukemia, not having achieved remission: Secondary | ICD-10-CM | POA: Diagnosis not present

## 2022-05-12 DIAGNOSIS — R109 Unspecified abdominal pain: Secondary | ICD-10-CM | POA: Diagnosis not present

## 2022-05-12 DIAGNOSIS — C9201 Acute myeloblastic leukemia, in remission: Secondary | ICD-10-CM | POA: Diagnosis not present

## 2022-05-12 NOTE — Transitions of Care (Post Inpatient/ED Visit) (Signed)
   05/12/2022  Name: Amanda [name] MRN: [id number] DOB: [birth year]  Today's TOC FU Call Status: Today's TOC FU Call Status:: Successful TOC FU Call Competed TOC FU Call Complete Date: 05/12/22  Transition Care Management Follow-up Telephone Call Date of Discharge: 05/11/22 Discharge Facility: Other ([facility] Facility) Name of Other ([facility]) Discharge Facility: [facility] Type of Discharge: Inpatient Admission Primary Inpatient Discharge Diagnosis:: acute myeloid leukemia How have you been since you were released from the hospital?: Better (I feel better that I know what is wrong with me) Any questions or concerns?: No  Items Reviewed: Did you receive and understand the discharge instructions provided?: Yes Medications obtained and verified?: Yes (Medications Reviewed) (declines full medication reconciliation as she is at follow up with [facility] infusion) Any new allergies since your discharge?: No Dietary orders reviewed?: NA Do you have support at home?: Yes People in Home: spouse Name of Support/Comfort Primary Source: husband [facility] and Equipment/Supplies: Were Home Health Services Ordered?: NA Any new equipment or medical supplies ordered?: NA  Functional Questionnaire: Do you need assistance with bathing/showering or dressing?: No Do you need assistance with meal preparation?: No Do you need assistance with eating?: No Do you have difficulty maintaining continence: No Do you need assistance with getting out of bed/getting out of a chair/moving?: No Do you have difficulty managing or taking your medications?: No  Follow up appointments reviewed: PCP Follow-up appointment confirmed?: NA (declines follow up with PCP as she is going to specialist frequently for follow up) Specialist Hospital Follow-up appointment confirmed?: Yes Date of Specialist follow-up appointment?: 05/19/22 (Also seen today 05/12/22 at infusion at [facility]) Follow-Up Specialty Provider::  Dr. Lowella Bandy Do you need transportation to your follow-up appointment?: No Do you understand care options if your condition(s) worsen?: Yes-patient verbalized understanding  SDOH Interventions Today    Flowsheet Row Most Recent Value  SDOH Interventions   Food Insecurity Interventions Intervention Not Indicated  Transportation Interventions Intervention Not Indicated      TOC Interventions Today    Flowsheet Row Most Recent Value  TOC Interventions   TOC Interventions Discussed/Reviewed TOC Interventions Discussed, S/S of infection       Interventions Today    Flowsheet Row Most Recent Value  General Interventions   General Interventions Discussed/Reviewed General Interventions Discussed, Doctor Visits  Doctor Visits Discussed/Reviewed Doctor Visits Discussed, PCP, Specialist  PCP/Specialist Visits Compliance with follow-up visit  Pharmacy Interventions   Pharmacy Dicussed/Reviewed Pharmacy Topics Discussed        SIGNATURE Dudley Major RN, BSN,CCM, CDE Care Management Coordinator [facility] Care Management [phone]

## 2022-05-15 DIAGNOSIS — C9201 Acute myeloblastic leukemia, in remission: Secondary | ICD-10-CM | POA: Diagnosis not present

## 2022-05-15 DIAGNOSIS — Z01818 Encounter for other preprocedural examination: Secondary | ICD-10-CM | POA: Diagnosis not present

## 2022-05-15 DIAGNOSIS — C9200 Acute myeloblastic leukemia, not having achieved remission: Secondary | ICD-10-CM | POA: Diagnosis not present

## 2022-05-15 DIAGNOSIS — R109 Unspecified abdominal pain: Secondary | ICD-10-CM | POA: Diagnosis not present

## 2022-05-19 DIAGNOSIS — D61810 Antineoplastic chemotherapy induced pancytopenia: Secondary | ICD-10-CM | POA: Diagnosis not present

## 2022-05-19 DIAGNOSIS — C9200 Acute myeloblastic leukemia, not having achieved remission: Secondary | ICD-10-CM | POA: Diagnosis not present

## 2022-05-19 DIAGNOSIS — R0781 Pleurodynia: Secondary | ICD-10-CM | POA: Diagnosis not present

## 2022-05-19 DIAGNOSIS — D701 Agranulocytosis secondary to cancer chemotherapy: Secondary | ICD-10-CM | POA: Diagnosis not present

## 2022-05-19 DIAGNOSIS — T451X5A Adverse effect of antineoplastic and immunosuppressive drugs, initial encounter: Secondary | ICD-10-CM | POA: Diagnosis not present

## 2022-05-19 DIAGNOSIS — M25511 Pain in right shoulder: Secondary | ICD-10-CM | POA: Diagnosis not present

## 2022-05-19 DIAGNOSIS — R1011 Right upper quadrant pain: Secondary | ICD-10-CM | POA: Diagnosis not present

## 2022-05-19 DIAGNOSIS — R109 Unspecified abdominal pain: Secondary | ICD-10-CM | POA: Diagnosis not present

## 2022-05-19 DIAGNOSIS — C9201 Acute myeloblastic leukemia, in remission: Secondary | ICD-10-CM | POA: Diagnosis not present

## 2022-05-19 DIAGNOSIS — Z01818 Encounter for other preprocedural examination: Secondary | ICD-10-CM | POA: Diagnosis not present

## 2022-05-21 DIAGNOSIS — C9201 Acute myeloblastic leukemia, in remission: Secondary | ICD-10-CM | POA: Diagnosis not present

## 2022-05-21 DIAGNOSIS — C9200 Acute myeloblastic leukemia, not having achieved remission: Secondary | ICD-10-CM | POA: Diagnosis not present

## 2022-05-21 DIAGNOSIS — Z01818 Encounter for other preprocedural examination: Secondary | ICD-10-CM | POA: Diagnosis not present

## 2022-05-22 DIAGNOSIS — C9201 Acute myeloblastic leukemia, in remission: Secondary | ICD-10-CM | POA: Diagnosis not present

## 2022-05-23 DIAGNOSIS — D171 Benign lipomatous neoplasm of skin and subcutaneous tissue of trunk: Secondary | ICD-10-CM | POA: Diagnosis not present

## 2022-05-23 DIAGNOSIS — D61818 Other pancytopenia: Secondary | ICD-10-CM | POA: Diagnosis not present

## 2022-05-23 DIAGNOSIS — R0781 Pleurodynia: Secondary | ICD-10-CM | POA: Diagnosis not present

## 2022-05-23 DIAGNOSIS — F39 Unspecified mood [affective] disorder: Secondary | ICD-10-CM | POA: Diagnosis not present

## 2022-05-23 DIAGNOSIS — D709 Neutropenia, unspecified: Secondary | ICD-10-CM | POA: Diagnosis not present

## 2022-05-23 DIAGNOSIS — C9201 Acute myeloblastic leukemia, in remission: Secondary | ICD-10-CM | POA: Diagnosis not present

## 2022-05-23 DIAGNOSIS — F32A Depression, unspecified: Secondary | ICD-10-CM | POA: Diagnosis not present

## 2022-05-23 DIAGNOSIS — M79646 Pain in unspecified finger(s): Secondary | ICD-10-CM | POA: Diagnosis not present

## 2022-05-23 DIAGNOSIS — D179 Benign lipomatous neoplasm, unspecified: Secondary | ICD-10-CM | POA: Diagnosis not present

## 2022-05-23 DIAGNOSIS — E119 Type 2 diabetes mellitus without complications: Secondary | ICD-10-CM | POA: Diagnosis not present

## 2022-05-23 DIAGNOSIS — J45909 Unspecified asthma, uncomplicated: Secondary | ICD-10-CM | POA: Diagnosis not present

## 2022-05-23 DIAGNOSIS — D61810 Antineoplastic chemotherapy induced pancytopenia: Secondary | ICD-10-CM | POA: Diagnosis not present

## 2022-05-23 DIAGNOSIS — K5900 Constipation, unspecified: Secondary | ICD-10-CM | POA: Diagnosis not present

## 2022-05-23 DIAGNOSIS — F419 Anxiety disorder, unspecified: Secondary | ICD-10-CM | POA: Diagnosis not present

## 2022-05-23 DIAGNOSIS — K219 Gastro-esophageal reflux disease without esophagitis: Secondary | ICD-10-CM | POA: Diagnosis not present

## 2022-05-23 DIAGNOSIS — L03011 Cellulitis of right finger: Secondary | ICD-10-CM | POA: Diagnosis not present

## 2022-05-23 DIAGNOSIS — M79644 Pain in right finger(s): Secondary | ICD-10-CM | POA: Diagnosis not present

## 2022-05-23 DIAGNOSIS — I440 Atrioventricular block, first degree: Secondary | ICD-10-CM | POA: Diagnosis not present

## 2022-05-23 DIAGNOSIS — I4510 Unspecified right bundle-branch block: Secondary | ICD-10-CM | POA: Diagnosis not present

## 2022-05-28 DIAGNOSIS — C9201 Acute myeloblastic leukemia, in remission: Secondary | ICD-10-CM | POA: Diagnosis not present

## 2022-05-28 DIAGNOSIS — Z01818 Encounter for other preprocedural examination: Secondary | ICD-10-CM | POA: Diagnosis not present

## 2022-05-29 DIAGNOSIS — C9201 Acute myeloblastic leukemia, in remission: Secondary | ICD-10-CM | POA: Diagnosis not present

## 2022-05-29 DIAGNOSIS — R0602 Shortness of breath: Secondary | ICD-10-CM | POA: Diagnosis not present

## 2022-05-30 ENCOUNTER — Other Ambulatory Visit: Payer: Self-pay | Admitting: Internal Medicine

## 2022-05-31 ENCOUNTER — Other Ambulatory Visit: Payer: Self-pay | Admitting: Internal Medicine

## 2022-06-02 DIAGNOSIS — C9201 Acute myeloblastic leukemia, in remission: Secondary | ICD-10-CM | POA: Diagnosis not present

## 2022-06-02 DIAGNOSIS — C9200 Acute myeloblastic leukemia, not having achieved remission: Secondary | ICD-10-CM | POA: Diagnosis not present

## 2022-06-02 DIAGNOSIS — Z01818 Encounter for other preprocedural examination: Secondary | ICD-10-CM | POA: Diagnosis not present

## 2022-06-04 ENCOUNTER — Encounter: Payer: Self-pay | Admitting: Internal Medicine

## 2022-06-05 ENCOUNTER — Other Ambulatory Visit: Payer: Self-pay

## 2022-06-05 DIAGNOSIS — Z09 Encounter for follow-up examination after completed treatment for conditions other than malignant neoplasm: Secondary | ICD-10-CM | POA: Diagnosis not present

## 2022-06-05 DIAGNOSIS — Z01818 Encounter for other preprocedural examination: Secondary | ICD-10-CM | POA: Diagnosis not present

## 2022-06-05 DIAGNOSIS — C9200 Acute myeloblastic leukemia, not having achieved remission: Secondary | ICD-10-CM | POA: Diagnosis not present

## 2022-06-05 DIAGNOSIS — C9201 Acute myeloblastic leukemia, in remission: Secondary | ICD-10-CM | POA: Diagnosis not present

## 2022-06-05 MED ORDER — ATENOLOL 50 MG PO TABS: 50.0000 mg | ORAL_TABLET | Freq: Every day | ORAL | 0 refills | Status: DC

## 2022-06-09 ENCOUNTER — Encounter: Payer: Self-pay | Admitting: Internal Medicine

## 2022-06-09 DIAGNOSIS — C9200 Acute myeloblastic leukemia, not having achieved remission: Secondary | ICD-10-CM | POA: Diagnosis not present

## 2022-06-09 DIAGNOSIS — Z01818 Encounter for other preprocedural examination: Secondary | ICD-10-CM | POA: Diagnosis not present

## 2022-06-09 DIAGNOSIS — C9201 Acute myeloblastic leukemia, in remission: Secondary | ICD-10-CM | POA: Diagnosis not present

## 2022-06-09 NOTE — Progress Notes (Unsigned)
Subjective:    Patient ID: Amanda [name], female    DOB: 1965-09-01, 57 y.o.   MRN: [id number]     HPI Amanda [name] is here for follow up from the hospital.   [name] - 02/02/22 - presented with pleuritic CP, anemia, low platelets, acute hypoxic resp failure from PNA.  Found to have AML.  Transferred to [facility] and was admitted 4/18 - 4/21.  Started on [place].  Started on cefpodoxime, diflucan, acyclovir.  Has been getting treatments since then.  D/c'd in remission.   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  Current Outpatient Medications on File Prior to Visit  Medication Sig Dispense Refill   acetaminophen (TYLENOL) 325 MG tablet Take 2 tablets (650 mg total) by mouth every 6 (six) hours as needed for mild pain (or Fever >/= 101).     albuterol (VENTOLIN HFA) 108 (90 Base) MCG/ACT inhaler INHALE 2 PUFFS BY MOUTH EVERY 4 HOURS AS NEEDED FOR WHEEZING (Patient taking differently: Inhale 2 puffs into the lungs every 4 (four) hours as needed. INHALE 2 PUFFS BY MOUTH EVERY 4 HOURS AS NEEDED FOR WHEEZING) 18 g 5   arformoterol (BROVANA) 15 MCG/2ML NEBU Take 2 mLs (15 mcg total) by nebulization 2 (two) times daily. 120 mL    atenolol (TENORMIN) 50 MG tablet Take 1 tablet (50 mg total) by mouth daily. 30 tablet 0   budesonide (PULMICORT) 0.5 MG/2ML nebulizer solution Take 2 mLs (0.5 mg total) by nebulization 2 (two) times daily.  12   clonazePAM (KLONOPIN) 0.5 MG tablet TAKE 1 TO 1 AND 1/2 TABLETS BY MOUTH EVERY 8 TO 12 HOURS AS NEEDED 30 tablet 1   cyanocobalamin 1000 MCG tablet Take 1 tablet (1,000 mcg total) by mouth daily.     famotidine (PEPCID) 20 MG tablet Take 20 mg by mouth daily as needed for heartburn or indigestion.     FLUoxetine (PROZAC) 40 MG capsule Take 1 capsule (40 mg total) by mouth daily. 90 capsule 3   HYDROcodone-acetaminophen  (NORCO) 10-325 MG per tablet Take 1 tablet by mouth 5 (five) times daily.      Menthol-Methyl Salicylate (SALONPAS PAIN RELIEF PATCH EX) Apply 1 patch topically as needed (muscle pain in neck).     rosuvastatin (CRESTOR) 5 MG tablet TAKE 1 TABLET BY MOUTH DAILY 30 tablet 0   No current facility-administered medications on file prior to visit.     Review of Systems     Objective:  There were no vitals filed for this visit. BP Readings from Last 3 Encounters:  02/02/22 130/60  06/11/21 120/78  03/14/21 128/80   Wt Readings from Last 3 Encounters:  01/30/22 217 lb (98.4 kg)  06/11/21 216 lb (98 kg)  03/14/21 215 lb (97.5 kg)   There is no height or weight on file to calculate BMI.    Physical Exam     Lab Results  Component Value Date   WBC 25.3 (H) 02/02/2022   HGB 7.8 (L) 02/02/2022   HCT 22.9 (L) 02/02/2022   PLT 28 (LL) 02/02/2022   GLUCOSE 142 (H) 02/02/2022   CHOL 181 06/11/2021   TRIG 133.0 06/11/2021   HDL 54.10 06/11/2021   LDLDIRECT 202.9 11/03/2012   LDLCALC 100 (H) 06/11/2021   ALT 16 01/30/2022   AST 24 01/30/2022   NA 138 02/02/2022   K  3.8 02/02/2022   CL 99 02/02/2022   CREATININE 0.68 02/02/2022   BUN 8 02/02/2022   CO2 29 02/02/2022   TSH 0.48 06/11/2021   INR 1.2 01/30/2022   HGBA1C 6.4 06/11/2021   MICROALBUR 3.0 (H) 12/11/2020     Assessment & Plan:    See Problem List for Assessment and Plan of chronic medical problems.

## 2022-06-10 ENCOUNTER — Ambulatory Visit (INDEPENDENT_AMBULATORY_CARE_PROVIDER_SITE_OTHER): Payer: BC Managed Care – PPO | Admitting: Internal Medicine

## 2022-06-10 VITALS — BP 132/68 | HR 67 | Temp 97.9°F | Ht 63.0 in | Wt 190.0 lb

## 2022-06-10 DIAGNOSIS — F411 Generalized anxiety disorder: Secondary | ICD-10-CM

## 2022-06-10 DIAGNOSIS — E782 Mixed hyperlipidemia: Secondary | ICD-10-CM

## 2022-06-10 DIAGNOSIS — E119 Type 2 diabetes mellitus without complications: Secondary | ICD-10-CM | POA: Diagnosis not present

## 2022-06-10 DIAGNOSIS — C9200 Acute myeloblastic leukemia, not having achieved remission: Secondary | ICD-10-CM

## 2022-06-10 DIAGNOSIS — F3289 Other specified depressive episodes: Secondary | ICD-10-CM

## 2022-06-10 NOTE — Assessment & Plan Note (Signed)
Chronic  Lab Results  Component Value Date   HGBA1C 6.4 06/11/2021   Sugars well controlled Continue lifestyle control

## 2022-06-10 NOTE — Assessment & Plan Note (Signed)
Chronic Continue rosuvastatin 5 mg daily 

## 2022-06-10 NOTE — Patient Instructions (Addendum)
      Medications changes include :   none       Return in about 6 months (around 12/11/2022) for follow up.

## 2022-06-10 NOTE — Assessment & Plan Note (Signed)
Chronic It is controlled-having gone through everything she went through she feels like eventually she will be able to get off her medication, but for now we will continue her current medications Continue clonazepam 0.5 mg twice daily as needed Increase prozac to 20 mg daily

## 2022-06-10 NOTE — Assessment & Plan Note (Signed)
Chronic Controlled Was on 40 mg daily, but is currently taking 20 mg daily of the fluoxetine-feels like this is sufficient and eventually think she will be able to get off of the medication, especially having gone everything she went through Continue fluoxetine 20 mg daily

## 2022-06-10 NOTE — Assessment & Plan Note (Signed)
Diagnosed 01/2022 Receiving treatment at [facility] Has completed 3 cycles of chemotherapy To go for bone marrow transplant next month

## 2022-06-12 DIAGNOSIS — C9201 Acute myeloblastic leukemia, in remission: Secondary | ICD-10-CM | POA: Diagnosis not present

## 2022-06-12 DIAGNOSIS — C9200 Acute myeloblastic leukemia, not having achieved remission: Secondary | ICD-10-CM | POA: Diagnosis not present

## 2022-06-12 DIAGNOSIS — Z01818 Encounter for other preprocedural examination: Secondary | ICD-10-CM | POA: Diagnosis not present

## 2022-06-13 ENCOUNTER — Other Ambulatory Visit: Payer: Self-pay | Admitting: Internal Medicine

## 2022-06-17 DIAGNOSIS — C9200 Acute myeloblastic leukemia, not having achieved remission: Secondary | ICD-10-CM | POA: Diagnosis not present

## 2022-06-17 DIAGNOSIS — Z01818 Encounter for other preprocedural examination: Secondary | ICD-10-CM | POA: Diagnosis not present

## 2022-06-17 DIAGNOSIS — C9201 Acute myeloblastic leukemia, in remission: Secondary | ICD-10-CM | POA: Diagnosis not present

## 2022-06-24 DIAGNOSIS — C9201 Acute myeloblastic leukemia, in remission: Secondary | ICD-10-CM | POA: Diagnosis not present

## 2022-06-24 DIAGNOSIS — Z006 Encounter for examination for normal comparison and control in clinical research program: Secondary | ICD-10-CM | POA: Diagnosis not present

## 2022-06-24 DIAGNOSIS — Z01818 Encounter for other preprocedural examination: Secondary | ICD-10-CM | POA: Diagnosis not present

## 2022-06-24 DIAGNOSIS — C9200 Acute myeloblastic leukemia, not having achieved remission: Secondary | ICD-10-CM | POA: Diagnosis not present

## 2022-06-25 DIAGNOSIS — Z9484 Stem cells transplant status: Secondary | ICD-10-CM | POA: Diagnosis not present

## 2022-06-25 DIAGNOSIS — Z79891 Long term (current) use of opiate analgesic: Secondary | ICD-10-CM | POA: Diagnosis not present

## 2022-06-25 DIAGNOSIS — K5900 Constipation, unspecified: Secondary | ICD-10-CM | POA: Diagnosis not present

## 2022-06-25 DIAGNOSIS — G8929 Other chronic pain: Secondary | ICD-10-CM | POA: Diagnosis not present

## 2022-06-25 DIAGNOSIS — R5081 Fever presenting with conditions classified elsewhere: Secondary | ICD-10-CM | POA: Diagnosis not present

## 2022-06-25 DIAGNOSIS — Z79899 Other long term (current) drug therapy: Secondary | ICD-10-CM | POA: Diagnosis not present

## 2022-06-25 DIAGNOSIS — Z5111 Encounter for antineoplastic chemotherapy: Secondary | ICD-10-CM | POA: Diagnosis not present

## 2022-06-25 DIAGNOSIS — C9200 Acute myeloblastic leukemia, not having achieved remission: Secondary | ICD-10-CM | POA: Diagnosis not present

## 2022-06-25 DIAGNOSIS — D61810 Antineoplastic chemotherapy induced pancytopenia: Secondary | ICD-10-CM | POA: Diagnosis not present

## 2022-06-25 DIAGNOSIS — M542 Cervicalgia: Secondary | ICD-10-CM | POA: Diagnosis not present

## 2022-06-25 DIAGNOSIS — Z95828 Presence of other vascular implants and grafts: Secondary | ICD-10-CM | POA: Diagnosis not present

## 2022-06-25 DIAGNOSIS — R509 Fever, unspecified: Secondary | ICD-10-CM | POA: Diagnosis not present

## 2022-06-25 DIAGNOSIS — K1231 Oral mucositis (ulcerative) due to antineoplastic therapy: Secondary | ICD-10-CM | POA: Diagnosis not present

## 2022-06-25 DIAGNOSIS — T451X5A Adverse effect of antineoplastic and immunosuppressive drugs, initial encounter: Secondary | ICD-10-CM | POA: Diagnosis not present

## 2022-06-25 DIAGNOSIS — I10 Essential (primary) hypertension: Secondary | ICD-10-CM | POA: Diagnosis not present

## 2022-06-25 DIAGNOSIS — I493 Ventricular premature depolarization: Secondary | ICD-10-CM | POA: Diagnosis not present

## 2022-06-25 DIAGNOSIS — I472 Ventricular tachycardia, unspecified: Secondary | ICD-10-CM | POA: Diagnosis not present

## 2022-06-25 DIAGNOSIS — C9201 Acute myeloblastic leukemia, in remission: Secondary | ICD-10-CM | POA: Diagnosis not present

## 2022-06-25 DIAGNOSIS — D709 Neutropenia, unspecified: Secondary | ICD-10-CM | POA: Diagnosis not present

## 2022-06-25 DIAGNOSIS — Z4659 Encounter for fitting and adjustment of other gastrointestinal appliance and device: Secondary | ICD-10-CM | POA: Diagnosis not present

## 2022-06-25 DIAGNOSIS — F419 Anxiety disorder, unspecified: Secondary | ICD-10-CM | POA: Diagnosis not present

## 2022-06-25 DIAGNOSIS — F32A Depression, unspecified: Secondary | ICD-10-CM | POA: Diagnosis not present

## 2022-06-25 DIAGNOSIS — I491 Atrial premature depolarization: Secondary | ICD-10-CM | POA: Diagnosis not present

## 2022-06-25 DIAGNOSIS — I452 Bifascicular block: Secondary | ICD-10-CM | POA: Diagnosis not present

## 2022-06-25 DIAGNOSIS — F99 Mental disorder, not otherwise specified: Secondary | ICD-10-CM | POA: Diagnosis not present

## 2022-06-25 DIAGNOSIS — R197 Diarrhea, unspecified: Secondary | ICD-10-CM | POA: Diagnosis not present

## 2022-06-25 DIAGNOSIS — J029 Acute pharyngitis, unspecified: Secondary | ICD-10-CM | POA: Diagnosis not present

## 2022-06-25 DIAGNOSIS — K1230 Oral mucositis (ulcerative), unspecified: Secondary | ICD-10-CM | POA: Diagnosis not present

## 2022-07-18 ENCOUNTER — Telehealth: Payer: Self-pay | Admitting: *Deleted

## 2022-07-18 ENCOUNTER — Encounter: Payer: Self-pay | Admitting: *Deleted

## 2022-07-18 DIAGNOSIS — C9201 Acute myeloblastic leukemia, in remission: Secondary | ICD-10-CM | POA: Diagnosis not present

## 2022-07-18 DIAGNOSIS — Z9484 Stem cells transplant status: Secondary | ICD-10-CM | POA: Diagnosis not present

## 2022-07-18 NOTE — Transitions of Care (Post Inpatient/ED Visit) (Signed)
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  I am tired from being in hospital for so long, but hopeful and glad to be home.  Not having any problems so far") Any questions or concerns?: No  Items Reviewed: Did you receive and understand the discharge instructions provided?: Yes (briefly reviewed with patient who verbalizes good understanding of same - outside hospital AVS) Medications obtained,verified, and reconciled?: No (patient declined- just finished HFU with oncology provider at [facility] and reports all medications were reviewed thoroughly during that visit) Medications Not Reviewed Reasons:: Other: (confirmed patient obtained/ is taking all newly Rx'd medications as instructed; self-manages medications and denies questions/ concerns around medications today) Any new allergies since your discharge?: No Dietary orders reviewed?: Yes Type of Diet Ordered:: "The cancer center put me on a special diet and I am trying to follow that as best as I can" Do you have support at home?: Yes People in Home: spouse Name of Support/Comfort Primary Source: Reports independent in self-care activities; supportive spouse assists as/ if needed/ indicated  Medications Reviewed Today: Medications Reviewed Today     Reviewed by Michaela Corner, RN (Registered Nurse)  on 07/18/22 at 1117  Med List Status: <None>   Medication Order Taking? Sig Documenting Provider Last Dose Status Informant  acyclovir (ZOVIRAX) 400 MG tablet [id number]  Take by mouth. [provider]  Active   albuterol (VENTOLIN HFA) 108 (90 Base) MCG/ACT inhaler [id number] No INHALE 2 PUFFS BY MOUTH EVERY 4 HOURS AS NEEDED FOR WHEEZING  Patient taking differently: Inhale 2 puffs into the lungs every 4 (four) hours as needed. INHALE 2 PUFFS BY MOUTH EVERY 4 HOURS AS NEEDED FOR WHEEZING   Burns, Bobette Mo, MD Taking Active   ascorbic acid (VITAMIN C) 1000 MG tablet [id number] No Take 1 tablet by mouth daily. [provider] Taking Active   atenolol (TENORMIN) 50 MG tablet [id number] No Take 1 tablet (50 mg total) by mouth daily. Pincus Sanes, MD Taking Active   bisacodyl (DULCOLAX) 5 MG EC tablet [id number] No Take by mouth. [provider] Taking Active   clonazePAM (KLONOPIN) 0.5 MG tablet [id number] No TAKE 1 TO 1 AND 1/2 TABLETS BY MOUTH EVERY 8 TO 12 HOURS AS NEEDED Pincus Sanes, MD Taking Active   cyanocobalamin 1000 MCG tablet [id number] No Take 1 tablet (1,000 mcg total) by mouth daily. Littie Deeds, MD Taking Active   famotidine (PEPCID) 20 MG tablet [id number] No Take 20 mg by mouth daily as needed for heartburn or indigestion. [provider] Taking Active   FLUoxetine (PROZAC) 20 MG tablet [id number]  TAKE 1 TABLET(20 MG) BY MOUTH DAILY Burns, Bobette Mo, MD  Active   fluticasone-salmeterol (ADVAIR) 250-50 MCG/ACT AEPB [id number] No Inhale into the lungs. [provider] Taking Active   furosemide (LASIX) 20 MG tablet [id number] No Take 1 tablet by mouth daily. [provider] Taking Active  lidocaine-prilocaine (EMLA) cream [id number] No Apply liberal amount topically to port site one hour prior to accessing [provider] Taking Active   Menthol-Methyl Salicylate [facility] PAIN RELIEF PATCH EX) [id number] No Apply 1 patch topically  as needed (muscle pain in neck). [provider] Taking Active   OLANZapine (ZYPREXA) 5 MG tablet [id number] No Take by mouth. [provider] Taking Active   ondansetron (ZOFRAN) 8 MG tablet [id number] No Take by mouth. [provider] Taking Active   Oxycodone HCl 10 MG TABS [id number] No Take 1 tablet 5 times a day by oral route as needed for 30 days. [provider] Taking Active   rosuvastatin (CRESTOR) 5 MG tablet [id number] No TAKE 1 TABLET BY MOUTH DAILY Burns, Bobette Mo, MD Taking Active   RYDAPT 25 MG CAPS [id number] No Take by mouth. [provider] Taking Active   Specialty Vitamins Products (MG PLUS PROTEIN) 133 MG TABS [id number] No Take by mouth. [provider] Taking Active            Home Care and Equipment/Supplies: Were Home Health Services Ordered?: No Any new equipment or medical supplies ordered?: No  Functional Questionnaire: Do you need assistance with bathing/showering or dressing?: No Do you need assistance with meal preparation?: No Do you need assistance with eating?: No Do you have difficulty maintaining continence: No Do you need assistance with getting out of bed/getting out of a chair/moving?: No Do you have difficulty managing or taking your medications?: Yes (spouse assists as indicated)  Follow up appointments reviewed: PCP Follow-up appointment confirmed?: NA (verified not indicated per hospital discharging provider discharge notes) Specialist Hospital Follow-up appointment confirmed?: Yes (reports has oncology follow up visits twice a week, post-hospital discharge on 07/17/22) Date of Specialist follow-up appointment?: 07/18/22 Follow-Up Specialty Provider:: oncology provider at [facility] Do you need transportation to your follow-up appointment?: No Do you understand care options if your condition(s) worsen?: Yes-patient verbalized understanding  SDOH Interventions Today    Flowsheet Row Most Recent  Value  SDOH Interventions   Food Insecurity Interventions Intervention Not Indicated  Transportation Interventions Intervention Not Indicated  [husband currently providing transportation]      TOC Interventions Today    Flowsheet Row Most Recent Value  TOC Interventions   TOC Interventions Discussed/Reviewed TOC Interventions Discussed  [Patient declines need for ongoing/ further care coordination outreach,  no care coordination needs identified at time of TOC call today,  reports she is well supported by [facility] oncology team- has follow up visits twice weekly post-stem call transplant]      Interventions Today    Flowsheet Row Most Recent Value  Chronic Disease   Chronic disease during today's visit Other  [bone marrow stem cell transplant secondary to AML]  General Interventions   General Interventions Discussed/Reviewed General Interventions Discussed, Doctor Visits, Durable Medical Equipment (DME)  Doctor Visits Discussed/Reviewed Specialist, Doctor Visits Discussed  Durable Medical Equipment (DME) Other  [confirmed not currently requiring/ using assistive devices]  Nutrition Interventions   Nutrition Discussed/Reviewed Nutrition Discussed  Pharmacy Interventions   Pharmacy Dicussed/Reviewed Pharmacy Topics Discussed  Safety Interventions   Safety Discussed/Reviewed Safety Discussed      Caryl Pina, RN, BSN, CCRN Alumnus RN CM Care Coordination/ Transition of Care- [facility] Care Management [phone]: direct office

## 2022-07-22 DIAGNOSIS — C9200 Acute myeloblastic leukemia, not having achieved remission: Secondary | ICD-10-CM | POA: Diagnosis not present

## 2022-07-22 DIAGNOSIS — T451X5A Adverse effect of antineoplastic and immunosuppressive drugs, initial encounter: Secondary | ICD-10-CM | POA: Diagnosis not present

## 2022-07-22 DIAGNOSIS — C9201 Acute myeloblastic leukemia, in remission: Secondary | ICD-10-CM | POA: Diagnosis not present

## 2022-07-22 DIAGNOSIS — D701 Agranulocytosis secondary to cancer chemotherapy: Secondary | ICD-10-CM | POA: Diagnosis not present

## 2022-07-25 DIAGNOSIS — C9200 Acute myeloblastic leukemia, not having achieved remission: Secondary | ICD-10-CM | POA: Diagnosis not present

## 2022-07-25 DIAGNOSIS — Z9484 Stem cells transplant status: Secondary | ICD-10-CM | POA: Diagnosis not present

## 2022-07-25 DIAGNOSIS — Z006 Encounter for examination for normal comparison and control in clinical research program: Secondary | ICD-10-CM | POA: Diagnosis not present

## 2022-07-25 DIAGNOSIS — C9201 Acute myeloblastic leukemia, in remission: Secondary | ICD-10-CM | POA: Diagnosis not present

## 2022-07-29 DIAGNOSIS — C9200 Acute myeloblastic leukemia, not having achieved remission: Secondary | ICD-10-CM | POA: Diagnosis not present

## 2022-07-29 DIAGNOSIS — Z9484 Stem cells transplant status: Secondary | ICD-10-CM | POA: Diagnosis not present

## 2022-07-29 DIAGNOSIS — Z006 Encounter for examination for normal comparison and control in clinical research program: Secondary | ICD-10-CM | POA: Diagnosis not present

## 2022-07-29 DIAGNOSIS — C9201 Acute myeloblastic leukemia, in remission: Secondary | ICD-10-CM | POA: Diagnosis not present

## 2022-07-31 DIAGNOSIS — Z9484 Stem cells transplant status: Secondary | ICD-10-CM | POA: Diagnosis not present

## 2022-07-31 DIAGNOSIS — Z006 Encounter for examination for normal comparison and control in clinical research program: Secondary | ICD-10-CM | POA: Diagnosis not present

## 2022-07-31 DIAGNOSIS — C9201 Acute myeloblastic leukemia, in remission: Secondary | ICD-10-CM | POA: Diagnosis not present

## 2022-07-31 DIAGNOSIS — C9200 Acute myeloblastic leukemia, not having achieved remission: Secondary | ICD-10-CM | POA: Diagnosis not present

## 2022-08-04 ENCOUNTER — Other Ambulatory Visit: Payer: Self-pay | Admitting: Internal Medicine

## 2022-08-04 DIAGNOSIS — F411 Generalized anxiety disorder: Secondary | ICD-10-CM

## 2022-08-04 NOTE — Telephone Encounter (Signed)
MD is out of the office until 7/29. Sending refill to DOD.Marland KitchenRaechel Chute

## 2022-08-05 DIAGNOSIS — C9201 Acute myeloblastic leukemia, in remission: Secondary | ICD-10-CM | POA: Diagnosis not present

## 2022-08-07 DIAGNOSIS — C9201 Acute myeloblastic leukemia, in remission: Secondary | ICD-10-CM | POA: Diagnosis not present

## 2022-08-07 DIAGNOSIS — Z9484 Stem cells transplant status: Secondary | ICD-10-CM | POA: Diagnosis not present

## 2022-08-07 DIAGNOSIS — C9200 Acute myeloblastic leukemia, not having achieved remission: Secondary | ICD-10-CM | POA: Diagnosis not present

## 2022-08-07 DIAGNOSIS — Z006 Encounter for examination for normal comparison and control in clinical research program: Secondary | ICD-10-CM | POA: Diagnosis not present

## 2022-08-12 DIAGNOSIS — C9201 Acute myeloblastic leukemia, in remission: Secondary | ICD-10-CM | POA: Diagnosis not present

## 2022-08-12 DIAGNOSIS — Z9484 Stem cells transplant status: Secondary | ICD-10-CM | POA: Diagnosis not present

## 2022-08-12 DIAGNOSIS — C9200 Acute myeloblastic leukemia, not having achieved remission: Secondary | ICD-10-CM | POA: Diagnosis not present

## 2022-08-12 DIAGNOSIS — Z006 Encounter for examination for normal comparison and control in clinical research program: Secondary | ICD-10-CM | POA: Diagnosis not present

## 2022-08-14 DIAGNOSIS — C9200 Acute myeloblastic leukemia, not having achieved remission: Secondary | ICD-10-CM | POA: Diagnosis not present

## 2022-08-14 DIAGNOSIS — Z006 Encounter for examination for normal comparison and control in clinical research program: Secondary | ICD-10-CM | POA: Diagnosis not present

## 2022-08-14 DIAGNOSIS — C9201 Acute myeloblastic leukemia, in remission: Secondary | ICD-10-CM | POA: Diagnosis not present

## 2022-08-14 DIAGNOSIS — Z9484 Stem cells transplant status: Secondary | ICD-10-CM | POA: Diagnosis not present

## 2022-08-18 ENCOUNTER — Other Ambulatory Visit: Payer: Self-pay | Admitting: Internal Medicine

## 2022-08-19 DIAGNOSIS — Z006 Encounter for examination for normal comparison and control in clinical research program: Secondary | ICD-10-CM | POA: Diagnosis not present

## 2022-08-19 DIAGNOSIS — C9201 Acute myeloblastic leukemia, in remission: Secondary | ICD-10-CM | POA: Diagnosis not present

## 2022-08-19 DIAGNOSIS — Z9484 Stem cells transplant status: Secondary | ICD-10-CM | POA: Diagnosis not present

## 2022-08-19 DIAGNOSIS — C9200 Acute myeloblastic leukemia, not having achieved remission: Secondary | ICD-10-CM | POA: Diagnosis not present

## 2022-08-20 ENCOUNTER — Other Ambulatory Visit: Payer: Self-pay | Admitting: Internal Medicine

## 2022-08-20 DIAGNOSIS — F411 Generalized anxiety disorder: Secondary | ICD-10-CM

## 2022-08-21 DIAGNOSIS — C9201 Acute myeloblastic leukemia, in remission: Secondary | ICD-10-CM | POA: Diagnosis not present

## 2022-08-21 DIAGNOSIS — Z006 Encounter for examination for normal comparison and control in clinical research program: Secondary | ICD-10-CM | POA: Diagnosis not present

## 2022-08-21 DIAGNOSIS — Z9484 Stem cells transplant status: Secondary | ICD-10-CM | POA: Diagnosis not present

## 2022-08-21 DIAGNOSIS — C9200 Acute myeloblastic leukemia, not having achieved remission: Secondary | ICD-10-CM | POA: Diagnosis not present

## 2022-08-26 DIAGNOSIS — Z006 Encounter for examination for normal comparison and control in clinical research program: Secondary | ICD-10-CM | POA: Diagnosis not present

## 2022-08-26 DIAGNOSIS — C9200 Acute myeloblastic leukemia, not having achieved remission: Secondary | ICD-10-CM | POA: Diagnosis not present

## 2022-08-26 DIAGNOSIS — Z9484 Stem cells transplant status: Secondary | ICD-10-CM | POA: Diagnosis not present

## 2022-08-26 DIAGNOSIS — C9201 Acute myeloblastic leukemia, in remission: Secondary | ICD-10-CM | POA: Diagnosis not present

## 2022-08-27 DIAGNOSIS — M5030 Other cervical disc degeneration, unspecified cervical region: Secondary | ICD-10-CM | POA: Diagnosis not present

## 2022-08-27 DIAGNOSIS — Z5181 Encounter for therapeutic drug level monitoring: Secondary | ICD-10-CM | POA: Diagnosis not present

## 2022-08-27 DIAGNOSIS — M5136 Other intervertebral disc degeneration, lumbar region: Secondary | ICD-10-CM | POA: Diagnosis not present

## 2022-08-27 DIAGNOSIS — F32A Depression, unspecified: Secondary | ICD-10-CM | POA: Diagnosis not present

## 2022-08-27 DIAGNOSIS — Z79899 Other long term (current) drug therapy: Secondary | ICD-10-CM | POA: Diagnosis not present

## 2022-08-27 DIAGNOSIS — G894 Chronic pain syndrome: Secondary | ICD-10-CM | POA: Diagnosis not present

## 2022-08-29 DIAGNOSIS — Z9484 Stem cells transplant status: Secondary | ICD-10-CM | POA: Diagnosis not present

## 2022-08-29 DIAGNOSIS — Z006 Encounter for examination for normal comparison and control in clinical research program: Secondary | ICD-10-CM | POA: Diagnosis not present

## 2022-08-29 DIAGNOSIS — C9201 Acute myeloblastic leukemia, in remission: Secondary | ICD-10-CM | POA: Diagnosis not present

## 2022-08-29 DIAGNOSIS — C9200 Acute myeloblastic leukemia, not having achieved remission: Secondary | ICD-10-CM | POA: Diagnosis not present

## 2022-09-02 DIAGNOSIS — C9200 Acute myeloblastic leukemia, not having achieved remission: Secondary | ICD-10-CM | POA: Diagnosis not present

## 2022-09-02 DIAGNOSIS — Z9484 Stem cells transplant status: Secondary | ICD-10-CM | POA: Diagnosis not present

## 2022-09-02 DIAGNOSIS — Z006 Encounter for examination for normal comparison and control in clinical research program: Secondary | ICD-10-CM | POA: Diagnosis not present

## 2022-09-02 DIAGNOSIS — C9201 Acute myeloblastic leukemia, in remission: Secondary | ICD-10-CM | POA: Diagnosis not present

## 2022-09-04 DIAGNOSIS — Z006 Encounter for examination for normal comparison and control in clinical research program: Secondary | ICD-10-CM | POA: Diagnosis not present

## 2022-09-04 DIAGNOSIS — Z9484 Stem cells transplant status: Secondary | ICD-10-CM | POA: Diagnosis not present

## 2022-09-04 DIAGNOSIS — I499 Cardiac arrhythmia, unspecified: Secondary | ICD-10-CM | POA: Diagnosis not present

## 2022-09-04 DIAGNOSIS — C9201 Acute myeloblastic leukemia, in remission: Secondary | ICD-10-CM | POA: Diagnosis not present

## 2022-09-04 DIAGNOSIS — C9200 Acute myeloblastic leukemia, not having achieved remission: Secondary | ICD-10-CM | POA: Diagnosis not present

## 2022-09-10 ENCOUNTER — Encounter: Payer: Self-pay | Admitting: Internal Medicine

## 2022-09-10 DIAGNOSIS — Z87898 Personal history of other specified conditions: Secondary | ICD-10-CM | POA: Diagnosis not present

## 2022-09-11 DIAGNOSIS — C9201 Acute myeloblastic leukemia, in remission: Secondary | ICD-10-CM | POA: Diagnosis not present

## 2022-09-11 DIAGNOSIS — C9200 Acute myeloblastic leukemia, not having achieved remission: Secondary | ICD-10-CM | POA: Diagnosis not present

## 2022-09-11 DIAGNOSIS — Z9484 Stem cells transplant status: Secondary | ICD-10-CM | POA: Diagnosis not present

## 2022-09-11 DIAGNOSIS — I4510 Unspecified right bundle-branch block: Secondary | ICD-10-CM | POA: Diagnosis not present

## 2022-09-13 DIAGNOSIS — I444 Left anterior fascicular block: Secondary | ICD-10-CM | POA: Diagnosis not present

## 2022-09-13 DIAGNOSIS — I4510 Unspecified right bundle-branch block: Secondary | ICD-10-CM | POA: Diagnosis not present

## 2022-09-13 DIAGNOSIS — I452 Bifascicular block: Secondary | ICD-10-CM | POA: Diagnosis not present

## 2022-09-18 DIAGNOSIS — C9200 Acute myeloblastic leukemia, not having achieved remission: Secondary | ICD-10-CM | POA: Diagnosis not present

## 2022-09-18 DIAGNOSIS — C9201 Acute myeloblastic leukemia, in remission: Secondary | ICD-10-CM | POA: Diagnosis not present

## 2022-09-18 DIAGNOSIS — Z9484 Stem cells transplant status: Secondary | ICD-10-CM | POA: Diagnosis not present

## 2022-09-18 DIAGNOSIS — Z006 Encounter for examination for normal comparison and control in clinical research program: Secondary | ICD-10-CM | POA: Diagnosis not present

## 2022-09-25 DIAGNOSIS — C9201 Acute myeloblastic leukemia, in remission: Secondary | ICD-10-CM | POA: Diagnosis not present

## 2022-09-25 DIAGNOSIS — C9200 Acute myeloblastic leukemia, not having achieved remission: Secondary | ICD-10-CM | POA: Diagnosis not present

## 2022-09-25 DIAGNOSIS — Z9484 Stem cells transplant status: Secondary | ICD-10-CM | POA: Diagnosis not present

## 2022-09-29 DIAGNOSIS — Z9484 Stem cells transplant status: Secondary | ICD-10-CM | POA: Diagnosis not present

## 2022-09-29 DIAGNOSIS — C9200 Acute myeloblastic leukemia, not having achieved remission: Secondary | ICD-10-CM | POA: Diagnosis not present

## 2022-09-29 DIAGNOSIS — C9201 Acute myeloblastic leukemia, in remission: Secondary | ICD-10-CM | POA: Diagnosis not present

## 2022-10-01 DIAGNOSIS — I4510 Unspecified right bundle-branch block: Secondary | ICD-10-CM | POA: Diagnosis not present

## 2022-10-02 DIAGNOSIS — C9200 Acute myeloblastic leukemia, not having achieved remission: Secondary | ICD-10-CM | POA: Diagnosis not present

## 2022-10-02 DIAGNOSIS — R002 Palpitations: Secondary | ICD-10-CM | POA: Diagnosis not present

## 2022-10-02 DIAGNOSIS — C9201 Acute myeloblastic leukemia, in remission: Secondary | ICD-10-CM | POA: Diagnosis not present

## 2022-10-02 DIAGNOSIS — Z9484 Stem cells transplant status: Secondary | ICD-10-CM | POA: Diagnosis not present

## 2022-10-09 DIAGNOSIS — C9200 Acute myeloblastic leukemia, not having achieved remission: Secondary | ICD-10-CM | POA: Diagnosis not present

## 2022-10-09 DIAGNOSIS — C9201 Acute myeloblastic leukemia, in remission: Secondary | ICD-10-CM | POA: Diagnosis not present

## 2022-10-09 DIAGNOSIS — Z006 Encounter for examination for normal comparison and control in clinical research program: Secondary | ICD-10-CM | POA: Diagnosis not present

## 2022-10-09 DIAGNOSIS — Z9484 Stem cells transplant status: Secondary | ICD-10-CM | POA: Diagnosis not present

## 2022-10-16 DIAGNOSIS — C9201 Acute myeloblastic leukemia, in remission: Secondary | ICD-10-CM | POA: Diagnosis not present

## 2022-10-16 DIAGNOSIS — R9431 Abnormal electrocardiogram [ECG] [EKG]: Secondary | ICD-10-CM | POA: Diagnosis not present

## 2022-10-16 DIAGNOSIS — C9200 Acute myeloblastic leukemia, not having achieved remission: Secondary | ICD-10-CM | POA: Diagnosis not present

## 2022-10-16 DIAGNOSIS — I452 Bifascicular block: Secondary | ICD-10-CM | POA: Diagnosis not present

## 2022-10-16 DIAGNOSIS — Z9484 Stem cells transplant status: Secondary | ICD-10-CM | POA: Diagnosis not present

## 2022-10-23 DIAGNOSIS — I959 Hypotension, unspecified: Secondary | ICD-10-CM | POA: Diagnosis not present

## 2022-10-23 DIAGNOSIS — C9201 Acute myeloblastic leukemia, in remission: Secondary | ICD-10-CM | POA: Diagnosis not present

## 2022-10-23 DIAGNOSIS — I452 Bifascicular block: Secondary | ICD-10-CM | POA: Diagnosis not present

## 2022-10-23 DIAGNOSIS — C9200 Acute myeloblastic leukemia, not having achieved remission: Secondary | ICD-10-CM | POA: Diagnosis not present

## 2022-10-23 DIAGNOSIS — Z9484 Stem cells transplant status: Secondary | ICD-10-CM | POA: Diagnosis not present

## 2022-10-23 DIAGNOSIS — R942 Abnormal results of pulmonary function studies: Secondary | ICD-10-CM | POA: Diagnosis not present

## 2022-10-23 DIAGNOSIS — G8929 Other chronic pain: Secondary | ICD-10-CM | POA: Diagnosis not present

## 2022-10-23 DIAGNOSIS — D696 Thrombocytopenia, unspecified: Secondary | ICD-10-CM | POA: Diagnosis not present

## 2022-10-23 DIAGNOSIS — Z79624 Long term (current) use of inhibitors of nucleotide synthesis: Secondary | ICD-10-CM | POA: Diagnosis not present

## 2022-10-26 DIAGNOSIS — Z9484 Stem cells transplant status: Secondary | ICD-10-CM | POA: Diagnosis not present

## 2022-10-26 DIAGNOSIS — C9200 Acute myeloblastic leukemia, not having achieved remission: Secondary | ICD-10-CM | POA: Diagnosis not present

## 2022-10-28 DIAGNOSIS — I4510 Unspecified right bundle-branch block: Secondary | ICD-10-CM | POA: Diagnosis not present

## 2022-10-29 DIAGNOSIS — C9200 Acute myeloblastic leukemia, not having achieved remission: Secondary | ICD-10-CM | POA: Diagnosis not present

## 2022-10-29 DIAGNOSIS — Z9484 Stem cells transplant status: Secondary | ICD-10-CM | POA: Diagnosis not present

## 2022-10-29 DIAGNOSIS — C9201 Acute myeloblastic leukemia, in remission: Secondary | ICD-10-CM | POA: Diagnosis not present

## 2022-10-30 DIAGNOSIS — I452 Bifascicular block: Secondary | ICD-10-CM | POA: Diagnosis not present

## 2022-11-06 DIAGNOSIS — C9200 Acute myeloblastic leukemia, not having achieved remission: Secondary | ICD-10-CM | POA: Diagnosis not present

## 2022-11-06 DIAGNOSIS — C9201 Acute myeloblastic leukemia, in remission: Secondary | ICD-10-CM | POA: Diagnosis not present

## 2022-11-06 DIAGNOSIS — Z9484 Stem cells transplant status: Secondary | ICD-10-CM | POA: Diagnosis not present

## 2022-11-06 DIAGNOSIS — Z23 Encounter for immunization: Secondary | ICD-10-CM | POA: Diagnosis not present

## 2022-11-12 DIAGNOSIS — Z9484 Stem cells transplant status: Secondary | ICD-10-CM | POA: Diagnosis not present

## 2022-11-12 DIAGNOSIS — C9201 Acute myeloblastic leukemia, in remission: Secondary | ICD-10-CM | POA: Diagnosis not present

## 2022-11-12 DIAGNOSIS — I452 Bifascicular block: Secondary | ICD-10-CM | POA: Diagnosis not present

## 2022-11-12 DIAGNOSIS — C9200 Acute myeloblastic leukemia, not having achieved remission: Secondary | ICD-10-CM | POA: Diagnosis not present

## 2022-11-13 DIAGNOSIS — I452 Bifascicular block: Secondary | ICD-10-CM | POA: Diagnosis not present

## 2023-09-26 ENCOUNTER — Other Ambulatory Visit: Payer: Self-pay | Admitting: Internal Medicine
